# Patient Record
Sex: Female | Born: 1975 | Race: Black or African American | Hispanic: No | Marital: Single | Smoking: Never smoker
Health system: Southern US, Community
[De-identification: ages and names within clinical notes are randomized; demographics above are authoritative.]

## PROBLEM LIST (undated history)

## (undated) ENCOUNTER — Inpatient Hospital Stay (HOSPITAL_COMMUNITY): Payer: Self-pay

## (undated) DIAGNOSIS — R87629 Unspecified abnormal cytological findings in specimens from vagina: Secondary | ICD-10-CM

## (undated) DIAGNOSIS — I10 Essential (primary) hypertension: Secondary | ICD-10-CM

## (undated) HISTORY — DX: Unspecified abnormal cytological findings in specimens from vagina: R87.629

---

## 1998-04-17 ENCOUNTER — Emergency Department (HOSPITAL_COMMUNITY): Admission: EM | Admit: 1998-04-17 | Discharge: 1998-04-17 | Payer: Self-pay | Admitting: Emergency Medicine

## 2006-03-31 ENCOUNTER — Emergency Department (HOSPITAL_COMMUNITY): Admission: EM | Admit: 2006-03-31 | Discharge: 2006-03-31 | Payer: Self-pay | Admitting: Family Medicine

## 2006-05-14 ENCOUNTER — Ambulatory Visit (HOSPITAL_BASED_OUTPATIENT_CLINIC_OR_DEPARTMENT_OTHER): Admission: RE | Admit: 2006-05-14 | Discharge: 2006-05-14 | Payer: Self-pay | Admitting: Specialist

## 2006-05-14 ENCOUNTER — Encounter (INDEPENDENT_AMBULATORY_CARE_PROVIDER_SITE_OTHER): Payer: Self-pay | Admitting: Specialist

## 2006-10-23 HISTORY — PX: LIPOMA EXCISION: SHX5283

## 2007-03-09 ENCOUNTER — Emergency Department (HOSPITAL_COMMUNITY): Admission: EM | Admit: 2007-03-09 | Discharge: 2007-03-09 | Payer: Self-pay | Admitting: Family Medicine

## 2007-12-04 ENCOUNTER — Emergency Department (HOSPITAL_COMMUNITY): Admission: EM | Admit: 2007-12-04 | Discharge: 2007-12-04 | Payer: Self-pay | Admitting: Family Medicine

## 2008-01-31 ENCOUNTER — Emergency Department (HOSPITAL_COMMUNITY): Admission: EM | Admit: 2008-01-31 | Discharge: 2008-01-31 | Payer: Self-pay | Admitting: Emergency Medicine

## 2008-04-12 ENCOUNTER — Emergency Department (HOSPITAL_COMMUNITY): Admission: EM | Admit: 2008-04-12 | Discharge: 2008-04-12 | Payer: Self-pay | Admitting: Emergency Medicine

## 2008-04-17 ENCOUNTER — Emergency Department (HOSPITAL_COMMUNITY): Admission: EM | Admit: 2008-04-17 | Discharge: 2008-04-17 | Payer: Self-pay | Admitting: Emergency Medicine

## 2008-10-05 ENCOUNTER — Emergency Department (HOSPITAL_COMMUNITY): Admission: EM | Admit: 2008-10-05 | Discharge: 2008-10-06 | Payer: Self-pay | Admitting: Emergency Medicine

## 2009-02-24 ENCOUNTER — Encounter: Admission: RE | Admit: 2009-02-24 | Discharge: 2009-02-24 | Payer: Self-pay | Admitting: Internal Medicine

## 2010-03-24 ENCOUNTER — Emergency Department (HOSPITAL_COMMUNITY): Admission: EM | Admit: 2010-03-24 | Discharge: 2010-03-24 | Payer: Self-pay | Admitting: Emergency Medicine

## 2010-06-04 ENCOUNTER — Emergency Department (HOSPITAL_COMMUNITY): Admission: EM | Admit: 2010-06-04 | Discharge: 2010-06-04 | Payer: Self-pay | Admitting: Emergency Medicine

## 2010-08-10 ENCOUNTER — Encounter: Admission: RE | Admit: 2010-08-10 | Discharge: 2010-08-10 | Payer: Self-pay | Admitting: Internal Medicine

## 2011-01-06 LAB — POCT PREGNANCY, URINE: Preg Test, Ur: NEGATIVE

## 2011-01-06 LAB — URINALYSIS, ROUTINE W REFLEX MICROSCOPIC
Bilirubin Urine: NEGATIVE
Nitrite: NEGATIVE
Specific Gravity, Urine: 1.018 (ref 1.005–1.030)
Urobilinogen, UA: 1 mg/dL (ref 0.0–1.0)
pH: 6 (ref 5.0–8.0)

## 2011-01-06 LAB — URINE MICROSCOPIC-ADD ON

## 2011-03-10 NOTE — Op Note (Signed)
NAMENEMA, OATLEY              ACCOUNT NO.:  [id number]   MEDICAL RECORD NO.:  [id number]          PATIENT TYPE:  AMB   LOCATION:  DSC                          FACILITY:  [facility]   PHYSICIAN:  Earvin Hansen L. Truesdale, M.D.DATE OF BIRTH:  1976-05-13   DATE OF PROCEDURE:  05/14/2006  DATE OF DISCHARGE:                                 OPERATIVE REPORT   A 35 year old lady with enlarging mass around her right forehead area  measuring approximately 4 x 3.5 cm, increased growth, pain and irritation.   PROCEDURE PLANNED:  Excision of the area with plastic reconstruction.   ANESTHESIA:  General.   The patient underwent general anesthesia intubated orally.  Prep was done to  her face, neck and scalp areas with Betadine soap and solution.  Walled off  with sterile towels and drape so as to make a sterile field.  1% Xylocaine  with epinephrine injected locally, a total of 20 cc.  Curvilinear incision  was made over the hair line area.  I was able to then dissect under the  periosteum and then we made a plane between the skin and the mass and  dissected over with the Metzenbaum scissors.  After proper hemostasis, I was  able to use a retractor to lift under to control bleeders, which we  controlled with the Bovie unit or coagulation.  After this irrigation was  done and after being sure it was a good hemostasis, the flaps were then re-  closed with 3-0 Monocryl x2 layers, subdermal suture of 5-0 Monocryl and  then a running subcuticular stitch of 5-0 Monocryl.  I left a small area of  the wound open approximately one-fourth inch laterally to allow any drainage  from within.  A pressure dressing was used using boluses of 4 x 4's, ABDs  and Kerlix and Ace wrap.  She withstood the procedure very well.  Estimated  blood loss less than 50 mL.  Complications none.      Yaakov Guthrie. Shon Hough, M.D.  Electronically Signed     GLT/MEDQ  D:  05/14/2006  T:  05/14/2006  Job:  [id number]

## 2011-07-28 LAB — URINALYSIS, ROUTINE W REFLEX MICROSCOPIC
Glucose, UA: NEGATIVE mg/dL
Nitrite: NEGATIVE
Specific Gravity, Urine: 1.009 (ref 1.005–1.030)
pH: 7 (ref 5.0–8.0)

## 2011-07-28 LAB — POCT PREGNANCY, URINE: Preg Test, Ur: NEGATIVE

## 2011-10-24 NOTE — L&D Delivery Note (Signed)
Delivery Note At 6:00 PM a viable female was delivered via Vaginal, Spontaneous Delivery (Presentation: ROA;  ).  APGAR: 6-8, ; weight:  1857 gms.  Placenta status: intact , .  Cord: 3 vessel  with the following complications: none .  Cord pH: 7.35  Anesthesia: Epidural  Episiotomy: none Lacerations: none Suture Repair: none Est. Blood Loss (mL): 350  Mom to postpartum.  Baby to NICU.  Naryah Clenney A 04/29/2012, 6:18 PM

## 2011-10-25 ENCOUNTER — Encounter: Payer: Self-pay | Admitting: *Deleted

## 2011-10-25 ENCOUNTER — Emergency Department (HOSPITAL_COMMUNITY)
Admission: EM | Admit: 2011-10-25 | Discharge: 2011-10-25 | Disposition: A | Discharge status: Home / Self Care | Payer: BC Managed Care – PPO | Attending: Emergency Medicine | Admitting: Emergency Medicine

## 2011-10-25 DIAGNOSIS — N390 Urinary tract infection, site not specified: Secondary | ICD-10-CM | POA: Insufficient documentation

## 2011-10-25 DIAGNOSIS — M79603 Pain in arm, unspecified: Secondary | ICD-10-CM

## 2011-10-25 DIAGNOSIS — Z331 Pregnant state, incidental: Secondary | ICD-10-CM

## 2011-10-25 DIAGNOSIS — O2340 Unspecified infection of urinary tract in pregnancy, unspecified trimester: Secondary | ICD-10-CM

## 2011-10-25 DIAGNOSIS — M79609 Pain in unspecified limb: Secondary | ICD-10-CM | POA: Insufficient documentation

## 2011-10-25 DIAGNOSIS — O2390 Unspecified genitourinary tract infection in pregnancy, unspecified trimester: Secondary | ICD-10-CM | POA: Insufficient documentation

## 2011-10-25 DIAGNOSIS — R209 Unspecified disturbances of skin sensation: Secondary | ICD-10-CM | POA: Insufficient documentation

## 2011-10-25 DIAGNOSIS — M25519 Pain in unspecified shoulder: Secondary | ICD-10-CM | POA: Insufficient documentation

## 2011-10-25 LAB — BASIC METABOLIC PANEL
BUN: 8 mg/dL (ref 6–23)
CO2: 22 mEq/L (ref 19–32)
Calcium: 9.3 mg/dL (ref 8.4–10.5)
Chloride: 109 mEq/L (ref 96–112)
Creatinine, Ser: 0.71 mg/dL (ref 0.50–1.10)
GFR calc Af Amer: >90 mL/min (ref >90)
GFR calc non Af Amer: >90 mL/min (ref >90)
Glucose, Bld: 98 mg/dL (ref 70–99)
Potassium: 3.8 mEq/L (ref 3.5–5.1)
Sodium: 138 mEq/L (ref 135–145)

## 2011-10-25 LAB — URINE MICROSCOPIC-ADD ON

## 2011-10-25 LAB — DIFFERENTIAL
Basophils Absolute: 0 10*3/uL (ref 0.0–0.1)
Basophils Relative: 0 % (ref 0–1)
Eosinophils Absolute: 0.2 10*3/uL (ref 0.0–0.7)
Eosinophils Relative: 1 % (ref 0–5)
Lymphocytes Relative: 32 % (ref 12–46)
Lymphs Abs: 4.1 10*3/uL — ABNORMAL HIGH (ref 0.7–4.0)
Monocytes Absolute: 0.7 10*3/uL (ref 0.1–1.0)
Monocytes Relative: 6 % (ref 3–12)
Neutro Abs: 7.7 10*3/uL (ref 1.7–7.7)
Neutrophils Relative %: 60 % (ref 43–77)

## 2011-10-25 LAB — URINALYSIS, ROUTINE W REFLEX MICROSCOPIC
Bilirubin Urine: NEGATIVE
Glucose, UA: NEGATIVE mg/dL
Hgb urine dipstick: NEGATIVE
Ketones, ur: NEGATIVE mg/dL
Nitrite: NEGATIVE
Protein, ur: NEGATIVE mg/dL
Specific Gravity, Urine: 1.021 (ref 1.005–1.030)
Urobilinogen, UA: 1 mg/dL (ref 0.0–1.0)
pH: 5.5 (ref 5.0–8.0)

## 2011-10-25 LAB — CBC
HCT: 39.2 % (ref 36.0–46.0)
Hemoglobin: 13.1 g/dL (ref 12.0–15.0)
MCH: 28.9 pg (ref 26.0–34.0)
MCHC: 33.4 g/dL (ref 30.0–36.0)
MCV: 86.5 fL (ref 78.0–100.0)
Platelets: 319 10*3/uL (ref 150–400)
RBC: 4.53 MIL/uL (ref 3.87–5.11)
RDW: 14 % (ref 11.5–15.5)
WBC: 12.8 10*3/uL — ABNORMAL HIGH (ref 4.0–10.5)

## 2011-10-25 LAB — POCT PREGNANCY, URINE: Preg Test, Ur: POSITIVE

## 2011-10-25 MED ORDER — NITROFURANTOIN MONOHYD MACRO 100 MG PO CAPS: 100.0000 mg | ORAL_CAPSULE | Freq: Two times a day (BID) | ORAL | Status: AC

## 2011-10-25 MED ORDER — HYDROCODONE-ACETAMINOPHEN 5-325 MG PO TABS: 1.0000 | ORAL_TABLET | ORAL | Status: AC | PRN

## 2011-10-25 MED ORDER — HYDROCODONE-ACETAMINOPHEN 5-325 MG PO TABS
1.0000 | ORAL_TABLET | Freq: Once | ORAL | Status: AC
  Administered 2011-10-25: 1 via ORAL
  Filled 2011-10-25: qty 1

## 2011-10-25 MED ORDER — IBUPROFEN 800 MG PO TABS
800.0000 mg | ORAL_TABLET | Freq: Once | ORAL | Status: AC
  Administered 2011-10-25: 800 mg via ORAL
  Filled 2011-10-25: qty 1

## 2011-10-25 NOTE — ED Provider Notes (Signed)
History     CSN: [id number]  Arrival date & time 10/25/11  1734   First MD Initiated Contact with Patient 10/25/11 2059      Chief Complaint  Patient presents with  . Tingling  . Numbness  . Extremity Pain    HPI: Patient is a 36 y.o. female presenting with extremity pain.  Extremity Pain This is a new problem. The current episode started in the past 7 days. The problem occurs 2 to 4 times per day. The problem has been gradually worsening. She has tried NSAIDs for the symptoms.  Patient reports one-week history of bilateral hand pain that radiates up to each shoulder and is associated with numbness and tingling. This only happens when she is in a lying position and often wakes her up from sleep. States she has been taking ibuprofen but it does not help. Denies neck pain or recent injury of any kind. States her last menstrual period was 09/12/2011 History reviewed. No pertinent past medical history.  History reviewed. No pertinent past surgical history.  History reviewed. No pertinent family history.  History  Substance Use Topics  . Smoking status: Never Smoker   . Smokeless tobacco: Not on file  . Alcohol Use: No    OB History    Grav Para Term Preterm Abortions TAB SAB Ect Mult Living                  Review of Systems  Constitutional: Negative.   HENT: Negative.   Eyes: Negative.   Respiratory: Negative.   Cardiovascular: Negative.   Gastrointestinal: Negative.   Genitourinary: Negative.   Musculoskeletal: Negative.   Skin: Negative.   Neurological: Negative.   Hematological: Negative.   Psychiatric/Behavioral: Negative.     Allergies  Review of patient's allergies indicates no known allergies.  Home Medications   Current Outpatient Rx  Name Route Sig Dispense Refill  . IBUPROFEN 200 MG PO TABS Oral Take 1,000 mg by mouth every 6 (six) hours as needed. For pain       BP 135/82  Pulse 93  Temp(Src) 98.5 F (36.9 C) (Oral)  Resp 18  SpO2  98%  Physical Exam  Constitutional: She is oriented to person, place, and time. She appears well-developed and well-nourished.  HENT:  Head: Normocephalic and atraumatic.  Eyes: Conjunctivae are normal.  Neck: Neck supple.  Cardiovascular: Normal rate and regular rhythm.   Pulmonary/Chest: Effort normal and breath sounds normal.  Abdominal: Soft. Bowel sounds are normal.  Musculoskeletal: Normal range of motion.       Cervical back: She exhibits normal range of motion, no tenderness and no bony tenderness.  Neurological: She is alert and oriented to person, place, and time. She has normal strength. She displays no tremor and normal reflexes. She displays a negative Romberg sign. Coordination abnormal.  Skin: Skin is warm and dry. No erythema.  Psychiatric: She has a normal mood and affect.    ED Course  Procedures findings and clinical impression discussed with patient. Will plan to treat for UTI, prescribe short course of medication for pain and encourage patient to follow up with her primary care physician for further evaluation of her bilateral upper extremity pain, numbness and tingling and newly diagnosed pregnancy.  Labs Reviewed  CBC - Abnormal; Notable for the following:    WBC 12.8 (*)    All other components within normal limits  DIFFERENTIAL - Abnormal; Notable for the following:    Lymphs Abs 4.1 (*)  All other components within normal limits  URINALYSIS, ROUTINE W REFLEX MICROSCOPIC - Abnormal; Notable for the following:    APPearance CLOUDY (*)    Leukocytes, UA MODERATE (*)    All other components within normal limits  URINE MICROSCOPIC-ADD ON - Abnormal; Notable for the following:    Squamous Epithelial / LPF FEW (*)    Bacteria, UA FEW (*)    All other components within normal limits  POCT PREGNANCY, URINE  BASIC METABOLIC PANEL  POCT PREGNANCY, URINE   No results found.   No diagnosis found.    MDM  UTI Early pregnancy Upper extremity pain,  numbness and tingling      Leanne Chang, NP 10/25/11 2252

## 2011-10-25 NOTE — ED Notes (Signed)
C/o BUE numbness and tingling, shoulders to hands, both directions, L>R, 1st noticed 1 week ago on R, then 2d ago on L, intermittant, worse when lying down, some discomfort, radial brachial and carotid pulses equal and strong, grip strength equal and strong, denies dropping things or weakness, "it does seem a little harder to hold onto/ close hand around smaller things".

## 2011-10-25 NOTE — ED Notes (Addendum)
Pt reports for approx 1 week she has been waking up with numbness and tingling to bilateral hands. States in the last two days she has started to have pain also to her bilateral arms. Pt states this am when she woke up she had nubmness/tingling to toes. Pt states when it first started she would wake up and it take only couple of seconds for sensation to return. States today it took approx .neruro wnl at this time. gcs 15.  Denies any injury.

## 2011-10-26 ENCOUNTER — Encounter (HOSPITAL_COMMUNITY): Payer: Self-pay | Admitting: *Deleted

## 2011-10-26 ENCOUNTER — Inpatient Hospital Stay (HOSPITAL_COMMUNITY): Payer: BC Managed Care – PPO

## 2011-10-26 ENCOUNTER — Inpatient Hospital Stay (HOSPITAL_COMMUNITY)
Admission: AD | Admit: 2011-10-26 | Discharge: 2011-10-26 | Disposition: A | Discharge status: Home / Self Care | Payer: BC Managed Care – PPO | Source: Ambulatory Visit | Attending: Obstetrics & Gynecology | Admitting: Obstetrics & Gynecology

## 2011-10-26 DIAGNOSIS — R109 Unspecified abdominal pain: Secondary | ICD-10-CM | POA: Insufficient documentation

## 2011-10-26 DIAGNOSIS — N390 Urinary tract infection, site not specified: Secondary | ICD-10-CM | POA: Insufficient documentation

## 2011-10-26 DIAGNOSIS — O2340 Unspecified infection of urinary tract in pregnancy, unspecified trimester: Secondary | ICD-10-CM

## 2011-10-26 DIAGNOSIS — O2390 Unspecified genitourinary tract infection in pregnancy, unspecified trimester: Secondary | ICD-10-CM | POA: Insufficient documentation

## 2011-10-26 LAB — CBC
HCT: 39.1 % (ref 36.0–46.0)
MCV: 87.3 fL (ref 78.0–100.0)
Platelets: 312 10*3/uL (ref 150–400)
RBC: 4.48 MIL/uL (ref 3.87–5.11)
RDW: 13.9 % (ref 11.5–15.5)
WBC: 12.4 10*3/uL — ABNORMAL HIGH (ref 4.0–10.5)

## 2011-10-26 LAB — DIFFERENTIAL
Basophils Absolute: 0 10*3/uL (ref 0.0–0.1)
Lymphocytes Relative: 28 % (ref 12–46)
Lymphs Abs: 3.5 10*3/uL (ref 0.7–4.0)
Neutro Abs: 7.9 10*3/uL — ABNORMAL HIGH (ref 1.7–7.7)

## 2011-10-26 LAB — ABO/RH: ABO/RH(D): A POS

## 2011-10-26 LAB — HCG, QUANTITATIVE, PREGNANCY: hCG, Beta Chain, Quant, S: 3627 m[IU]/mL — ABNORMAL HIGH (ref <5)

## 2011-10-26 NOTE — Progress Notes (Signed)
D/C INSTR GIVEN TO PT BY NATALIE, NP- IN TRIAGE

## 2011-10-26 NOTE — Progress Notes (Signed)
irreg menses,   July, none, Aug, Sept , Oct x 2 times, Nov x 3 day, none in Dec

## 2011-10-26 NOTE — ED Provider Notes (Signed)
History     Chief Complaint  Patient presents with  . Abdominal Pain  . Urinary Tract Infection   HPI 36 y.o. G1P0 at unknown EGA with low abd pain. Seen yesterday and diagnosed with UTI, started Macrobid today. No vaginal bleeding.    No past medical history on file.  No past surgical history on file.  No family history on file.  History  Substance Use Topics  . Smoking status: Never Smoker   . Smokeless tobacco: Not on file  . Alcohol Use: No    Allergies: No Known Allergies  No prescriptions prior to admission    Review of Systems  Constitutional: Negative.   Respiratory: Negative.   Cardiovascular: Negative.   Gastrointestinal: Positive for abdominal pain. Negative for nausea, vomiting, diarrhea and constipation.  Genitourinary: Negative for dysuria, urgency, frequency, hematuria and flank pain.       Negative for vaginal bleeding, vaginal discharge, dyspareunia  Musculoskeletal: Negative.   Neurological: Negative.   Psychiatric/Behavioral: Negative.    Physical Exam   Blood pressure 158/89, pulse 105, temperature 98.5 F (36.9 C), resp. rate 20, last menstrual period 09/06/2011.  Physical Exam  Nursing note and vitals reviewed. Constitutional: She is oriented to person, place, and time. She appears well-developed and well-nourished. No distress.  Cardiovascular: Normal rate.   Respiratory: Effort normal.  GI: Soft. There is no tenderness.  Musculoskeletal: Normal range of motion.  Neurological: She is alert and oriented to person, place, and time.  Skin: Skin is warm and dry.  Psychiatric: She has a normal mood and affect.    MAU Course  Procedures Results for orders placed during the hospital encounter of 10/26/11 (from the past 24 hour(s))  CBC     Status: Abnormal   Collection Time   10/26/11  7:05 PM      Component Value Range   WBC 12.4 (*) 4.0 - 10.5 (K/uL)   RBC 4.48  3.87 - 5.11 (MIL/uL)   Hemoglobin 13.4  12.0 - 15.0 (g/dL)   HCT 16.1   09.6 - 04.5 (%)   MCV 87.3  78.0 - 100.0 (fL)   MCH 29.9  26.0 - 34.0 (pg)   MCHC 34.3  30.0 - 36.0 (g/dL)   RDW 40.9  81.1 - 91.4 (%)   Platelets 312  150 - 400 (K/uL)  DIFFERENTIAL     Status: Abnormal   Collection Time   10/26/11  7:05 PM      Component Value Range   Neutrophils Relative 64  43 - 77 (%)   Neutro Abs 7.9 (*) 1.7 - 7.7 (K/uL)   Lymphocytes Relative 28  12 - 46 (%)   Lymphs Abs 3.5  0.7 - 4.0 (K/uL)   Monocytes Relative 7  3 - 12 (%)   Monocytes Absolute 0.9  0.1 - 1.0 (K/uL)   Eosinophils Relative 1  0 - 5 (%)   Eosinophils Absolute 0.2  0.0 - 0.7 (K/uL)   Basophils Relative 0  0 - 1 (%)   Basophils Absolute 0.0  0.0 - 0.1 (K/uL)  HCG, QUANTITATIVE, PREGNANCY     Status: Abnormal   Collection Time   10/26/11  7:05 PM      Component Value Range   hCG, Beta Chain, Quant, S 3627 (*) <5 (mIU/mL)  ABO/RH     Status: Normal   Collection Time   10/26/11  7:05 PM      Component Value Range   ABO/RH(D) A POS  U/S: + IUGS and yolk sac, 5w4 day size, no fetal pole seen  Assessment and Plan  36 y.o. G1P0 at 5.[redacted] weeks EGA Abd pain in early pregnancy, likely related to UTI, encouraged to continue macrobid Urine culture sent Pregnancy verification letter given, start prenatal care ASAP  Braylinn Gulden 10/27/2011, 2:12 AM

## 2011-10-26 NOTE — ED Provider Notes (Signed)
Medical screening examination/treatment/procedure(s) were performed by non-physician practitioner and as supervising physician I was immediately available for consultation/collaboration.  Raeford Razor, MD 10/26/11 1459

## 2011-10-26 NOTE — Progress Notes (Signed)
Seen yesterday at ED, Dx UTI and Pregnant, has taken 1 dose of antibx @ 2p , today, lower abd pain different today, and hurts when walk

## 2011-10-26 NOTE — Progress Notes (Signed)
WHILE PT IN TRIAGE- RE-ASSESSED- SAID NO BLEEDING.  PAIN - 6   . HAD LABS DRAWN.

## 2011-10-26 NOTE — Plan of Care (Signed)
Called x 2 by lab, all lobbies checked without finding pt

## 2011-10-27 NOTE — ED Provider Notes (Signed)
Attestation of Attending Supervision of Advanced Practitioner: Evaluation and management procedures were performed by the PA/NP/CNM/OB Fellow under my supervision/collaboration. Chart reviewed, and agree with management and plan.  Avagail Whittlesey, M.D. 10/27/2011 6:37 AM   

## 2011-10-28 LAB — URINE CULTURE: Culture  Setup Time: 201301040211

## 2011-11-19 ENCOUNTER — Inpatient Hospital Stay (HOSPITAL_COMMUNITY)
Admission: AD | Admit: 2011-11-19 | Discharge: 2011-11-19 | Disposition: A | Discharge status: Home / Self Care | Payer: BC Managed Care – PPO | Source: Ambulatory Visit | Attending: Obstetrics & Gynecology | Admitting: Obstetrics & Gynecology

## 2011-11-19 ENCOUNTER — Inpatient Hospital Stay (HOSPITAL_COMMUNITY): Payer: BC Managed Care – PPO

## 2011-11-19 ENCOUNTER — Encounter (HOSPITAL_COMMUNITY): Payer: Self-pay | Admitting: *Deleted

## 2011-11-19 DIAGNOSIS — R109 Unspecified abdominal pain: Secondary | ICD-10-CM | POA: Insufficient documentation

## 2011-11-19 DIAGNOSIS — O26859 Spotting complicating pregnancy, unspecified trimester: Secondary | ICD-10-CM | POA: Insufficient documentation

## 2011-11-19 DIAGNOSIS — N949 Unspecified condition associated with female genital organs and menstrual cycle: Secondary | ICD-10-CM

## 2011-11-19 DIAGNOSIS — M545 Low back pain, unspecified: Secondary | ICD-10-CM | POA: Insufficient documentation

## 2011-11-19 DIAGNOSIS — O4590 Premature separation of placenta, unspecified, unspecified trimester: Secondary | ICD-10-CM

## 2011-11-19 DIAGNOSIS — O418X90 Other specified disorders of amniotic fluid and membranes, unspecified trimester, not applicable or unspecified: Secondary | ICD-10-CM

## 2011-11-19 LAB — WET PREP, GENITAL
Clue Cells Wet Prep HPF POC: NONE SEEN
Trich, Wet Prep: NONE SEEN

## 2011-11-19 LAB — URINALYSIS, ROUTINE W REFLEX MICROSCOPIC
Bilirubin Urine: NEGATIVE
Glucose, UA: NEGATIVE mg/dL
Hgb urine dipstick: NEGATIVE
Ketones, ur: NEGATIVE mg/dL
Nitrite: NEGATIVE
Specific Gravity, Urine: 1.02 (ref 1.005–1.030)
pH: 7 (ref 5.0–8.0)

## 2011-11-19 MED ORDER — IBUPROFEN 600 MG PO TABS
600.0000 mg | ORAL_TABLET | Freq: Four times a day (QID) | ORAL | Status: DC | PRN
Start: 2011-11-19 — End: 2014-12-16

## 2011-11-19 NOTE — Progress Notes (Signed)
Pt G3 P2, LMP 09/10/2011, having lower abd and back pain and a brownish discharge today.

## 2011-11-19 NOTE — ED Provider Notes (Signed)
History   Deanna Sparks is a 36 y.o. year old G3P2 female at [redacted]w[redacted]d weeks gestation by [place] 10/26/11 who presents to MAU reporting low abd and low back pain and brown discharge. [place] 10/26/11 showed 5.3 week GS w/ YS, but no FP. S<D. Has not had F/U US. She rates her pain 2/10. She reports mild N/V through pregnancy w/out worsening.   CSN: [id number]  Arrival date & time 11/19/11  1947   None     Chief Complaint  Patient presents with  . Abdominal Pain  . Vaginal Discharge    (Consider location/radiation/quality/duration/timing/severity/associated sxs/prior treatment) HPI  Past Medical History  Diagnosis Date  . No pertinent past medical history     Past Surgical History  Procedure Date  . Cesarean section     Family History  Problem Relation Age of Onset  . Hypertension Mother     History  Substance Use Topics  . Smoking status: Never Smoker   . Smokeless tobacco: Not on file  . Alcohol Use: No    OB History    Grav Para Term Preterm Abortions TAB SAB Ect Mult Living   3 2        2       Review of Systems  Constitutional: Negative for fever, chills and appetite change.  Gastrointestinal: Positive for nausea (ongoing N/V of pregnancy), vomiting and abdominal pain. Negative for diarrhea, constipation and abdominal distention.  Genitourinary: Positive for vaginal bleeding. Negative for dysuria, frequency, hematuria, flank pain and vaginal discharge.  :   Allergies  Review of patient's allergies indicates no known allergies.  Home Medications  No current outpatient prescriptions on file.  BP 143/83  Pulse 94  Temp(Src) 98.2 F (36.8 C) (Oral)  Resp 16  Ht 5' 8.5" (1.74 m)  Wt 103.964 kg (229 lb 3.2 oz)  BMI 34.34 kg/m2  LMP 09/06/2011  Physical Exam  Constitutional: She is oriented to person, place, and time. She appears well-developed and well-nourished. No distress.  Cardiovascular: Normal rate.   Pulmonary/Chest: Effort normal.  Abdominal: Soft. Bowel  sounds are normal. She exhibits no distension and no mass. There is tenderness (suprapubic and right groin). There is no rebound and no guarding.  Genitourinary: There is no lesion on the right labia. There is no lesion on the left labia. Uterus is enlarged. Uterus is not tender. Cervix exhibits no motion tenderness, no discharge and no friability. Right adnexum displays no tenderness. Left adnexum displays no tenderness. There is bleeding (scant brown blood in os) around the vagina. No vaginal discharge found.  Neurological: She is alert and oriented to person, place, and time.  Skin: Skin is warm and dry.  Psychiatric: She has a normal mood and affect.    ED Course  Procedures (including critical care time)  Results for orders placed during the hospital encounter of 11/19/11 (from the past 24 hour(s))  URINALYSIS, ROUTINE W REFLEX MICROSCOPIC     Status: Normal   Collection Time   11/19/11  8:03 PM      Component Value Range   Color, Urine YELLOW  YELLOW    APPearance CLEAR  CLEAR    Specific Gravity, Urine 1.020  1.005 - 1.030    pH 7.0  5.0 - 8.0    Glucose, UA NEGATIVE  NEGATIVE (mg/dL)   Hgb urine dipstick NEGATIVE  NEGATIVE    Bilirubin Urine NEGATIVE  NEGATIVE    Ketones, ur NEGATIVE  NEGATIVE (mg/dL)   Protein, ur NEGATIVE  NEGATIVE (  mg/dL)   Urobilinogen, UA 0.2  0.0 - 1.0 (mg/dL)   Nitrite NEGATIVE  NEGATIVE    Leukocytes, UA NEGATIVE  NEGATIVE   WET PREP, GENITAL     Status: Abnormal   Collection Time   11/19/11  8:50 PM      Component Value Range   Yeast, Wet Prep NONE SEEN  NONE SEEN    Trich, Wet Prep NONE SEEN  NONE SEEN    Clue Cells, Wet Prep NONE SEEN  NONE SEEN    WBC, Wet Prep HPF POC FEW (*) NONE SEEN      US Ob Transvaginal  11/19/2011  *RADIOLOGY REPORT*  Clinical Data: Right lower quadrant pain and spotting.  Estimated gestational age by LMP is 10 weeks 0 days.  TRANSVAGINAL OB ULTRASOUND  Technique:  Transvaginal ultrasound was performed for evaluation  of the gestation as well as the maternal uterus and adnexal regions.  Comparison: 10/26/2011  Findings: There is a single intrauterine gestational sac.  The fetal pole and yolk sac are visualized.  Fetal motion and cardiac activity are visualized in real time imaging.  Fetal heart rate is measured at 183 beats per minute.  A small subchorionic hemorrhage is noted inferiorly measuring about 0.8 x 1.7 cm.  Fetal crown-rump length measures 2.3 cm consistent with an estimated gestational age of [redacted] weeks 1 day.  Estimated delivery date is 06/22/2012.  This represent per pre interval growth since the previous comparison study.  Both ovaries are visualized appear symmetrical.  Trace amount of free fluid in the pelvis.  IMPRESSION: Single intrauterine pregnancy.  Estimated gestational age by crown- rump length is 9 weeks 1 day.  Small subchorionic hemorrhage and minimal free pelvic fluid are noted.  Original Report Authenticated By: Marlon Pel, M.D.     1. Subchorionic hemorrhage of placenta   2. Round ligament pain    Will use new EDD 06/22/12.   MDM  D/C home F/U as scheduled at [facility] or MAU PRN Bleeding precautions  Dorathy Kinsman 11/19/2011 10:43 PM

## 2011-11-20 LAB — GC/CHLAMYDIA PROBE AMP, GENITAL
Chlamydia, DNA Probe: NEGATIVE
GC Probe Amp, Genital: NEGATIVE

## 2011-11-26 NOTE — ED Provider Notes (Signed)
Agree with above note.  Deanna Sparks H. 11/26/2011 7:58 PM

## 2011-12-05 ENCOUNTER — Other Ambulatory Visit: Payer: Self-pay

## 2011-12-05 LAB — OB RESULTS CONSOLE HEPATITIS B SURFACE ANTIGEN: Hepatitis B Surface Ag: NEGATIVE

## 2011-12-05 LAB — OB RESULTS CONSOLE ANTIBODY SCREEN: Antibody Screen: NEGATIVE

## 2012-01-04 ENCOUNTER — Other Ambulatory Visit: Payer: Self-pay | Admitting: Obstetrics

## 2012-01-04 DIAGNOSIS — O09529 Supervision of elderly multigravida, unspecified trimester: Secondary | ICD-10-CM

## 2012-01-04 DIAGNOSIS — O139 Gestational [pregnancy-induced] hypertension without significant proteinuria, unspecified trimester: Secondary | ICD-10-CM

## 2012-01-09 ENCOUNTER — Ambulatory Visit (HOSPITAL_COMMUNITY)
Admission: RE | Admit: 2012-01-09 | Discharge: 2012-01-09 | Disposition: A | Discharge status: Home / Self Care | Payer: BC Managed Care – PPO | Source: Ambulatory Visit | Attending: Obstetrics | Admitting: Obstetrics

## 2012-01-09 ENCOUNTER — Other Ambulatory Visit: Payer: Self-pay

## 2012-01-09 DIAGNOSIS — O09529 Supervision of elderly multigravida, unspecified trimester: Secondary | ICD-10-CM | POA: Insufficient documentation

## 2012-01-09 DIAGNOSIS — O10019 Pre-existing essential hypertension complicating pregnancy, unspecified trimester: Secondary | ICD-10-CM | POA: Insufficient documentation

## 2012-01-09 DIAGNOSIS — Z363 Encounter for antenatal screening for malformations: Secondary | ICD-10-CM | POA: Insufficient documentation

## 2012-01-09 DIAGNOSIS — O358XX0 Maternal care for other (suspected) fetal abnormality and damage, not applicable or unspecified: Secondary | ICD-10-CM

## 2012-01-09 DIAGNOSIS — O34219 Maternal care for unspecified type scar from previous cesarean delivery: Secondary | ICD-10-CM | POA: Insufficient documentation

## 2012-01-09 DIAGNOSIS — Z1389 Encounter for screening for other disorder: Secondary | ICD-10-CM | POA: Insufficient documentation

## 2012-01-09 DIAGNOSIS — O139 Gestational [pregnancy-induced] hypertension without significant proteinuria, unspecified trimester: Secondary | ICD-10-CM

## 2012-01-09 NOTE — Progress Notes (Signed)
Genetic Counseling  High-Risk Gestation Note  Appointment Date:  01/09/2012 Referred By: Brock Bad, MD Date of Birth:  [birth year] Partner:  Deanna Sparks    Pregnancy History: G3P2 Estimated Date of Delivery: 06/22/12 Estimated Gestational Age: [redacted]w[redacted]d Attending: Particia Nearing, MD   Deanna Sparks and her partner, Deanna Sparks, were seen for genetic counseling because of a maternal age of 36 y.o. at delivery.     They were counseled regarding maternal age and the association with risk for chromosome conditions due to nondisjunction with aging of the ova.   We reviewed chromosomes, nondisjunction, and the associated 1 in 59 risk for fetal aneuploidy at [redacted]w[redacted]d gestation related to a maternal age of 36 at delivery.  They were counseled that the risk for aneuploidy decreases as gestational age increases, accounting for those pregnancies which spontaneously abort.  We specifically discussed Down syndrome (trisomy 35), trisomies 76 and 22, and sex chromosome aneuploidies (47,XXX and 47,XXY) including the common features and prognoses of each.   We reviewed available screening and diagnostic options.  Regarding screening tests, we discussed the options of Quad screen and ultrasound.  They understand that screening tests are used to modify a patient's a priori risk for aneuploidy, typically based on age.  This estimate provides a pregnancy specific risk assessment. We discussed another type of screening test, noninvasive prenatal testing (NIPT), which utilizes cell free fetal DNA found in the maternal circulation. This test is not diagnostic for chromosome conditions, but can provide information regarding the presence or absence of extra fetal DNA for chromosomes 13, 18 and 21. Thus, it would not identify or rule out all fetal aneuploidy. The reported detection rate is greater than 99% for Trisomy 21, greater than 97% for Trisomy 18, and is approximately 80% (8 out of 10) for Trisomy 13. The false  positive rate is reported to be less than 1% for any of these conditions. We also reviewed the availability of diagnostic options including amniocentesis.  We discussed the risks, limitations, and benefits of each.    After consideration of all the options, and a clear understanding of the newness and limitations of NIPT, Deanna Sparks elected to proceed with cell free fetal DNA testing ([facility]) and ultrasound. The results of [facility] testing will be available in 8-10 days and will be forwarded to your office when we receive them.  She understands that ultrasound and screening cannot rule out all birth defects or genetic syndromes. The patient was advised of this limitation and states she still does not want diagnostic testing at this time.  However, they were counseled that 50-80% of fetuses with Down syndrome and up to 90% of fetuses with trisomies 13 and 18, when well visualized, have detectable anomalies or soft markers by ultrasound.   Deanna Sparks was provided with written information regarding sickle cell anemia (SCA) including the carrier frequency and incidence in the African-American population, the availability of carrier testing and prenatal diagnosis if indicated.  In addition, we discussed that hemoglobinopathies are routinely screened for as part of the [place] newborn screening panel.  She declined hemoglobin electrophoresis today.  Both family histories were reviewed and found to be noncontributory for birth defects, mental retardation, and known genetic conditions. Without further information regarding the provided family history, an accurate genetic risk cannot be calculated. Further genetic counseling is warranted if more information is obtained.  Deanna Sparks denied exposure to environmental toxins or chemical agents. She denied the use of  alcohol, tobacco or street drugs. She denied significant viral illnesses during the course of her pregnancy. Her medical and surgical  histories were contributory for hypertension, for which she is taking Aldomet. Deanna Sparks was also seen for MFM consultation regarding this history. Please see separate note for detailed discussion.    I counseled this couple regarding the above risks and available options.  The approximate face-to-face time with the genetic counselor was 40 minutes.  Quinn Plowman, MS,  Patent attorney

## 2012-01-19 ENCOUNTER — Telehealth (HOSPITAL_COMMUNITY): Payer: Self-pay | Admitting: MS"

## 2012-01-19 NOTE — Telephone Encounter (Signed)
Called Deanna Sparks to discuss her [facility]ony, cell free fetal DNA testing.  We reviewed that these are within normal limits, showing a less than 1 in 10,000 risk for trisomies 21, 18 and 13.  We reviewed that this testing identifies > 99% of pregnancies with trisomy 21, >97% of pregnancies with trisomy 36, and >80% with trisomy 77; the false positive rate is <0.1% for all conditions.  She understands that this testing does not identify all genetic conditions.  All questions were answered to her satisfaction, she was encouraged to call with additional questions or concerns.  Quinn Plowman, MS Patent attorney

## 2012-01-19 NOTE — Progress Notes (Signed)
MFM Note  Deanna Sparks is a 36 year old G3P2 AA female at 16+3 weeks who presents for consultation regarding chronic hypertension in pregnancy and genetic counseling for advanced maternal age. Deanna Sparks states that she has been followed for borderline hypertension but never officially diagnosed and never treated.  At her first prenatal visit her BP was 141/91. At that time, she was started on Aldomet 500 mg every 12 hours. At her next prenatal visit, her BP was 135/89 and today's BP was 126/85.   OB history: G1: 1997, NSVD at 41 weeks, female, 8+5, only problem was early cervical effacement at 7 months which resulted in several weeks at bedrest G2: 1998, primary C/S at 38 weeks, female, 5+1, intrauterine infection and newborn spent several days in NICU G3: current pregnancy, N&V in first trimester  Medical hx: HSV Surgical hx: C/S Medications: Aldomet 500 mg every 12 hours; Acyclovir 500 mg as needed; PNV Allergies: none Social hx: works full time as Lawyer Family hx: denies any congenital malformations and inheritable diseases/conditions  We discussed the potential complications of chronic hypertension and pregnancy for the mother and fetus: exacerbation of hypertension, superimposed preeclampsia, eclampsia, CNS adverse event, fetal growth restriction, preterm delivery, abruption and IUFD. However, in most cases, maternal and fetal outcomes are good.  Assessment: 1) IUP at 16+ weeks 2) Mild chronic hypertension controlled on Aldomet 3) HSV 4) S/P C/S at term 5) AMA - see genetic counseling letter  Recommendations: 1) Baseline renal and liver function tests including 24 hour urine collection for total protein 2) Serial USs for growth 3) Antenatal testing starting at 32 weeks 4) Deliver by 39 weeks 5) Goal: to maintain BPs between 140s-150s/80s-90s 6) Can increase Aldomet to a total of 2000 mgs; then add Labetalol or nifedipine as necessary 7) Observe closely for any s/s of superimposed  preeclampsia  It was a pleasure meeting [name]. Please call with any questions or concerns.  (Face-to-face consultation with patient: 30 min)

## 2012-01-25 ENCOUNTER — Ambulatory Visit (HOSPITAL_COMMUNITY): Payer: BC Managed Care – PPO

## 2012-02-06 ENCOUNTER — Ambulatory Visit (HOSPITAL_COMMUNITY)
Admission: RE | Admit: 2012-02-06 | Discharge: 2012-02-06 | Disposition: A | Discharge status: Home / Self Care | Payer: BC Managed Care – PPO | Source: Ambulatory Visit | Attending: Obstetrics | Admitting: Obstetrics

## 2012-02-06 VITALS — BP 120/79 | HR 98 | Wt 246.0 lb

## 2012-02-06 DIAGNOSIS — O09529 Supervision of elderly multigravida, unspecified trimester: Secondary | ICD-10-CM | POA: Insufficient documentation

## 2012-02-06 DIAGNOSIS — O34219 Maternal care for unspecified type scar from previous cesarean delivery: Secondary | ICD-10-CM | POA: Insufficient documentation

## 2012-02-06 DIAGNOSIS — O10019 Pre-existing essential hypertension complicating pregnancy, unspecified trimester: Secondary | ICD-10-CM | POA: Insufficient documentation

## 2012-02-06 DIAGNOSIS — O358XX0 Maternal care for other (suspected) fetal abnormality and damage, not applicable or unspecified: Secondary | ICD-10-CM

## 2012-03-05 ENCOUNTER — Ambulatory Visit (HOSPITAL_COMMUNITY)
Admission: RE | Admit: 2012-03-05 | Discharge: 2012-03-05 | Disposition: A | Discharge status: Home / Self Care | Payer: BC Managed Care – PPO | Source: Ambulatory Visit | Attending: Obstetrics | Admitting: Obstetrics

## 2012-03-05 VITALS — BP 140/84 | HR 102 | Wt 252.0 lb

## 2012-03-05 DIAGNOSIS — O10019 Pre-existing essential hypertension complicating pregnancy, unspecified trimester: Secondary | ICD-10-CM | POA: Insufficient documentation

## 2012-03-05 DIAGNOSIS — O09529 Supervision of elderly multigravida, unspecified trimester: Secondary | ICD-10-CM | POA: Insufficient documentation

## 2012-03-05 DIAGNOSIS — O34219 Maternal care for unspecified type scar from previous cesarean delivery: Secondary | ICD-10-CM | POA: Insufficient documentation

## 2012-03-11 ENCOUNTER — Inpatient Hospital Stay (HOSPITAL_COMMUNITY)
Admission: AD | Admit: 2012-03-11 | Discharge: 2012-03-11 | Disposition: A | Discharge status: Home / Self Care | Payer: BC Managed Care – PPO | Source: Ambulatory Visit | Attending: Obstetrics & Gynecology | Admitting: Obstetrics & Gynecology

## 2012-03-11 ENCOUNTER — Encounter (HOSPITAL_COMMUNITY): Payer: Self-pay

## 2012-03-11 DIAGNOSIS — N949 Unspecified condition associated with female genital organs and menstrual cycle: Secondary | ICD-10-CM

## 2012-03-11 DIAGNOSIS — O99891 Other specified diseases and conditions complicating pregnancy: Secondary | ICD-10-CM | POA: Insufficient documentation

## 2012-03-11 DIAGNOSIS — R109 Unspecified abdominal pain: Secondary | ICD-10-CM | POA: Insufficient documentation

## 2012-03-11 LAB — WET PREP, GENITAL
Clue Cells Wet Prep HPF POC: NONE SEEN
Trich, Wet Prep: NONE SEEN
Yeast Wet Prep HPF POC: NONE SEEN

## 2012-03-11 LAB — URINALYSIS, ROUTINE W REFLEX MICROSCOPIC
Bilirubin Urine: NEGATIVE
Ketones, ur: NEGATIVE mg/dL
Nitrite: NEGATIVE
Protein, ur: NEGATIVE mg/dL
pH: 6.5 (ref 5.0–8.0)

## 2012-03-11 NOTE — MAU Provider Note (Signed)
Chief Complaint:  Abdominal Pain    First Provider Initiated Contact with Patient 03/11/12 1947      Deanna Sparks is  36 y.o. [id number].  Patient's last menstrual period was 09/06/2011.. [redacted]w[redacted]d  She presents complaining of Abdominal Pain . Onset is described as intermittent and has been present since yesterday. Reports sharp lower abd pain, worse with movement. States Single episode of spotting on tissue after voiding earlier today. Denies recent intercourse. Reports good fetal movement.  Obstetrical/Gynecological History: OB History    Grav Para Term Preterm Abortions TAB SAB Ect Mult Living   3 2 2       2       Past Medical History: Past Medical History  Diagnosis Date  . Genital herpes     last outbreak 12/2011    Past Surgical History: Past Surgical History  Procedure Date  . Cesarean section     Family History: Family History  Problem Relation Age of Onset  . Hypertension Mother   . Anesthesia problems Neg Hx     Social History: History  Substance Use Topics  . Smoking status: Never Smoker   . Smokeless tobacco: Not on file  . Alcohol Use: No    Allergies: No Known Allergies  Prescriptions prior to admission  Medication Sig Dispense Refill  . metoprolol tartrate (LOPRESSOR) 25 MG tablet Take by mouth. Pt unsure of dosage      . Prenatal Vit-Fe Fumarate-FA (PRENATAL MULTIVITAMIN) TABS Take 1 tablet by mouth every morning.         Review of Systems - Negative except what has been reviewed in HPI  Physical Exam   Blood pressure 135/83, pulse 102, temperature 99 F (37.2 C), temperature source Oral, resp. rate 18, height 5\' 8"  (1.727 m), weight 253 lb 6.4 oz (114.941 kg), last menstrual period 09/06/2011, SpO2 100.00%.  General: General appearance - alert, well appearing, and in no distress, oriented to person, place, and time and overweight Mental status - alert, oriented to person, place, and time, normal mood, behavior, speech, dress, motor activity,  and thought processes, affect appropriate to mood Abdomen - gravid, non tender Focused Gynecological Exam: VULVA: normal appearing vulva with no masses, tenderness or lesions, VAGINA: normal appearing vagina with normal color and discharge, no lesions, no active bleeding, old bleeding or blood tinged discharge noted in vault CERVIX: normal appearing cervix without discharge or lesions, closed/thick/posterior FHR: 140, mod variability, Cat I for gest age Toco: no contractions Labs: Recent Results (from the past 24 hour(s))  URINALYSIS, ROUTINE W REFLEX MICROSCOPIC   Collection Time   03/11/12  6:45 PM      Component Value Range   Color, Urine YELLOW  YELLOW    APPearance CLEAR  CLEAR    Specific Gravity, Urine 1.020  1.005 - 1.030    pH 6.5  5.0 - 8.0    Glucose, UA NEGATIVE  NEGATIVE (mg/dL)   Hgb urine dipstick NEGATIVE  NEGATIVE    Bilirubin Urine NEGATIVE  NEGATIVE    Ketones, ur NEGATIVE  NEGATIVE (mg/dL)   Protein, ur NEGATIVE  NEGATIVE (mg/dL)   Urobilinogen, UA 1.0  0.0 - 1.0 (mg/dL)   Nitrite NEGATIVE  NEGATIVE    Leukocytes, UA NEGATIVE  NEGATIVE   WET PREP, GENITAL   Collection Time   03/11/12  7:54 PM      Component Value Range   Yeast Wet Prep HPF POC NONE SEEN  NONE SEEN    Trich, Wet Prep NONE SEEN  NONE SEEN    Clue Cells Wet Prep HPF POC NONE SEEN  NONE SEEN    WBC, Wet Prep HPF POC FEW (*) NONE SEEN     Assessment: Round Ligament Pain  Plan: Discharge home FU at [facility] tomorrow as scheduled  Stassi Fadely E. 03/11/2012,8:03 PM

## 2012-03-11 NOTE — Discharge Instructions (Signed)

## 2012-03-11 NOTE — MAU Note (Signed)
Patient states she has been having abdominal tightening since 1730, has a spot of bright red blood on tissue with wiping after urinating x 3. Reports fetal movement but not as much as usual.

## 2012-03-24 ENCOUNTER — Encounter (HOSPITAL_COMMUNITY): Payer: Self-pay

## 2012-03-24 ENCOUNTER — Inpatient Hospital Stay (HOSPITAL_COMMUNITY)
Admission: AD | Admit: 2012-03-24 | Discharge: 2012-03-24 | Disposition: A | Discharge status: Home / Self Care | Payer: BC Managed Care – PPO | Source: Ambulatory Visit | Attending: Obstetrics | Admitting: Obstetrics

## 2012-03-24 DIAGNOSIS — R109 Unspecified abdominal pain: Secondary | ICD-10-CM | POA: Insufficient documentation

## 2012-03-24 DIAGNOSIS — O4700 False labor before 37 completed weeks of gestation, unspecified trimester: Secondary | ICD-10-CM | POA: Insufficient documentation

## 2012-03-24 LAB — URINALYSIS, ROUTINE W REFLEX MICROSCOPIC
Glucose, UA: NEGATIVE mg/dL
Hgb urine dipstick: NEGATIVE
Ketones, ur: NEGATIVE mg/dL
Protein, ur: NEGATIVE mg/dL
Urobilinogen, UA: 0.2 mg/dL (ref 0.0–1.0)

## 2012-03-24 LAB — WET PREP, GENITAL
Clue Cells Wet Prep HPF POC: NONE SEEN
Trich, Wet Prep: NONE SEEN

## 2012-03-24 LAB — OB RESULTS CONSOLE GBS: GBS: POSITIVE

## 2012-03-24 MED ORDER — LACTATED RINGERS IV SOLN
Freq: Once | INTRAVENOUS | Status: AC
  Administered 2012-03-24: 12:00:00 via INTRAVENOUS

## 2012-03-24 MED ORDER — MAGNESIUM SULFATE 40 G IN LACTATED RINGERS - SIMPLE
2.0000 g/h | Freq: Once | INTRAVENOUS | Status: AC
  Administered 2012-03-24: 2 g/h via INTRAVENOUS
  Filled 2012-03-24: qty 500

## 2012-03-24 MED ORDER — MAGNESIUM SULFATE BOLUS VIA INFUSION
4.0000 g | Freq: Once | INTRAVENOUS | Status: DC
  Filled 2012-03-24: qty 500

## 2012-03-24 MED ORDER — BETAMETHASONE SOD PHOS & ACET 6 (3-3) MG/ML IJ SUSP
12.0000 mg | Freq: Once | INTRAMUSCULAR | Status: AC
  Administered 2012-03-24: 12 mg via INTRAMUSCULAR
  Filled 2012-03-24: qty 2

## 2012-03-24 NOTE — Discharge Instructions (Signed)
CherylPatient's Consent, Request, or Refusal to Transfer TRANSFER CONSENT I __________________________________________________ acknowledge that my medical condition has been evaluated and explained to me by the Emergency Department physician and/or my attending physician who has recommended that I be transferred to the services of Dr. ____________________ at ____________________. The potential benefits of such transfer outweigh the potential risks of not being transferred. These risks have been fully explained to me and I fully understand them. With this knowledge and understanding, I agree and consent to be transferred. __________________________________________ Signature of Patient or legally responsible individual __________________________________________ Date and Time __________________________________________ Witness Signature __________________________________________ Date and Time TRANSFER REQUEST I __________________________________________________ acknowledge that my medical condition has been evaluated and explained to me by the Emergency Department physician and/or my attending physician who has recommended and offered to me further medical examination and treatment. The potential benefits of such further medical examination and treatment as well the potential risks associated with transfer to another facility have been fully explained to me and I fully understand them. In spite of this understanding, I refuse to consent to the further medical examination and treatment which has been offered to me, and request transfer to ____________________. __________________________________________ Signature of Patient or legally responsible individual __________________________________________ Date and Time __________________________________________ Witness Signature  __________________________________________ Date and Time TRANSFER REFUSAL I __________________________________________________  acknowledge that my medical condition has been evaluated and explained to me by the Emergency Department physician and/or my attending physician who has recommended that I be transferred to the services of Dr. ____________________ at ____________________. The potential benefits of such transfer, the potential risks associated with such transfer, and the probable risks of not being transferred have been fully explained to me and I fully understand them. Even though Dr. __________________________________________________ believes it is in my best interest to be transferred, I refuse to be transferred and I request instead to continue receiving treatment at ____________________. __________________________________________ Signature of Patient or legally responsible individual  __________________________________________ Date and Time __________________________________________ Witness Signature  __________________________________________ Date and Time Document Released: 07/04/2001 Document Revised: 09/28/2011 Document Reviewed: 05/28/2008 [facility] Patient Information 2012 [place], [place].

## 2012-03-24 NOTE — MAU Provider Note (Signed)
History     CSN: [id number]  Arrival date and time: 03/24/12 1027   First Provider Initiated Contact with Patient 03/24/12 1056      Chief Complaint  Patient presents with  . Vaginal Discharge  . Abdominal Cramping   HPI Deanna Sparks 36 y.o. [redacted]w[redacted]d  Client comes to MAU today with the complaint of having lots of mucus discharge today.  Was seen in MAU on 03-11-12 and diagnosed with round ligament pain - cervix was closed, long and thick at that visit.  No bleeding, no leaking, no abdominal pain today - feels some movement low near vagina.  Last birth was 14 years ago and both previous pregnancies were full term.  Client has had a previous Cesarean with last birth.  OB History    Grav Para Term Preterm Abortions TAB SAB Ect Mult Living   3 2 2       2       Past Medical History  Diagnosis Date  . Genital herpes     last outbreak 12/2011    Past Surgical History  Procedure Date  . Cesarean section     Family History  Problem Relation Age of Onset  . Hypertension Mother   . Anesthesia problems Neg Hx     History  Substance Use Topics  . Smoking status: Never Smoker   . Smokeless tobacco: Not on file  . Alcohol Use: No    Allergies: No Known Allergies  Prescriptions prior to admission  Medication Sig Dispense Refill  . Cholecalciferol (VITAMIN D) 400 UNITS capsule Take 400 Units by mouth daily.      . metoprolol tartrate (LOPRESSOR) 25 MG tablet Take 25 mg by mouth 2 (two) times daily. Pt unsure of dosage       . Prenatal Vit-Fe Fumarate-FA (PRENATAL MULTIVITAMIN) TABS Take 1 tablet by mouth every morning.         Review of Systems  Gastrointestinal: Negative for nausea, vomiting and abdominal pain.  Genitourinary:       Having thick mucus discharge   Physical Exam   Blood pressure 142/79, pulse 120, temperature 98.2 F (36.8 C), temperature source Oral, resp. rate 18, height 5' 8.5" (1.74 m), weight 255 lb 3.2 oz (115.758 kg), last menstrual period  09/06/2011.  Physical Exam  Nursing note and vitals reviewed. Constitutional: She is oriented to person, place, and time. She appears well-developed and well-nourished.  HENT:  Head: Normocephalic.  Eyes: EOM are normal.  Neck: Neck supple.  GI: Soft. There is no tenderness.       No contractions seen on monitor strip.  Client not reporting contractions.  Genitourinary:       Speculum exam: Vulva - negative Vagina - Small amount of creamy discharge, no odor Cervix - suspect cervix is open on visual exam.  Could initially see a small opening with membranes. No contact bleeding Bimanual exam: Cervix 2-3, 100%, moving lumpy fetal parts felt GC/Chlam, wet prep, GBS done Chaperone present for exam.  Musculoskeletal: Normal range of motion.  Neurological: She is alert and oriented to person, place, and time.  Skin: Skin is warm and dry.  Psychiatric: She has a normal mood and affect.    MAU Course  Procedures  MDM Consult with Dr. Clearance Coots x 2.  Wanted to admit, but NICU MD advises transfer based on NICU census.  Client to be transferred.  Assessment and Plan  Preterm cervical dilatation  Plan Transfer to [facility] MgSO4 started -  4 gm bolus and 2 gm per hour IV of 1000cc LR at 125cc/hr Betamethasone ordered.   Deanna Sparks 03/24/2012, 11:23 AM

## 2012-03-24 NOTE — MAU Note (Signed)
Patient presents with c/o having a lot of mucus discharge onset this morning with mild tightening in abdomen, no history of PTL  Patient is [redacted]w[redacted]d

## 2012-03-25 LAB — GC/CHLAMYDIA PROBE AMP, GENITAL: GC Probe Amp, Genital: NEGATIVE

## 2012-03-27 ENCOUNTER — Inpatient Hospital Stay (HOSPITAL_COMMUNITY)
Admission: AD | Admit: 2012-03-27 | Discharge: 2012-04-13 | DRG: 379 | Disposition: A | Discharge status: Home / Self Care | Payer: BC Managed Care – PPO | Source: Ambulatory Visit | Attending: Obstetrics & Gynecology | Admitting: Obstetrics & Gynecology

## 2012-03-27 ENCOUNTER — Encounter (HOSPITAL_COMMUNITY): Payer: Self-pay | Admitting: *Deleted

## 2012-03-27 DIAGNOSIS — O09529 Supervision of elderly multigravida, unspecified trimester: Secondary | ICD-10-CM | POA: Diagnosis present

## 2012-03-27 DIAGNOSIS — O328XX0 Maternal care for other malpresentation of fetus, not applicable or unspecified: Secondary | ICD-10-CM | POA: Diagnosis present

## 2012-03-27 DIAGNOSIS — O4700 False labor before 37 completed weeks of gestation, unspecified trimester: Principal | ICD-10-CM | POA: Diagnosis present

## 2012-03-27 MED ORDER — VALACYCLOVIR HCL 500 MG PO TABS
500.0000 mg | ORAL_TABLET | Freq: Every day | ORAL | Status: DC
  Administered 2012-03-28 – 2012-04-13 (×17): 500 mg via ORAL
  Filled 2012-03-27 (×19): qty 1

## 2012-03-27 MED ORDER — CHOLECALCIFEROL 10 MCG (400 UNIT) PO TABS
400.0000 [IU] | ORAL_TABLET | Freq: Every day | ORAL | Status: DC
  Administered 2012-03-27 – 2012-04-13 (×18): 400 [IU] via ORAL
  Filled 2012-03-27 (×19): qty 1

## 2012-03-27 MED ORDER — ASPIRIN 81 MG PO CHEW
81.0000 mg | CHEWABLE_TABLET | Freq: Every day | ORAL | Status: DC
  Administered 2012-03-27 – 2012-04-13 (×18): 81 mg via ORAL
  Filled 2012-03-27 (×19): qty 1

## 2012-03-27 MED ORDER — METOPROLOL TARTRATE 25 MG PO TABS
25.0000 mg | ORAL_TABLET | Freq: Two times a day (BID) | ORAL | Status: DC
  Administered 2012-03-27 – 2012-04-13 (×34): 25 mg via ORAL
  Filled 2012-03-27 (×37): qty 1

## 2012-03-27 MED ORDER — DOCUSATE SODIUM 100 MG PO CAPS
100.0000 mg | ORAL_CAPSULE | Freq: Every day | ORAL | Status: DC
  Administered 2012-03-27 – 2012-04-13 (×18): 100 mg via ORAL
  Filled 2012-03-27 (×18): qty 1

## 2012-03-27 MED ORDER — PRENATAL MULTIVITAMIN CH
1.0000 | ORAL_TABLET | Freq: Every day | ORAL | Status: DC
  Administered 2012-03-28 – 2012-04-13 (×17): 1 via ORAL
  Filled 2012-03-27 (×17): qty 1

## 2012-03-27 MED ORDER — CALCIUM CARBONATE ANTACID 500 MG PO CHEW
2.0000 | CHEWABLE_TABLET | ORAL | Status: DC | PRN
  Administered 2012-03-31: 200 mg via ORAL
  Administered 2012-04-10: 400 mg via ORAL
  Filled 2012-03-27: qty 2
  Filled 2012-03-27: qty 1

## 2012-03-27 MED ORDER — FAMOTIDINE 20 MG PO TABS
20.0000 mg | ORAL_TABLET | Freq: Every evening | ORAL | Status: DC | PRN
  Administered 2012-03-27: 20 mg via ORAL
  Filled 2012-03-27: qty 1

## 2012-03-27 MED ORDER — ONDANSETRON 4 MG PO TBDP
4.0000 mg | ORAL_TABLET | Freq: Four times a day (QID) | ORAL | Status: DC | PRN
  Administered 2012-03-27 – 2012-04-13 (×18): 4 mg via ORAL
  Filled 2012-03-27 (×12): qty 1

## 2012-03-27 MED ORDER — ACETAMINOPHEN 325 MG PO TABS
650.0000 mg | ORAL_TABLET | ORAL | Status: DC | PRN
  Administered 2012-04-01 – 2012-04-12 (×8): 650 mg via ORAL
  Filled 2012-03-27 (×9): qty 2

## 2012-03-27 MED ORDER — ZOLPIDEM TARTRATE 10 MG PO TABS: 10.0000 mg | ORAL_TABLET | Freq: Every evening | ORAL | Status: DC | PRN

## 2012-03-27 MED ORDER — PANTOPRAZOLE SODIUM 40 MG PO TBEC
40.0000 mg | DELAYED_RELEASE_TABLET | Freq: Every day | ORAL | Status: DC | PRN
  Administered 2012-03-27 – 2012-04-10 (×3): 40 mg via ORAL
  Filled 2012-03-27 (×3): qty 1

## 2012-03-27 NOTE — H&P (Signed)
Deanna Sparks is a 36 y.o. female presenting for continued hospitalized bedrest. Maternal Medical History:  Reason for admission: Reason for Admission:   [facility]  Initially presented in PTL/footling breech presentation.  She received MgSO4 for tocolysis and a course of steroids.  An U/S showed an AGA fetus, normal AFI.  Prenatal complications: Hypertension.     OB History    Grav Para Term Preterm Abortions TAB SAB Ect Mult Living   3 2 2  0 0 0 0 0 0 2     Past Medical History  Diagnosis Date  . Genital herpes     last outbreak 12/2011   Past Surgical History  Procedure Date  . Cesarean section    Family History: family history includes Hypertension in her mother.  There is no history of Anesthesia problems. Social History:  reports that she has never smoked. She does not have any smokeless tobacco history on file. She reports that she does not drink alcohol or use illicit drugs.  Review of Systems  Constitutional: Negative for fever.  Eyes: Negative for blurred vision.  Respiratory: Negative for shortness of breath.   Gastrointestinal: Negative for nausea and vomiting.  Skin: Negative for rash.  Neurological: Negative for headaches.      Last menstrual period 09/06/2011. Maternal Exam:  Introitus: not evaluated.   Cervix: not evaluated.   Physical Exam  Constitutional: She appears well-developed.    Prenatal labs: ABO, Rh: --/--/A POS (01/03 1905)    Assessment/Plan: 36 y.o. w/an IUP @ [redacted]w[redacted]d.  Threatened PTL/footling breech presentation.  S/P MgSO4 tocolysis/steroids.  H/O previous C/D.  Admit Continue hospitalization, bedrest Offer TDAP ?GDM testing   JACKSON-MOORE,Allison [employer]hotels A 03/27/2012, 5:58 PM

## 2012-03-28 ENCOUNTER — Encounter (HOSPITAL_COMMUNITY): Payer: Self-pay

## 2012-03-28 LAB — CULTURE, BETA STREP (GROUP B ONLY)

## 2012-03-28 NOTE — Progress Notes (Signed)
Patient has denied pain and contractions throughout the shift.

## 2012-03-28 NOTE — Progress Notes (Signed)
Pt sleeping, denies pain

## 2012-03-28 NOTE — Progress Notes (Signed)
Patient ID: Deanna Sparks, female   DOB: 02-Nov-1975, 36 y.o.   MRN: [id number] Hospital Day: 2  S: Preterm labor symptoms: pelvic pressure  O: Blood pressure 117/76, pulse 87, temperature 98.1 F (36.7 C), temperature source Axillary, resp. rate 18, last menstrual period 09/06/2011, SpO2 100.00%.   XBM:WUXLKGMW: 150 bpm Toco: occasional mild UC's SVE:   A/P- 35 y.o. admitted with:  Pelvic and preterm cervical dilation and effacement at 27 weeks.  Presentation is footling breech.  Patient transferred to [facility] because of NICU status here.  Patient stabilized on Magnesium Sulfate and is now back at [facility] for further management.  Currently stable.  Will continue bedrest.    Pregnancy Complications: preterm labor   Preterm labor management: bedrest advised Dating:  [redacted]w[redacted]d PNL Needed:  none FWB:  good PTL:  stable

## 2012-03-28 NOTE — Progress Notes (Signed)
Patient ID: Deanna Sparks, female   DOB: 1976/08/06, 36 y.o.   MRN: [id number]

## 2012-03-29 MED ORDER — TETANUS-DIPHTH-ACELL PERTUSSIS 5-2.5-18.5 LF-MCG/0.5 IM SUSP
0.5000 mL | Freq: Once | INTRAMUSCULAR | Status: AC
  Administered 2012-03-29: 0.5 mL via INTRAMUSCULAR

## 2012-03-29 NOTE — Progress Notes (Signed)
UR Chart review completed.  

## 2012-03-29 NOTE — Progress Notes (Signed)
Patient ID: Deanna Sparks, female   DOB: 04-09-1976, 36 y.o.   MRN: [id number] Hospital Day: 3  S: Preterm labor symptoms: pelvic pressure  O: Blood pressure 126/83, pulse 98, temperature 98.8 F (37.1 C), temperature source Oral, resp. rate 19, height 5\' 8"  (1.727 m), weight 115.667 kg (255 lb), last menstrual period 09/06/2011, SpO2 97.00%.   ION:GEXBMWUX: 150 bpm Toco: None SVE:   omitted  A/P- 36 y.o. admitted with:  Preterm cervical dilatation.  Footling breech.  Stable after Magnesium Sulfate tocolysis at [facility].  Continue bedrest.    Pregnancy Complications: preterm labor  Preterm labor management: bedrest advised Dating:  [redacted]w[redacted]d PNL Needed:  none FWB:  good PTL:  stable

## 2012-03-30 ENCOUNTER — Encounter (HOSPITAL_COMMUNITY): Payer: Self-pay | Admitting: *Deleted

## 2012-03-30 NOTE — Progress Notes (Signed)
Patient ID: Deanna Sparks, female   DOB: Feb 03, 1976, 36 y.o.   MRN: [id number] Vital signs normal no contractions no leakage no complaints

## 2012-03-31 ENCOUNTER — Encounter: Payer: Self-pay | Admitting: Advanced Practice Midwife

## 2012-03-31 DIAGNOSIS — O99820 Streptococcus B carrier state complicating pregnancy: Secondary | ICD-10-CM | POA: Insufficient documentation

## 2012-03-31 NOTE — Progress Notes (Signed)
Patient ID: Deanna Sparks, female   DOB: 09/05/1976, 36 y.o.   MRN: [id number] Vital signs normal No change since yesterday continue present therapy

## 2012-04-01 NOTE — Progress Notes (Signed)
Ur chart review completed.  

## 2012-04-01 NOTE — Progress Notes (Signed)
Patient ID: Deanna Sparks, female   DOB: [birth year], 36 y.o.   MRN: [id number] Hospital Day: 6  S: Preterm labor symptoms: none  O: Blood pressure 110/57, pulse 93, temperature 98.5 F (36.9 C), temperature source Oral, resp. rate 18, height 5\' 8"  (1.727 m), weight 115.667 kg (255 lb), last menstrual period 09/06/2011, SpO2 97.00%.   WUJ:WJXBJYNW: 160 bpm and Variability: Fair (1-6 bpm) Toco: None SVE: deferred  A/P- 36 y.o. admitted with:  Patient Active Hospital Problem List: Threatened preterm labor, antepartum (03/27/2012)   Assessment: stable   Plan: continued bedrest Footling breech presentation (03/27/2012)     FWB:  Fetal testing reassuring

## 2012-04-02 ENCOUNTER — Inpatient Hospital Stay (HOSPITAL_COMMUNITY): Payer: BC Managed Care – PPO

## 2012-04-02 MED ORDER — SODIUM CHLORIDE 0.9 % IJ SOLN
3.0000 mL | Freq: Two times a day (BID) | INTRAMUSCULAR | Status: DC
  Administered 2012-04-02 – 2012-04-03 (×3): 3 mL via INTRAVENOUS

## 2012-04-02 NOTE — Progress Notes (Signed)
04/02/12 1400  Clinical Encounter Type  Visited With Patient  Visit Type Initial;Spiritual support;Social support  Referral From Chaplain  Spiritual Encounters  Spiritual Needs Emotional  Stress Factors  Patient Stress Factors (Pregnancy complications, feeling isolated in hospital )    Per referral from [facility], made initial visit to offer chaplain services.  Deanna Sparks was receptive to a visit despite her feeling so sick, explaining that she feels a bit lonely and isolated away from home and the bustle of more visitors/social contact.  Provided pastoral presence, empathic listening, and information about chaplain availability and spiritual/emotional support.  Pt then received phone call from her minister, a source of welcome support.  Spiritual Care will continue to follow.  [address] [place], [place] [phone]

## 2012-04-02 NOTE — Progress Notes (Signed)
Pt nauseated requesting meds

## 2012-04-02 NOTE — Progress Notes (Signed)
Patient seen today  for follow up ultrasound.  See full report in [facility].  Alpha Gula, MD  IUP at 28+3 weeks Interval growth is appropriate (52nd percentile) Normal amniotic fluid volume TVUS  reveals U-shaped funneling with the cervix open/ dilated to approx 2-3 cm Fetus now cephalic presentation  Continue inpatient observation/ management.  Follow up ultrasounds as clinically indicated.

## 2012-04-02 NOTE — Progress Notes (Signed)
Patient ID: Deanna Sparks, female   DOB: 1976-06-01, 36 y.o.   MRN: [id number] Hospital Day: 7  S: Preterm labor symptoms: pelvic pressure  O: Blood pressure 113/52, pulse 110, temperature 98.1 F (36.7 C), temperature source Oral, resp. rate 24, height 5\' 8"  (1.727 m), weight 115.667 kg (255 lb), last menstrual period 09/06/2011, SpO2 97.00%.   HKV:QQVZDGLO: 150 bpm Toco: occasional mild UC's. SVE:  omitted  A/P- 36 y.o. admitted with: Cervical dilation and effacement with bulging membranes and footling breech.   Pregnancy Complications: preterm labor   Preterm labor management: bedrest advised Dating:  [redacted]w[redacted]d PNL Needed:  none FWB:  good PTL:  stable

## 2012-04-03 ENCOUNTER — Inpatient Hospital Stay (HOSPITAL_COMMUNITY): Payer: BC Managed Care – PPO

## 2012-04-03 ENCOUNTER — Encounter (HOSPITAL_COMMUNITY): Payer: Self-pay | Admitting: Obstetrics

## 2012-04-03 NOTE — Progress Notes (Signed)
28 4/[redacted] weeks gestation, with PTL.  Height  68" Weight 255 Lbs, pre-pregnancy weight 231 Lbs.Pre-pregnancy  BMI 35.2 (class II obesity)  IBW 140 Lbs  Total weight gain 24 Lbs. Weight gain goals 11-20 Lbs.   Estimated needs: 22-2400 kcal/day, 75-90 grams protein/day, 2.5 liters fluid/day Regular diet tolerated well, appetite good. Pt is hungry and requests double portions. Oriented to snack menu Current diet prescription will provide for increased needs. No abnormal nutrition related labs  Nutrition Dx: Increased nutrient needs r/t pregnancy and fetal growth requirements aeb [redacted] weeks gestation.  No educational needs assessed at this time.

## 2012-04-03 NOTE — Progress Notes (Signed)
MFM note  Deanna Sparks is a 36 yo G3P2002 currently at 44 4/7 weeks - currently admitted due to advanced cervical dilation. The patient was admitted and transferred to [facility] on 2 June due to preterm labor.  She completed a course of betamethasone for fetal lung maturity and Magnesium sulfate for tocolysis / neuroprotection.  She was transferred back to [facility] on 5 June and has been stable on bedrest since.  Initially, the fetus was noted to be footling breech presentation and she remained in house due to concerns of cord prolapse.  Ultrasound yesterday showed the fetus now cephalic presentation.  She is currently stable on bedrest.  She denies contractions, vaginal bleeding or leakage of fluid.  The fetus is active.  Her prenatal course has been complicated by HSV (last outbreak about 3 months ago), GBS pos status, and gestational hypertension without hx of chronic hypertension prior to pregnancy.  POB hx: Term SVD without comlications C/section at 38 weeks due to active HSV lesions.  PMH- HSV, gestational hypertension  Meds - Valtrex, Lopressor 25 mg BID, baby ASA, Calcium/Vitamin D supplements, PNV, Pepcid/ Protonix prn  Social - non smoker, non drinker, denies illicit drug use  Fam hx neg for birth defects/ hereditary disorders  Ultrasound (6/11) - Interval growth appropriate (52nd percentile), TVUS - U-shaped funneling with cervix open 2-3 cm, cephalic presentation.  Assessment: 1) IUP at 28 4/7 weeks 2) Advanced cervical dilation- s/p course of betamethasone 3)  Hx of HSV 4) GBS pos status  Recommendations: Based on ultrasound findings and exam, the patient remains at very high risk for preterm delivery.  Although the fetus is now cephalic by most recent ultrasound and concerns for cord prolapse are not as high, would recommend continued inpatient observation / bedrest until approximately 30 weeks.  If stable at that time, would entertain discharge home on  bedrest.  Would also check fetal presentation by ultrasound prior to discharge at that time.  Concur with Valtrex due to hx of HSV.  Thank you for this referral.  Please do not hesitate to call our office if we can be of further assistance.  Alpha Gula, MD  I spent approximately 15 minutes with this patient with over 50% of the time spent in face-to-face counseling.

## 2012-04-03 NOTE — Progress Notes (Signed)
Patient ID: Deanna Sparks, female   DOB: 01-06-1976, 36 y.o.   MRN: [id number] Hospital Day: 8  S: Preterm labor symptoms: pelvic pressure  O: Blood pressure 106/47, pulse 104, temperature 97.9 F (36.6 C), temperature source Oral, resp. rate 18, height 5\' 8"  (1.727 m), weight 115.667 kg (255 lb), last menstrual period 09/06/2011, SpO2 97.00%.   HYQ:MVHQIONG: 140 bpm Toco: Occasional UC's, mild. SVE:   A/P- 36 y.o. admitted with:  Preterm cervical changes, footling breech.  Stable.  Continue bedrest.    Pregnancy Complications: preterm labor   Preterm labor management: bedrest advised Dating:  [redacted]w[redacted]d PNL Needed:  none FWB:  good PTL:  stable

## 2012-04-04 NOTE — Progress Notes (Signed)
Patient ID: Deanna Sparks, female   DOB: December 05, 1975, 36 y.o.   MRN: [id number] Hospital Day: 62  S: Preterm labor symptoms: low back pain and pelvic pressure  O: Blood pressure 107/58, pulse 98, temperature 97.8 F (36.6 C), temperature source Oral, resp. rate 18, height 5\' 8"  (1.727 m), weight 113.989 kg (251 lb 4.8 oz), last menstrual period 09/06/2011, SpO2 97.00%.   WUJ:WJXBJYNW: 150 bpm Toco: None SVE:  omitted A/P- 36 y.o. admitted with:  Preterm cervical changes.  Footling breech.  Now vertex presentation and stable.  Continue bedrest IP until 30 weeks, per MFM recommendation.    Pregnancy Complications: preterm labor   Preterm labor management: bedrest advised Dating:  [redacted]w[redacted]d PNL Needed:  none FWB:  good PTL:  stable

## 2012-04-04 NOTE — Progress Notes (Signed)
UR chart review completed.  

## 2012-04-05 NOTE — Progress Notes (Signed)
Patient ID: Deanna Inclan RowlandHospital Day: 10  S: Preterm labor symptoms: Cervical changes with no significant uterine activity.  O: Blood pressure 126/66, pulse 99, temperature 98.2 F (36.8 C), temperature source Oral, resp. rate 24, height 5\' 8"  (1.727 m), weight 113.989 kg (251 lb 4.8 oz), last menstrual period 09/06/2011, SpO2 97.00%.   JXB:JYNWGNFA: 150 bpm Toco: None SVE:  omitted A/P- 36 y.o. admitted with:  Preterm cervical changes.  Stable.  Continue bedrest.    Pregnancy Complications: preterm labor   Preterm labor management: bedrest advised Dating:  [redacted]w[redacted]d PNL Needed:  none FWB:  good PTL:  stable  female   DOB: [birth year], 36 y.o.   MRN: [id number]

## 2012-04-06 NOTE — Progress Notes (Signed)
Patient ID: Deanna Sparks, female   DOB: Apr 22, 1976, 36 y.o.   MRN: [id number] Hospital Day: 45  S: Preterm labor symptoms: none  O: Blood pressure 102/61, pulse 104, temperature 98.4 F (36.9 C), temperature source Oral, resp. rate 20, height 5\' 8"  (1.727 m), weight 113.989 kg (251 lb 4.8 oz), last menstrual period 09/06/2011, SpO2 97.00%.   HKV:QQVZDGLO: 150 bpm Toco: None SVE: deferred  A/P- 36 y.o. admitted with:  Patient Active Hospital Problem List: Threatened preterm labor, antepartum (03/27/2012)   Assessment: stable   Plan: continue expectant management Footling breech presentation (03/27/2012)   Assessment: unstable lie/preterm   Plan: see above   Pregnancy Complications: preterm labor   Preterm labor management: no treatment necessary Dating:  [redacted]w[redacted]d

## 2012-04-07 NOTE — Progress Notes (Signed)
Patient ID: Deanna Sparks, female   DOB: 1976/05/11, 36 y.o.   MRN: [id number] Hospital Day: 12  S: Preterm labor symptoms: none  O: Blood pressure 127/75, pulse 103, temperature 98.2 F (36.8 C), temperature source Oral, resp. rate 20, height 5\' 8"  (1.727 m), weight 113.989 kg (251 lb 4.8 oz), last menstrual period 09/06/2011, SpO2 97.00%.   NWG:NFAOZHYQ: 150 bpm Toco: None SVE: deferred  A/P- 36 y.o. admitted with:  Patient Active Hospital Problem List: Threatened preterm labor, antepartum (03/27/2012)   Assessment: stable   Plan: continue expectant management Footling breech presentation (03/27/2012)   Assessment: unstable lie/preterm   Plan: see above   Pregnancy Complications: preterm labor   Preterm labor management: no treatment necessary Dating:  [redacted]w[redacted]d

## 2012-04-08 LAB — TYPE AND SCREEN: Antibody Screen: NEGATIVE

## 2012-04-08 NOTE — Progress Notes (Signed)
Ur chart review completed.  

## 2012-04-08 NOTE — Progress Notes (Signed)
Patient ID: Deanna Sparks, female   DOB: Apr 26, 1976, 36 y.o.   MRN: [id number] Hospital Day: 6  S: Preterm labor symptoms: none  O: Blood pressure 121/72, pulse 99, temperature 98.1 F (36.7 C), temperature source Oral, resp. rate 18, height 5\' 8"  (1.727 m), weight 113.989 kg (251 lb 4.8 oz), last menstrual period 09/06/2011, SpO2 97.00%.   NWG:NFAOZHYQ: 150 bpm Toco: None SVE: deferred  A/P- 36 y.o. admitted with:  Patient Active Hospital Problem List: Threatened preterm labor, antepartum (03/27/2012)   Assessment: stable   Plan: continue expectant management Footling breech presentation (03/27/2012)   Assessment: unstable lie/preterm   Plan: see above   Pregnancy Complications: preterm labor   Preterm labor management: no treatment necessary Dating:  [redacted]w[redacted]d

## 2012-04-09 MED ORDER — OXYCODONE-ACETAMINOPHEN 5-325 MG PO TABS
2.0000 | ORAL_TABLET | ORAL | Status: DC | PRN
  Filled 2012-04-09: qty 2

## 2012-04-09 MED ORDER — OXYCODONE-ACETAMINOPHEN 5-325 MG PO TABS
1.0000 | ORAL_TABLET | ORAL | Status: DC | PRN
  Administered 2012-04-09: 1 via ORAL

## 2012-04-09 MED ORDER — GI COCKTAIL ~~LOC~~
30.0000 mL | Freq: Once | ORAL | Status: AC
  Administered 2012-04-09: 30 mL via ORAL
  Filled 2012-04-09: qty 30

## 2012-04-09 NOTE — Progress Notes (Signed)
Patient ID: Deanna Sparks, female   DOB: Jun 26, 1976, 36 y.o.   MRN: [id number] Hospital Day: 59  S: Preterm labor symptoms: low back pain and pelvic pressure  O: Blood pressure 119/68, pulse 99, temperature 98.5 F (36.9 C), temperature source Oral, resp. rate 18, height 5\' 8"  (1.727 m), weight 113.989 kg (251 lb 4.8 oz), last menstrual period 09/06/2011, SpO2 97.00%.   WUJ:WJXBJYNW: 140 bpm Toco: occasional UC's. SVE:   A/P- 36 y.o. admitted with: Advanced cervical changes,  Stable.  Continue bedrest.    Pregnancy Complications: Preterm cervical changes.  Preterm labor management: bedrest advised Dating:  [redacted]w[redacted]d PNL Needed:  none FWB:  good PTL:  stable

## 2012-04-11 NOTE — Progress Notes (Signed)
UR Chart review completed.  

## 2012-04-11 NOTE — Progress Notes (Signed)
Patient ID: Deanna Sparks, female   DOB: [birth year], 36 y.o.   MRN: [id number] Hospital Day: 53  S: Preterm labor symptoms: Advanced cervical change.  O: Blood pressure 108/57, pulse 109, temperature 98.4 F (36.9 C), temperature source Oral, resp. rate 18, height 5\' 8"  (1.727 m), weight 113.989 kg (251 lb 4.8 oz), last menstrual period 09/06/2011, SpO2 97.00%.   WUJ:WJXBJYNW: 150 bpm Toco: None SVE:   omitted  A/P- 36 y.o. admitted with:  Cervical dilation, footling breech.    Pregnancy Complications: None and omitted  Preterm labor management: bedrest advised Dating:  [redacted]w[redacted]d PNL Needed:  none FWB:  good PTL:  stable

## 2012-04-12 NOTE — Progress Notes (Signed)
Patient ID: Deanna Mendell RowlandHospital Day: 35  S: Preterm labor symptoms: Cervical dilation.  Footling breech.  O: Blood pressure 118/62, pulse 104, temperature 98.4 F (36.9 C), temperature source Oral, resp. rate 20, height 5\' 8"  (1.727 m), weight 113.989 kg (251 lb 4.8 oz), last menstrual period 09/06/2011, SpO2 97.00%.   UXL:KGMWNUUV: 150 bpm Toco: None SVE:  omitted  A/P- 36 y.o. admitted with:  Advanced cervical dilation and footling breech presentation.    Pregnancy Complications: preterm labor   Preterm labor management: bedrest advised Dating:  [redacted]w[redacted]d PNL Needed:  none FWB:  good PTL:  stable female   DOB: June 13, 1976, 36 y.o.   MRN: [id number]

## 2012-04-13 MED ORDER — DSS 100 MG PO CAPS: 100.0000 mg | ORAL_CAPSULE | Freq: Every day | ORAL | Status: DC

## 2012-04-13 NOTE — Progress Notes (Signed)
Dr Clearance Coots reviewed this mornings monitor strip

## 2012-04-13 NOTE — Discharge Summary (Signed)
  Physician Discharge Summary  Patient ID: Deanna Sparks MRN: [id number] DOB/AGE: 01/05/1976 36 y.o.  Admit date: 03/27/2012 Discharge date: 04/13/2012  Admission Diagnoses: Advanced cervical dilation,  3rd trimester.  Footling breech.  Discharge Diagnoses: Same.  30 weeks.  Vertex presentation. Active Problems:  Threatened preterm labor, antepartum  Footling breech presentation  Preterm labor   Discharged Condition: good  Hospital Course: Presented to ultrasound with 3 cm cervical dilation and footling breech at 27 weeks.  Transferred to [facility] because high NICU census.  Patient stabilized and transferred back to [facility] for further management.  Patient remained stable off tocolysis.  Discharged home at 30 weeks with fetus in vertex presentation.  Consults: MFM  Significant Diagnostic Studies: Ultrasound  Treatments: IV hydration, steroids: Betamethasone and Magnesium Sulfate.  Discharge Exam: Blood pressure 109/61, pulse 97, temperature 98.2 F (36.8 C), temperature source Oral, resp. rate 20, height 5\' 8"  (1.727 m), weight 113.989 kg (251 lb 4.8 oz), last menstrual period 09/06/2011, SpO2 97.00%. General appearance: alert and no distress GI: soft, non-tender; bowel sounds normal; no masses,  no organomegaly Extremities: extremities normal, atraumatic, no cyanosis or edema  Disposition: 01-Home or Self Care  Discharge Orders    Future Orders Please Complete By Expires   OB RESULT CONSOLE Group B Strep      Comments:   This external order was created through the Results Console.   OB RESULTS CONSOLE RPR      Comments:   This external order was created through the Results Console.   OB RESULTS CONSOLE HIV antibody      Comments:   This external order was created through the Results Console.   OB RESULTS CONSOLE Rubella Antibody      Comments:   This external order was created through the Results Console.   OB RESULTS CONSOLE Hepatitis B surface antigen      Comments:   This external order was created through the Results Console.   OB RESULTS CONSOLE Antibody Screen      Comments:   This external order was created through the Results Console.     Medication List  As of 04/13/2012  1:11 PM   TAKE these medications         DSS 100 MG Caps   Take 100 mg by mouth daily.      metoprolol tartrate 25 MG tablet   Commonly known as: LOPRESSOR   Take 25 mg by mouth 2 (two) times daily. Pt unsure of dosage        prenatal multivitamin Tabs   Take 1 tablet by mouth every morning.      Vitamin D 400 UNITS capsule   Take 400 Units by mouth daily.           Follow-up Information    Follow up with Tamikia Chowning A, MD. Schedule an appointment as soon as possible for a visit in 2 weeks.   Contact information:   [address] [place] [address] [phone]          Signed: Brock Bad 04/13/2012, 1:11 PM

## 2012-04-13 NOTE — Progress Notes (Signed)
Patient ID: Deanna Sparks, female   DOB: 1976-08-10, 36 y.o.   MRN: [id number] Hospital Day: 64  S: Preterm labor symptoms: Advanced cervical change and footling breech presentation.  O: Blood pressure 113/66, pulse 100, temperature 98 F (36.7 C), temperature source Oral, resp. rate 20, height 5\' 8"  (1.727 m), weight 113.989 kg (251 lb 4.8 oz), last menstrual period 09/06/2011, SpO2 97.00%.   WUJ:WJXBJYNW: 140 bpm Toco: None SVE:   omitted  A/P- 36 y.o. admitted with:  Cervical change on [place].    Pregnancy Complications: preterm labor   Preterm labor management: bedrest advised Dating:  [redacted]w[redacted]d PNL Needed:  none FWB:  good PTL:  stable

## 2012-04-13 NOTE — Discharge Instructions (Signed)
Prematurity, Complications The following are some of the problems the premature infant may encounter. Not all premature infants will develop these problems and some of them will have none of them. ANEMIA What is anemia? Anemia is a shortage of red blood cell. Red blood cells carry oxygen to the body tissues. Anemia results from the short life of red blood cells in an infant, and a preemie's delayed process of making new ones. Is anemia dangerous? If left untreated, anemia can lead to poor weight gain and growth, decreased activity, increased heart rate, and developmental delays. Babies suffering from anemia at 68 months of age have been found to perform poorly on developmental tests, particularly those assessing motor skills. How is anemia treated? Anemia does not always need to be treated if it is not severe and if the baby is not sick or having frequent laboratory tests. If treatment is necessary, it's usually done through transfusions of red blood cells obtained from a blood bank. Anemia can also be treated with a drug similar to the substance the body normally produces to increase the number of red blood cells. As the baby grows, he may need an additional source of iron to speed up the production of red blood cells. APNEA What is apnea? Apnea is a common health problem in premature babies. Spells of apnea may involve a pause in breathing, a decrease in heart rate, or a change in skin color. Apnea is caused by immaturity in the area of the brain that controls breathing. Almost all babies born at 30 weeks or less will experience apnea. These spells become less frequent with age. They usually disappear by time the baby nears his due date. Is apnea dangerous? Although highly unlikely, apnea could lead to a low blood oxygen content, which in turn could cause brain damage. Apnea can also cause strain on a baby's heart and lungs. Despite past fears, a newborn's apnea does not increase their risk of SIDS. How is  apnea treated? Treating apnea can be as simple as gently stimulating the infant to restart breathing. However, when apnea occurs frequently, the infant may require medication, a ventilator, or a nasal device that blows a steady stream of air into her airways. BLOOD SUGAR ABNORMALITIES What causes blood sugar abnormalities? A newborn preemie's blood sugar may be either too low or too high. Low blood sugar is common soon after birth. High blood sugar is more common in babies who are getting most or all of their nutrition intravenously. Are blood sugar abnormalities dangerous? If left untreated for a number of years, blood sugar fluctuations can lead to serious problems in baby's eyes, kidneys, nerves, gums and teeth, and blood vessels. However, once a baby is feeding regularly, blood sugar problems seldom recur. Blood sugar fluctuations in preemies do not indicate the development of diabetes later in life. How are blood sugar abnormalities treated? Low blood sugar is treated by increasing baby's sugar intake, either by sugar water through an IV or by more frequent feedings. High blood sugar is treated by decreasing the sugar in IV fluids or by providing the baby with insulin. FEEDING PROBLEMS What causes feeding problems? Premature babies are often unable to coordinate their sucking and breathing. This can lead to breathing difficulties when they try to feed through breast or bottle. A healthy sucking and breathing coordination usually forms at around 34 weeks after gestation. This allows a baby to begin breastfeeding or bottle feeding normally. Are feeding problems dangerous? If a baby is unable to  feed through sucking, the problem is recognized quickly and the baby is fed nutrients in other ways. It is healthy and normal for a baby to lose weight in the first few weeks after birth, since newborns release water that was retained in the womb. Weight loss is not a sign of malnutrition.  How are feeding problems  treated? Babies who are unable to eat through sucking are fed nutrients through one of the following methods:  Total Parenteral Nutrition (TPN), providing protein, fat, sugar, vitamins, and minerals intravenously (through a vein)   Milk through a tube entering baby's nose or mouth   Drip feedings passing directly into baby's intestines  INFECTIONS What causes infections? There are two kinds of infections that preemies are susceptible to:  Generalized infection (infection of the blood)   Localized infections under the skin  Infections are a bigger threat to premature infants because they are less able than full-term infants to fight germs. Infections in babies can be caused by bacteria, viruses, or fungi, and can start before birth, near the time of birth, or while the baby is in the nursery. Are infections dangerous? Most of the time the baby's infection responds rapidly to antibiotics and there are no long-term problems. Long-term problems are more likely if the baby has meningitis, or if he or she experiences severe low blood pressure for a long period of time. How are infections treated? Bacterial infections can be treated with antibiotics. Other medications are prescribed to treat viral and fungal infections. Frequent hand washing by anyone who comes into contact with your baby will also reduce the risk of infection. LOW BLOOD PRESSURE What causes low blood pressure? Low blood pressure, or hypotension, is a relatively common complication that may occur shortly after birth. It refers to poor circulatory function which results in inadequate blood flow to the heart, brain, and other vital organs. In a newborn, low blood pressure can be due to infection, blood loss, fluid loss, or medications given to the mother before delivery. Is low blood pressure dangerous? It's not dangerous if it's treated early. Low blood pressure can lessen the body's ability to circulate its nutrients and clean its wastes.  It can also result in heart problems if left untreated. How is low blood pressure treated? Low blood pressure is usually treated with medication or by increasing fluid intake intravenously. Infants with low blood pressure due to blood loss may be given a blood transfusion. PATENT DUCTUS ARTERIOSUS What is patent ductus arteriosus (PDA)? The ductus is a blood vessel connecting the main vessel leading to the lungs (pulmonary artery) to the main vessel of the body (aorta). Its function in the unborn baby is to allow blood to bypass the lungs, since oxygen for the blood comes from the mother and not from breathing air. Normally after birth the ductus gradually narrows and then closes in the first few hours to days, but in premature infants, this blood vessel may stay open. Is patent ductus arteriosus dangerous? The opening of this blood vessel causes too much blood to be pumped into the baby's lungs. This leads to an increase in fluid in the lungs and it makes it harder for the baby to breathe. PDA also increases the work of the heart and can lead to lung failure. How is patent ductus arteriosus treated? If the ductus is very small and there is only a tiny amount of blood flowing through it, the doctors may wait to see if it closes on its own. Treatment  methods in more severe cases involve giving medications and decreasing baby's fluid intake. If all else fails, then surgery may be required to close the ductus. RESPIRATORY DISTRESS SYNDROME (RDS) Although there are many causes of breathing difficulties in premature infants, the most common one is RDS. RDS happens when the infant's immature lungs do not produce enough of an important substance called surfactant. A healthy amount of surfactant spreads like a film over the tiny air sacs of the lungs, allowing them to stay open. Open air sacs are essential for oxygen to enter the blood from the lungs and for carbon dioxide to be released from the blood into the lungs  for exhalation (breathing out). Is RDS dangerous? Some possible long-term problems may come from RDS. If the case is severe or if there have been complications, possible problems may include:  Increased severity of colds or other respiratory infections   Sensitivity to lung irritants such as smoke and pollution   Infection of the bloodstream   Bleeding in the brain   Lung scarring   Greater likelihood of wheezing or other asthma-like problems  How is RDS treated? A baby with RDS will need extra oxygen. The added oxygen might be given by placing a plastic hood over the baby's head or through little tubes in the nostrils. If the RDS is moderate or severe, your baby may need to have a breathing tube inserted into her wind pipe. She may also be given an artificial form of surfactant, replacing the substance that her lungs lack. RETINOPATHY OF PREMATURITY What is retinopathy of prematurity (ROP)? This complication is abnormal growth of the blood vessels in an infant's eye. The development of blood vessels in the eye is completed just a few weeks before the normal time of delivery. In premature babies this process is not complete. Is ROP dangerous? ROP can have long-term effects on a baby's vision. Premature infants more frequently need glasses in early childhood than children who were not premature. A baby with ROP is also more likely than most children to develop a "lazy" eye or wandering eye. Severe ROP can lead to blindness. How is ROP treated? Most cases of ROP do not require any treatment; they resolve on their own. In severe cases, the ends of the vessels in the inner lining of the eye will be treated to prevent further abnormal growth. TRANSIENT TACHYPNEA What is transient tachypnea? Transient tachypnea is rapid breathing due to slow re-absorption of fetal lung fluid. In the womb, a baby's lungs continuously make fluid. Some of this fluid is squeezed out as the baby comes down the birth canal.  The rest must be absorbed by the baby's tissues during the first minutes to hours of life. Is transient tachypnea dangerous? No. The breathing abnormalities may last hours or days, but should disappear on their own and will not present long-term problems. How is transient tachypnea treated? A baby with transient tachypnea will have his or her respiration, heart rate, and oxygenation watched closely. Some babies may also need additional oxygen to be given through a hood or a small tube inserted in baby's nose, which can help keep the fluid out of the lungs' air sacs and speed up its reabsorption. If your baby develops any of the above problems, your caregiver will go over these problems with you and help you understand them. Ask questions about anything you are unsure of. Document Released: 10/10/2004 Document Revised: 06/21/2011 Document Reviewed: 10/07/2008 [facility] Patient Information 2012 [place], [place].Pelvic Rest Pelvic  rest is sometimes recommended for women when:   The placenta is partially or completely covering the opening of the cervix (placenta previa).   There is bleeding between the uterine wall and the amniotic sac in the first trimester (subchorionic hemorrhage).   The cervix begins to open without labor starting (incompetent cervix, cervical insufficiency).   The labor is too early (preterm labor).  HOME CARE INSTRUCTIONS  Do not have sexual intercourse, stimulation, or an orgasm.   Do not use tampons, douche, or put anything in the vagina.   Do not lift anything over 10 pounds (4.5 kg).   Avoid strenuous activity or straining your pelvic muscles.  SEEK MEDICAL CARE IF:  You have any vaginal bleeding during pregnancy. Treat this as a potential emergency.   You have cramping pain felt low in the stomach (stronger than menstrual cramps).   You notice vaginal discharge (watery, mucus, or bloody).   You have a low, dull backache.   There are regular contractions or  uterine tightening.  SEEK IMMEDIATE MEDICAL CARE IF: You have vaginal bleeding and have placenta previa.  Document Released: 02/03/2011 Document Revised: 09/28/2011 Document Reviewed: 02/03/2011 [facility] Patient Information 2012 [place], [place].

## 2012-04-13 NOTE — Progress Notes (Signed)
Patient ID: Deanna Sparks, female   DOB: 03/15/1976, 36 y.o.   MRN: [id number] Hospital Day: 47  S: Preterm labor symptoms: Advanced cervical change.  O: Blood pressure 117/61, pulse 104, temperature 98 F (36.7 C), temperature source Oral, resp. rate 20, height 5\' 8"  (1.727 m), weight 113.989 kg (251 lb 4.8 oz), last menstrual period 09/06/2011, SpO2 97.00%.   NWG:NFAOZHYQ: 150 bpm Toco: None SVE:  omitted  A/P- 36 y.o. admitted with:Cervical dilation, footling breech.  Vertex on informal ultrasound today.  Will discharge home on modified bedrest.    Pregnancy Complications: preterm labor   Preterm labor management: modified bedrest advised Dating:  [redacted]w[redacted]d PNL Needed:  None FWB:  good PTL:  stable

## 2012-04-26 ENCOUNTER — Other Ambulatory Visit: Payer: Self-pay | Admitting: Obstetrics

## 2012-04-26 DIAGNOSIS — O139 Gestational [pregnancy-induced] hypertension without significant proteinuria, unspecified trimester: Secondary | ICD-10-CM

## 2012-04-26 DIAGNOSIS — O34219 Maternal care for unspecified type scar from previous cesarean delivery: Secondary | ICD-10-CM

## 2012-04-26 DIAGNOSIS — O09529 Supervision of elderly multigravida, unspecified trimester: Secondary | ICD-10-CM

## 2012-04-27 ENCOUNTER — Encounter (HOSPITAL_COMMUNITY): Payer: Self-pay | Admitting: *Deleted

## 2012-04-27 ENCOUNTER — Inpatient Hospital Stay (HOSPITAL_COMMUNITY)
Admission: AD | Admit: 2012-04-27 | Discharge: 2012-04-28 | Disposition: A | Discharge status: Home / Self Care | Payer: BC Managed Care – PPO | Source: Ambulatory Visit | Attending: Obstetrics | Admitting: Obstetrics

## 2012-04-27 DIAGNOSIS — O99891 Other specified diseases and conditions complicating pregnancy: Secondary | ICD-10-CM | POA: Insufficient documentation

## 2012-04-27 DIAGNOSIS — O288 Other abnormal findings on antenatal screening of mother: Secondary | ICD-10-CM

## 2012-04-27 DIAGNOSIS — N949 Unspecified condition associated with female genital organs and menstrual cycle: Secondary | ICD-10-CM | POA: Insufficient documentation

## 2012-04-27 DIAGNOSIS — O368390 Maternal care for abnormalities of the fetal heart rate or rhythm, unspecified trimester, not applicable or unspecified: Secondary | ICD-10-CM | POA: Insufficient documentation

## 2012-04-27 DIAGNOSIS — N898 Other specified noninflammatory disorders of vagina: Secondary | ICD-10-CM

## 2012-04-27 LAB — URINE MICROSCOPIC-ADD ON

## 2012-04-27 LAB — URINALYSIS, ROUTINE W REFLEX MICROSCOPIC
Bilirubin Urine: NEGATIVE
Ketones, ur: 15 mg/dL — AB
Leukocytes, UA: NEGATIVE
Nitrite: NEGATIVE
Protein, ur: NEGATIVE mg/dL
Urobilinogen, UA: 1 mg/dL (ref 0.0–1.0)
pH: 6 (ref 5.0–8.0)

## 2012-04-27 NOTE — MAU Note (Signed)
Pt states, " I have been feeling wet today, and I want to be sure I'm not leaking fluid."

## 2012-04-28 ENCOUNTER — Encounter (HOSPITAL_COMMUNITY): Payer: Self-pay

## 2012-04-28 ENCOUNTER — Inpatient Hospital Stay (HOSPITAL_COMMUNITY): Payer: BC Managed Care – PPO | Admitting: Anesthesiology

## 2012-04-28 ENCOUNTER — Inpatient Hospital Stay (HOSPITAL_COMMUNITY): Payer: BC Managed Care – PPO

## 2012-04-28 ENCOUNTER — Encounter (HOSPITAL_COMMUNITY): Payer: Self-pay | Admitting: Anesthesiology

## 2012-04-28 ENCOUNTER — Inpatient Hospital Stay (HOSPITAL_COMMUNITY)
Admission: AD | Admit: 2012-04-28 | Discharge: 2012-05-01 | DRG: 373 | Disposition: A | Discharge status: Home / Self Care | Payer: BC Managed Care – PPO | Source: Ambulatory Visit | Attending: Obstetrics | Admitting: Obstetrics

## 2012-04-28 DIAGNOSIS — O09529 Supervision of elderly multigravida, unspecified trimester: Secondary | ICD-10-CM | POA: Diagnosis present

## 2012-04-28 DIAGNOSIS — N898 Other specified noninflammatory disorders of vagina: Secondary | ICD-10-CM

## 2012-04-28 DIAGNOSIS — O34219 Maternal care for unspecified type scar from previous cesarean delivery: Principal | ICD-10-CM | POA: Diagnosis present

## 2012-04-28 DIAGNOSIS — O99892 Other specified diseases and conditions complicating childbirth: Secondary | ICD-10-CM | POA: Diagnosis present

## 2012-04-28 DIAGNOSIS — O99820 Streptococcus B carrier state complicating pregnancy: Secondary | ICD-10-CM

## 2012-04-28 DIAGNOSIS — O4290 Premature rupture of membranes, unspecified as to length of time between rupture and onset of labor, unspecified weeks of gestation: Secondary | ICD-10-CM

## 2012-04-28 DIAGNOSIS — Z22330 Carrier of Group B streptococcus: Secondary | ICD-10-CM

## 2012-04-28 DIAGNOSIS — O26899 Other specified pregnancy related conditions, unspecified trimester: Secondary | ICD-10-CM

## 2012-04-28 DIAGNOSIS — O328XX0 Maternal care for other malpresentation of fetus, not applicable or unspecified: Secondary | ICD-10-CM

## 2012-04-28 DIAGNOSIS — O4700 False labor before 37 completed weeks of gestation, unspecified trimester: Secondary | ICD-10-CM

## 2012-04-28 HISTORY — DX: Essential (primary) hypertension: I10

## 2012-04-28 LAB — PREPARE RBC (CROSSMATCH)

## 2012-04-28 LAB — CBC
HCT: 37.5 % (ref 36.0–46.0)
Hemoglobin: 12.5 g/dL (ref 12.0–15.0)
MCH: 29.1 pg (ref 26.0–34.0)
MCHC: 33.3 g/dL (ref 30.0–36.0)

## 2012-04-28 LAB — POCT FERN TEST: Fern Test: NEGATIVE

## 2012-04-28 MED ORDER — MAGNESIUM SULFATE BOLUS VIA INFUSION
6.0000 g | Freq: Once | INTRAVENOUS | Status: AC
  Administered 2012-04-28: 6 g via INTRAVENOUS
  Filled 2012-04-28: qty 500

## 2012-04-28 MED ORDER — PHENYLEPHRINE 40 MCG/ML (10ML) SYRINGE FOR IV PUSH (FOR BLOOD PRESSURE SUPPORT)
80.0000 ug | PREFILLED_SYRINGE | INTRAVENOUS | Status: DC | PRN
  Administered 2012-04-28 (×2): 80 ug via INTRAVENOUS
  Filled 2012-04-28 (×2): qty 5

## 2012-04-28 MED ORDER — PRENATAL MULTIVITAMIN CH: 1.0000 | ORAL_TABLET | Freq: Every day | ORAL | Status: DC

## 2012-04-28 MED ORDER — FENTANYL 2.5 MCG/ML BUPIVACAINE 1/10 % EPIDURAL INFUSION (WH - ANES)
14.0000 mL/h | INTRAMUSCULAR | Status: DC
  Administered 2012-04-29 (×5): 14 mL/h via EPIDURAL
  Filled 2012-04-28 (×6): qty 60

## 2012-04-28 MED ORDER — BUTORPHANOL TARTRATE 2 MG/ML IJ SOLN
1.0000 mg | INTRAMUSCULAR | Status: DC | PRN
  Administered 2012-04-28 (×2): 1 mg via INTRAVENOUS
  Filled 2012-04-28: qty 1

## 2012-04-28 MED ORDER — SODIUM CHLORIDE 0.9 % IV SOLN
250.0000 mg | Freq: Four times a day (QID) | INTRAVENOUS | Status: DC
  Administered 2012-04-28 – 2012-04-29 (×3): 250 mg via INTRAVENOUS
  Filled 2012-04-28 (×5): qty 250

## 2012-04-28 MED ORDER — LACTATED RINGERS IV SOLN
INTRAVENOUS | Status: DC
  Administered 2012-04-28: 500 mL/h via INTRAVENOUS
  Administered 2012-04-28: 20:00:00 via INTRAVENOUS
  Administered 2012-04-28: 125 mL/h via INTRAVENOUS
  Administered 2012-04-29 (×3): via INTRAVENOUS

## 2012-04-28 MED ORDER — CALCIUM CARBONATE ANTACID 500 MG PO CHEW: 2.0000 | CHEWABLE_TABLET | ORAL | Status: DC | PRN

## 2012-04-28 MED ORDER — LACTATED RINGERS IV SOLN: 500.0000 mL | Freq: Once | INTRAVENOUS | Status: DC

## 2012-04-28 MED ORDER — DOCUSATE SODIUM 100 MG PO CAPS: 100.0000 mg | ORAL_CAPSULE | Freq: Every day | ORAL | Status: DC

## 2012-04-28 MED ORDER — MAGNESIUM SULFATE 40 G IN LACTATED RINGERS - SIMPLE
1.0000 g/h | Freq: Once | INTRAVENOUS | Status: DC
  Filled 2012-04-28: qty 500

## 2012-04-28 MED ORDER — DIPHENHYDRAMINE HCL 50 MG/ML IJ SOLN: 12.5000 mg | INTRAMUSCULAR | Status: DC | PRN

## 2012-04-28 MED ORDER — EPHEDRINE 5 MG/ML INJ
10.0000 mg | INTRAVENOUS | Status: AC | PRN
  Administered 2012-04-28 (×2): 10 mg via INTRAVENOUS
  Filled 2012-04-28: qty 4

## 2012-04-28 MED ORDER — PHENYLEPHRINE 40 MCG/ML (10ML) SYRINGE FOR IV PUSH (FOR BLOOD PRESSURE SUPPORT)
80.0000 ug | PREFILLED_SYRINGE | INTRAVENOUS | Status: AC | PRN
  Administered 2012-04-28 (×2): 80 ug via INTRAVENOUS
  Administered 2012-04-28: 160 ug via INTRAVENOUS
  Filled 2012-04-28 (×2): qty 5

## 2012-04-28 MED ORDER — MAGNESIUM SULFATE 40 G IN LACTATED RINGERS - SIMPLE
2.0000 g/h | INTRAVENOUS | Status: DC
  Administered 2012-04-28: 2 g/h via INTRAVENOUS
  Filled 2012-04-28: qty 500

## 2012-04-28 MED ORDER — SODIUM CHLORIDE 0.9 % IV SOLN
2.0000 g | Freq: Once | INTRAVENOUS | Status: AC
  Administered 2012-04-28: 2 g via INTRAVENOUS
  Filled 2012-04-28: qty 2000

## 2012-04-28 MED ORDER — SODIUM BICARBONATE 8.4 % IV SOLN
INTRAVENOUS | Status: DC | PRN
  Administered 2012-04-28: 4 mL via EPIDURAL

## 2012-04-28 MED ORDER — ACETAMINOPHEN 325 MG PO TABS: 650.0000 mg | ORAL_TABLET | ORAL | Status: DC | PRN

## 2012-04-28 MED ORDER — FENTANYL 2.5 MCG/ML BUPIVACAINE 1/10 % EPIDURAL INFUSION (WH - ANES)
INTRAMUSCULAR | Status: DC | PRN
  Administered 2012-04-28: 14 mL/h via EPIDURAL

## 2012-04-28 MED ORDER — EPHEDRINE 5 MG/ML INJ
10.0000 mg | INTRAVENOUS | Status: DC | PRN
  Filled 2012-04-28: qty 4

## 2012-04-28 MED ORDER — SODIUM CHLORIDE 0.9 % IV SOLN
500.0000 mg | Freq: Once | INTRAVENOUS | Status: AC
  Administered 2012-04-28: 500 mg via INTRAVENOUS
  Filled 2012-04-28: qty 500

## 2012-04-28 MED ORDER — ZOLPIDEM TARTRATE 5 MG PO TABS: 5.0000 mg | ORAL_TABLET | Freq: Every evening | ORAL | Status: DC | PRN

## 2012-04-28 NOTE — Progress Notes (Signed)
Dr. Jean Rosenthal notified of bp- gave orders to give Phenylephrine 4ml now.

## 2012-04-28 NOTE — H&P (Signed)
This is Dr. 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 A date 06/22/2012 she's also on Valtrex 500 mg daily and her membranes ruptured spontaneously day and she was contracting she was given magnesium 6 g loading and IV and 2 g now her cervix is 4 cm 100% and vertex -2 station she was started on ampicillin 2 g IV and erythromycin 500 mg IV and 250 IV every 6 hours and her GBS is positive her contractions have no, S. 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

## 2012-04-28 NOTE — MAU Provider Note (Signed)
Chief Complaint:  Rupture of Membranes   First Provider Initiated Contact with Patient 04/28/12 0102      HPI  Deanna Sparks is a 36 y.o. G3P2002 at [redacted]w[redacted]d presenting with leaking small amount of milky fluid throughout the day and mild, irreg UC's unchanged since admission for PTL last month.  Cervix 2-3 cm at last exam per pt. Denies vaginal bleeding. Good fetal movement.   Past Medical History: Past Medical History  Diagnosis Date  . Genital herpes     last outbreak 12/2011    Past Surgical History: Past Surgical History  Procedure Date  . Cesarean section     Family History: Family History  Problem Relation Age of Onset  . Hypertension Mother   . Anesthesia problems Neg Hx   . Other Neg Hx     Social History: History  Substance Use Topics  . Smoking status: Never Smoker   . Smokeless tobacco: Not on file  . Alcohol Use: No    Allergies: No Known Allergies  Meds:  Prescriptions prior to admission  Medication Sig Dispense Refill  . Cholecalciferol (VITAMIN D) 400 UNITS capsule Take 400 Units by mouth daily.      . metoprolol tartrate (LOPRESSOR) 25 MG tablet Take 25 mg by mouth 2 (two) times daily. Pt unsure of dosage       . Prenatal Vit-Fe Fumarate-FA (PRENATAL MULTIVITAMIN) TABS Take 1 tablet by mouth every morning.       . docusate sodium 100 MG CAPS Take 100 mg by mouth daily.  24 capsule  11      Physical Exam  Blood pressure 137/88, pulse 116, temperature 98.5 F (36.9 C), temperature source Oral, resp. rate 20, height 5' 8.25" (1.734 m), weight 114.873 kg (253 lb 4 oz), last menstrual period 09/06/2011. Patient Vitals for the past 24 hrs:  BP Temp Temp src Pulse Resp Height Weight  04/28/12 0103 111/78 mmHg - - 103  - - -  04/28/12 0048 126/75 mmHg - - 102  - - -  04/28/12 0033 130/73 mmHg - - 102  - - -  04/28/12 0018 130/78 mmHg - - 104  - - -  04/28/12 0003 136/83 mmHg - - 105  - - -  04/27/12 2344 137/88 mmHg 98.5 F (36.9 C) Oral 116  20   5' 8.25" (1.734 m) 114.873 kg (253 lb 4 oz)    GENERAL: Well-developed, well-nourished female in no acute distress.  HEENT: normocephalic HEART: mild tachycardia RESP: normal effort ABDOMEN: Soft, nontender, gravid appropriate for gestational age EXTREMITIES: Nontender, no edema NEURO: alert and oriented  SPECULUM EXAM: Small amount of thin, white, odorless discharge, neg pool [place] neg  Dilation: 2.5 Effacement (%): 80 Exam by:: V Domnic Vantol CNM Unchanged   FHT:  Baseline 140's , moderate variability, 10x10 accelerations present, mild variable decelerations Contractions: 1 UC w/ UI in 90 minutes   Labs: Results for orders placed during the hospital encounter of 04/27/12 (from the past 24 hour(s))  URINALYSIS, ROUTINE W REFLEX MICROSCOPIC     Status: Abnormal   Collection Time   04/27/12 11:30 PM      Component Value Range   Color, Urine YELLOW  YELLOW   APPearance CLEAR  CLEAR   Specific Gravity, Urine 1.025  1.005 - 1.030   pH 6.0  5.0 - 8.0   Glucose, UA NEGATIVE  NEGATIVE mg/dL   Hgb urine dipstick TRACE (*) NEGATIVE   Bilirubin Urine NEGATIVE  NEGATIVE   Ketones,  ur 15 (*) NEGATIVE mg/dL   Protein, ur NEGATIVE  NEGATIVE mg/dL   Urobilinogen, UA 1.0  0.0 - 1.0 mg/dL   Nitrite NEGATIVE  NEGATIVE   Leukocytes, UA NEGATIVE  NEGATIVE  URINE MICROSCOPIC-ADD ON     Status: Abnormal   Collection Time   04/27/12 11:30 PM      Component Value Range   Squamous Epithelial / LPF FEW (*) RARE   WBC, UA 0-2  <3 WBC/hpf   RBC / HPF 0-2  <3 RBC/hpf   Bacteria, UA RARE  RARE   Urine-Other MUCOUS PRESENT    POCT FERN TEST     Status: Normal   Collection Time   04/28/12  1:13 AM      Component Value Range   Fern Test Negative     Imaging:  BPP 6/8 (2 off for absence of fetal breathing mvmt)   Assessment: 1. Vaginal discharge in pregnancy   2. Non-reactive NST (non-stress test). BPP 6/8. Fetal status reassuring for gestational age.   Plan: D/C home per consult w/ Dr. Gaynell Face PTL  precautions, [facility] Medication List  As of 04/28/2012  2:26 AM   CONTINUE taking these medications         DSS 100 MG Caps   Take 100 mg by mouth daily.      metoprolol tartrate 25 MG tablet   Commonly known as: LOPRESSOR      prenatal multivitamin Tabs      Vitamin D 400 UNITS capsule           Follow-up Information    Follow up with [facility] on 05/01/2012. (or MAU as needed if symptoms worsen)    Contact information:   [address] [place] [address]         Dorathy Kinsman 7/7/20131:14 AM

## 2012-04-28 NOTE — Progress Notes (Signed)
Written and verbal d/c instructions given and understanding voiced. 

## 2012-04-28 NOTE — Anesthesia Procedure Notes (Signed)
Epidural Patient location during procedure: OB  Preanesthetic Checklist Completed: patient identified, site marked, surgical consent, pre-op evaluation, timeout performed, IV checked, risks and benefits discussed and monitors and equipment checked  Epidural Patient position: sitting Prep: site prepped and draped and DuraPrep Patient monitoring: continuous pulse ox and blood pressure Approach: midline Injection technique: LOR air  Needle:  Needle type: Tuohy  Needle gauge: 17 G Needle length: 9 cm Needle insertion depth: 7 cm Catheter type: closed end flexible Catheter size: 19 Gauge Catheter at skin depth: 14 cm Test dose: negative  Assessment Events: blood not aspirated, injection not painful, no injection resistance, negative IV test and no paresthesia  Additional Notes Dosing of Epidural:  1st dose, through needle ............................................. epi 1:200K + Xylocaine 40 mg  2nd dose, through catheter, after waiting 3 minutes.....epi 1:200K + Xylocaine 40 mg  3rd dose, through catheter after waiting 3 minutes .............................Marcaine   4mg   ( mg Marcaine are expressed as equivilent  cc's medication removed from the 0.1%Bupiv / fentanyl syringe from L&D pump)  ( 2% Xylo charted as a single dose in Epic Meds for ease of charting; actual dosing was fractionated as above, for saftey's sake)  As each dose occurred, patient was free of IV sx; and patient exhibited no evidence of SA injection.  Patient is more comfortable after epidural dosed. Please see RN's note for documentation of vital signs,and FHR which are stable.  Patient reminded not to try to ambulate with numb legs, and that an RN must be present the 1st time she attempts to get up.    

## 2012-04-28 NOTE — Progress Notes (Signed)
Dr. Sherron Ales, notified of pt's bp, right arm discomfort, gave orders to turn epidural pump off, will come and evaluate pt.

## 2012-04-28 NOTE — Progress Notes (Signed)
Notified of pt's pain and epidural request, SVE, will procede with epidural placement and turn magnesium sulfate off.

## 2012-04-28 NOTE — Progress Notes (Signed)
NICU physician notified of pt's status, requested NICU consult.

## 2012-04-28 NOTE — MAU Note (Signed)
Pt returned from u/s. Ok not to reapply EFM per Ivonne Andrew CNM

## 2012-04-28 NOTE — Anesthesia Preprocedure Evaluation (Signed)
Anesthesia Evaluation  Patient identified by MRN, date of birth, ID band Patient awake    Reviewed: Allergy & Precautions, H&P , Patient's Chart, lab work & pertinent test results  Airway Mallampati: II TM Distance: >3 FB Neck ROM: full    Dental  (+) Teeth Intact   Pulmonary  breath sounds clear to auscultation        Cardiovascular Pt. on medications Rhythm:regular Rate:Normal     Neuro/Psych    GI/Hepatic   Endo/Other  Morbid obesity  Renal/GU      Musculoskeletal   Abdominal   Peds  Hematology   Anesthesia Other Findings       Reproductive/Obstetrics (+) Pregnancy                           Anesthesia Physical Anesthesia Plan  ASA: III  Anesthesia Plan: Epidural   Post-op Pain Management:    Induction:   Airway Management Planned:   Additional Equipment:   Intra-op Plan:   Post-operative Plan:   Informed Consent: I have reviewed the patients History and Physical, chart, labs and discussed the procedure including the risks, benefits and alternatives for the proposed anesthesia with the patient or authorized representative who has indicated his/her understanding and acceptance.   Dental Advisory Given  Plan Discussed with:   Anesthesia Plan Comments: (Labs checked- platelets confirmed with RN in room. Fetal heart tracing, per RN, reported to be stable enough for sitting procedure. Discussed epidural, and patient consents to the procedure:  included risk of possible headache,backache, failed block, allergic reaction, and nerve injury. This patient was asked if she had any questions or concerns before the procedure started. )        Anesthesia Quick Evaluation

## 2012-04-28 NOTE — Progress Notes (Signed)
To us ambulatory

## 2012-04-28 NOTE — MAU Note (Signed)
Pt here last night for leaking sent home. States now her sheets are getting wet as well. Reports occasional mild contraction and fetal movement a little less than normal.

## 2012-04-28 NOTE — MAU Provider Note (Signed)
History     CSN: [id number]  Arrival date and time: 04/28/12 1148   First Provider Initiated Contact with Patient 04/28/12 1314      Chief Complaint  Patient presents with  . Rupture of Membranes   HPI Deanna Sparks is 36 y.o. G3P2002 [redacted]w[redacted]d weeks presenting with report of gush of vaginal fluid X 3 this morning.  She was here last night with a non reactive NST, BPP 6/8.  She denies contractions or vaginal bleeding.  On last exam she was 2-3 cm dilated.  She has been on bedrest since 27 weeks for preterm labor with this pregnancy.  Last 2 deliveries were term.    Past Medical History  Diagnosis Date  . Genital herpes     last outbreak 12/2011    Past Surgical History  Procedure Date  . Cesarean section     Family History  Problem Relation Age of Onset  . Hypertension Mother   . Anesthesia problems Neg Hx   . Other Neg Hx     History  Substance Use Topics  . Smoking status: Never Smoker   . Smokeless tobacco: Not on file  . Alcohol Use: No    Allergies: No Known Allergies  Prescriptions prior to admission  Medication Sig Dispense Refill  . acyclovir (ZOVIRAX) 400 MG tablet Take 400 mg by mouth daily.      . Cholecalciferol (VITAMIN D) 400 UNITS capsule Take 400 Units by mouth daily.      . methyldopa (ALDOMET) 500 MG tablet Take 500 mg by mouth 2 (two) times daily.      . Prenatal Vit-Fe Fumarate-FA (PRENATAL MULTIVITAMIN) TABS Take 1 tablet by mouth every morning.       Marland Kitchen DISCONTD: docusate sodium 100 MG CAPS Take 100 mg by mouth daily.  24 capsule  11    Review of Systems  Gastrointestinal: Negative for nausea, vomiting and abdominal pain.  Genitourinary:       + for leaking of vaginal fluid.   Physical Exam   Blood pressure 121/73, pulse 118, temperature 98.6 F (37 C), temperature source Oral, resp. rate 18, height 5' 8.25" (1.734 m), weight 115.577 kg (254 lb 12.8 oz), last menstrual period 09/06/2011.  Physical Exam  Constitutional: She is  oriented to person, place, and time. She appears well-developed and well-nourished.  HENT:  Head: Normocephalic.  Neck: Normal range of motion.  Cardiovascular: Normal rate.   Respiratory: Effort normal.  GI: There is no tenderness.  Genitourinary:       + for leaking of fluid from vagina and + for pooling of fluid in the vaginal canal.   Negative for vaginal bleeding.  Cervical exam by Elnita Maxwell, RN 3-4cm 100% effaced and -2 station.    Neurological: She is alert and oriented to person, place, and time.  Psychiatric: She has a normal mood and affect. Her behavior is normal.    MAU Course  Procedures  FETAL MONITOR STRIP--baseline fhr 140  occ contraction.  1 10x10 accel noted.  IV LR ordered.   MDM  14:13  Per Elnita Maxwell, RN Dr. Gaynell Face was called but in OR and could not talk.  At 14:10 she began to have more discomfort.  IV now being bolused.  She tried Dr. Gaynell Face, he is in AICU and unable to talk.  I called Dr. Macon Large and she gave orders for Beta Methasone and Magnesium sulfate at 6gm loading dose and 2 grams every hour.   Assessment and Plan  A:  Rupture of membranes at [redacted]w[redacted]d gestation  P:  Dr. Gaynell Face, who is on call for Dr. Clearance Coots called  Admit.  Deanna Sparks,EVE M 04/28/2012, 1:16 PM

## 2012-04-28 NOTE — Plan of Care (Signed)
Problem: Consults Goal: Birthing Suites Patient Information Press F2 to bring up selections list   Pt < [redacted] weeks EGA     

## 2012-04-28 NOTE — Progress Notes (Signed)
NICU given update on pt's progress

## 2012-04-28 NOTE — Progress Notes (Signed)
Spec exam done for r/o rupture. Fern slide made. Pt tol well

## 2012-04-29 ENCOUNTER — Encounter (HOSPITAL_COMMUNITY): Payer: Self-pay | Admitting: *Deleted

## 2012-04-29 MED ORDER — PRENATAL MULTIVITAMIN CH
1.0000 | ORAL_TABLET | Freq: Every day | ORAL | Status: DC
  Administered 2012-04-30 – 2012-05-01 (×2): 1 via ORAL
  Filled 2012-04-29 (×2): qty 1

## 2012-04-29 MED ORDER — SENNOSIDES-DOCUSATE SODIUM 8.6-50 MG PO TABS
2.0000 | ORAL_TABLET | Freq: Every day | ORAL | Status: DC
  Administered 2012-04-29: 2 via ORAL

## 2012-04-29 MED ORDER — OXYCODONE-ACETAMINOPHEN 5-325 MG PO TABS
1.0000 | ORAL_TABLET | ORAL | Status: DC | PRN
  Administered 2012-04-30: 2 via ORAL
  Administered 2012-04-30 – 2012-05-01 (×3): 1 via ORAL
  Filled 2012-04-29 (×3): qty 1
  Filled 2012-04-29: qty 2

## 2012-04-29 MED ORDER — TERBUTALINE SULFATE 1 MG/ML IJ SOLN: 0.2500 mg | Freq: Once | INTRAMUSCULAR | Status: DC | PRN

## 2012-04-29 MED ORDER — LANOLIN HYDROUS EX OINT: TOPICAL_OINTMENT | CUTANEOUS | Status: DC | PRN

## 2012-04-29 MED ORDER — OXYTOCIN 40 UNITS IN LACTATED RINGERS INFUSION - SIMPLE MED
1.0000 m[IU]/min | INTRAVENOUS | Status: DC
  Administered 2012-04-29: 1 m[IU]/min via INTRAVENOUS
  Filled 2012-04-29: qty 1000

## 2012-04-29 MED ORDER — IBUPROFEN 600 MG PO TABS
600.0000 mg | ORAL_TABLET | Freq: Four times a day (QID) | ORAL | Status: DC
  Administered 2012-04-29 – 2012-05-01 (×6): 600 mg via ORAL
  Filled 2012-04-29 (×7): qty 1

## 2012-04-29 MED ORDER — TETANUS-DIPHTH-ACELL PERTUSSIS 5-2.5-18.5 LF-MCG/0.5 IM SUSP
0.5000 mL | Freq: Once | INTRAMUSCULAR | Status: DC
  Filled 2012-04-29: qty 0.5

## 2012-04-29 MED ORDER — ZOLPIDEM TARTRATE 5 MG PO TABS: 5.0000 mg | ORAL_TABLET | Freq: Every evening | ORAL | Status: DC | PRN

## 2012-04-29 MED ORDER — WITCH HAZEL-GLYCERIN EX PADS: 1.0000 "application " | MEDICATED_PAD | CUTANEOUS | Status: DC | PRN

## 2012-04-29 MED ORDER — ONDANSETRON HCL 4 MG PO TABS: 4.0000 mg | ORAL_TABLET | ORAL | Status: DC | PRN

## 2012-04-29 MED ORDER — SIMETHICONE 80 MG PO CHEW: 80.0000 mg | CHEWABLE_TABLET | ORAL | Status: DC | PRN

## 2012-04-29 MED ORDER — OXYTOCIN 40 UNITS IN LACTATED RINGERS INFUSION - SIMPLE MED: 62.5000 mL/h | INTRAVENOUS | Status: DC | PRN

## 2012-04-29 MED ORDER — DIPHENHYDRAMINE HCL 25 MG PO CAPS: 25.0000 mg | ORAL_CAPSULE | Freq: Four times a day (QID) | ORAL | Status: DC | PRN

## 2012-04-29 MED ORDER — ONDANSETRON HCL 4 MG/2ML IJ SOLN: 4.0000 mg | INTRAMUSCULAR | Status: DC | PRN

## 2012-04-29 MED ORDER — BENZOCAINE-MENTHOL 20-0.5 % EX AERO: 1.0000 "application " | INHALATION_SPRAY | CUTANEOUS | Status: DC | PRN

## 2012-04-29 MED ORDER — DIBUCAINE 1 % RE OINT: 1.0000 "application " | TOPICAL_OINTMENT | RECTAL | Status: DC | PRN

## 2012-04-29 MED ORDER — MEDROXYPROGESTERONE ACETATE 150 MG/ML IM SUSP: 150.0000 mg | INTRAMUSCULAR | Status: DC | PRN

## 2012-04-29 NOTE — Progress Notes (Signed)
Patient ID: TEIRA ARCILLA, female   DOB: September 20, 1976, 36 y.o.   MRN: [id number] Hospital Day: 2  S: Preterm labor symptoms: fluid leakage and UC's.  O: Blood pressure 123/65, pulse 118, temperature 98.5 F (36.9 C), temperature source Oral, resp. rate 20, height 5\' 8"  (1.727 m), weight 115.214 kg (254 lb), last menstrual period 09/06/2011, SpO2 96.00%.   WUJ:WJXBJYNW: 140-150.  Mild variables. bpm Toco: Uterine irritability. GNF:AOZHYQMV: 4 Effacement (%): 100 Cervical Position: Middle Station: 0 Presentation: Vertex Exam by:: Beatriz Stallion, RN  A/P- 36 y.o. admitted with:  SROM.  [redacted] weeks gestation.  Not in labor.  MFM consulted for management recommendation.  Patient Active Hospital Problem List: No active hospital problems.   Pregnancy Complications: SROM  Preterm labor management: Expectant. Dating:  [redacted]w[redacted]d PNL Needed:  PG for FLM. FWB:  good

## 2012-04-29 NOTE — Progress Notes (Signed)
MILLENIA WALDVOGEL is a 36 y.o. G3P2002 at [redacted]w[redacted]d by LMP admitted for rupture of membranes, Preterm labor  Subjective:   Objective: BP 100/57  Pulse 103  Temp 99.5 F (37.5 C) (Oral)  Resp 20  Ht 5\' 8"  (1.727 m)  Wt 115.214 kg (254 lb)  BMI 38.62 kg/m2  SpO2 96%  LMP 09/06/2011 I/O last 3 completed shifts: In: 1483.3 [P.O.:50; I.V.:1383.3; IV Piggyback:50] Out: 1825 [Urine:1825]    FHT:  FHR: 140-150 bpm, variability: moderate,  accelerations:  Present,  decelerations:  Present occasional variables, mild. UC:   regular, every 2-4 minutes SVE:   Dilation: Lip/rim Effacement (%): 100 Station: +1 Exam by:: Clearence Cheek RN  Labs: Lab Results  Component Value Date   WBC 13.8* 04/28/2012   HGB 12.5 04/28/2012   HCT 37.5 04/28/2012   MCV 87.4 04/28/2012   PLT 284 04/28/2012    Assessment / Plan: Augmentation of labor, progressing well  Labor: Progressing normally Preeclampsia:  n/a Fetal Wellbeing:  Category II Pain Control:  Epidural I/D:  n/a Anticipated MOD:  NSVD  HARPER,CHARLES A 04/29/2012, 5:13 PM

## 2012-04-29 NOTE — Progress Notes (Signed)
Ur chart review completed.  

## 2012-04-29 NOTE — Consult Note (Signed)
MFM note  Deanna Sparks is a 36 yo G3P2002 currently at 58 2/7 weeks who was admitted yesterday with Premature rupture of Membranes.  Patient was previously admitted due to advanced cervical dilation and received a course of betamethasone in early June.  She is currently on Ampicillin and Erythromycin for latency antibiotics.  She is GBS positive. Per exam yesterday, cervix was 4-5 cm dilated. Yesterday she had an epidural placed in anticipation of delivery.   She is currently not contracting.  Fetal strip reviewed - patient having some intermittent moderate variable decelerations.  Her prenatal course has been complicated by HSV (last outbreak about 3 months ago), GBS pos status, and gestational hypertension without hx of chronic hypertension prior to pregnancy.   POB hx:  Term SVD without comlications  C/section at 38 weeks due to active HSV lesions.   PMH- HSV, gestational hypertension   Meds - Valtrex, Lopressor 25 mg BID, baby ASA, Calcium/Vitamin D supplements, PNV, Pepcid/ Protonix prn   Social - non smoker, non drinker, denies illicit drug use   Fam hx neg for birth defects/ hereditary disorders  Impression: 1) IUP at 30 2/7 weeks 2) PPROM- s/p course of betamethasone in early June 3) GBS pos 4) hx of HSV 5) Chronic hypertension - BPs stable  Plan: Given moderate variable decelerations and advanced cervical dilation (s/p at least 1-2 cervical exams), do not feel that the patient is a good candidate for expectant management.  Recommend initiating pitocin augmentation and moving toward delivery.    Thank you for this referral.  Alpha Gula, MD

## 2012-04-30 ENCOUNTER — Encounter (HOSPITAL_COMMUNITY): Payer: Self-pay | Admitting: *Deleted

## 2012-04-30 LAB — CBC
MCV: 88.8 fL (ref 78.0–100.0)
Platelets: 239 10*3/uL (ref 150–400)
RBC: 3.66 MIL/uL — ABNORMAL LOW (ref 3.87–5.11)
RDW: 14.3 % (ref 11.5–15.5)
WBC: 19.8 10*3/uL — ABNORMAL HIGH (ref 4.0–10.5)

## 2012-04-30 NOTE — Anesthesia Postprocedure Evaluation (Signed)
  Anesthesia Post-op Note  Patient: Deanna Sparks  Procedure(s) Performed: * No procedures listed *  Patient Location: Women's Unit  Anesthesia Type: Epidural  Level of Consciousness: awake  Airway and Oxygen Therapy: Patient Spontanous Breathing  Post-op Pain: mild  Post-op Assessment: Patient's Cardiovascular Status Stable and Respiratory Function Stable  Post-op Vital Signs: stable  Complications: No apparent anesthesia complications

## 2012-04-30 NOTE — Progress Notes (Signed)
UR Chart review completed.  

## 2012-04-30 NOTE — Progress Notes (Signed)
Post Partum Day 1 Subjective: no complaints  Objective: Blood pressure 116/80, pulse 86, temperature 98 F (36.7 C), temperature source Oral, resp. rate 20, height 5\' 8"  (1.727 m), weight 115.214 kg (254 lb), last menstrual period 09/06/2011, SpO2 100.00%, unknown if currently breastfeeding.  Physical Exam:  General: alert and no distress Lochia: appropriate Uterine Fundus: firm Incision: none DVT Evaluation: No evidence of DVT seen on physical exam.   Basename 04/30/12 0535 04/28/12 1359  HGB 10.9* 12.5  HCT 32.5* 37.5    Assessment/Plan: Plan for discharge tomorrow   LOS: 2 days   Samik Balkcom A 04/30/2012, 7:26 AM

## 2012-05-01 MED ORDER — IBUPROFEN 600 MG PO TABS: 600.0000 mg | ORAL_TABLET | Freq: Four times a day (QID) | ORAL | Status: DC

## 2012-05-01 MED ORDER — OXYCODONE-ACETAMINOPHEN 5-325 MG PO TABS: 1.0000 | ORAL_TABLET | ORAL | Status: AC | PRN

## 2012-05-01 NOTE — Discharge Summary (Signed)
Obstetric Discharge Summary Reason for Admission: rupture of membranes and UC's Prenatal Procedures: ultrasound Intrapartum Procedures: spontaneous vaginal delivery Postpartum Procedures: none Complications-Operative and Postpartum: none Hemoglobin  Date Value Range Status  04/30/2012 10.9* 12.0 - 15.0 g/dL Final     HCT  Date Value Range Status  04/30/2012 32.5* 36.0 - 46.0 % Final    Physical Exam:  General: alert and no distress Lochia: appropriate Uterine Fundus: firm Incision: none DVT Evaluation: No evidence of DVT seen on physical exam.  Discharge Diagnoses: Premature labor and SROM  Discharge Information: Date: 05/01/2012 Activity: pelvic rest Diet: routine Medications: PNV, Ibuprofen, Colace and Percocet Condition: stable Instructions: refer to practice specific booklet Discharge to: home Follow-up Information    Follow up with HARPER,CHARLES A, MD. Schedule an appointment as soon as possible for a visit in 6 weeks.   Contact information:   [address] [place] [address] [phone]          Newborn Data: Live born female  Birth Weight: 4 lb 1.5 oz (1857 g) APGAR: 6, 8  Baby in NICU for prematurity.  HARPER,CHARLES A 05/01/2012, 5:08 AM

## 2012-05-01 NOTE — Progress Notes (Signed)
Teaching complete    Ambulated to nicu   Prior  To   Leaving

## 2012-05-01 NOTE — Clinical Social Work Maternal (Signed)
    Clinical Social Work Department PSYCHOSOCIAL ASSESSMENT - MATERNAL/CHILD 05/01/2012  Patient:  Deanna Sparks, Deanna Sparks  Account Number:  [id number]  Admit Date:  04/28/2012  Marjo Bicker Name:   Devonne Doughty    Clinical Social Worker:  Andy Gauss   Date/Time:  05/01/2012 10:45 AM  Date Referred:  05/01/2012   Referral source  NICU     Referred reason  NICU   Other referral source:    I:  FAMILY / HOME ENVIRONMENT Child's legal guardian:  PARENT  Guardian - Name Guardian - Age Guardian - Address  Taite Baldassari [address] [place], [place] [address]  Erin Sons 35    Other household support members/support persons Name Relationship DOB  Essie McDonald DAUGHTER 06/16/97  Coralee North DAUGHTER 04/28/96   Other support:    II  PSYCHOSOCIAL DATA Information Source:  Patient Interview  Surveyor, quantity and Community Resources Employment:   [employer]   Financial resources:  Media planner If [employer] - [place]:  [place] Other  Sales executive  WIC  Section 8   School / Grade:   Maternity Care Coordinator / Child Services Coordination / Early Interventions:  Cultural issues impacting care:    III  STRENGTHS Strengths  Adequate Resources  Home prepared for Child (including basic supplies)  Supportive family/friends   Strength comment:    IV  RISK FACTORS AND CURRENT PROBLEMS Current Problem:  YES   Risk Factor & Current Problem Patient Issue Family Issue Risk Factor / Current Problem Comment   N N     V  SOCIAL WORK ASSESSMENT Sw met with pt to offer emotional support and assess for resources.  Pt told Sw that she became emotional this morning when she thought about having to leave him here but states she is okay.  She denies any history of depression or SI.  She identified her mother, as her primary support person.  She is unsure if FOB will be involved.  Pt has some supplies for the infant but expressed need for a car seat.  Sw  informed pt about resources available through [employer].  She plans to take the baby to [facility].  Pt understands the importance of visitation and has reliable transportation. Sw will continue to follow assess for needs throughout hospitalization.      VI SOCIAL WORK PLAN Social Work Plan  Psychosocial Support/Ongoing Assessment of Needs   Type of pt/family education:   If child protective services report - county:   If child protective services report - date:   Information/referral to community resources comment:   Other social work plan:

## 2012-05-01 NOTE — Progress Notes (Signed)
Post Partum Day 2 Subjective: no complaints  Objective: Blood pressure 145/84, pulse 92, temperature 98.2 F (36.8 C), temperature source Oral, resp. rate 18, height 5\' 8"  (1.727 m), weight 115.214 kg (254 lb), last menstrual period 09/06/2011, SpO2 100.00%, unknown if currently breastfeeding.  Physical Exam:  General: alert and no distress Lochia: appropriate Uterine Fundus: firm Incision: none DVT Evaluation: No evidence of DVT seen on physical exam.   Basename 04/30/12 0535 04/28/12 1359  HGB 10.9* 12.5  HCT 32.5* 37.5    Assessment/Plan: Discharge home   LOS: 3 days   Erie Sica A 05/01/2012, 5:03 AM

## 2012-05-02 LAB — TYPE AND SCREEN
Antibody Screen: NEGATIVE
Unit division: 0
Unit division: 0
Unit division: 0

## 2012-05-04 ENCOUNTER — Encounter (HOSPITAL_COMMUNITY): Payer: Self-pay

## 2012-05-04 ENCOUNTER — Inpatient Hospital Stay (HOSPITAL_COMMUNITY)
Admission: AD | Admit: 2012-05-04 | Discharge: 2012-05-04 | Disposition: A | Discharge status: Home / Self Care | Payer: BC Managed Care – PPO | Source: Ambulatory Visit | Attending: Obstetrics & Gynecology | Admitting: Obstetrics & Gynecology

## 2012-05-04 DIAGNOSIS — N390 Urinary tract infection, site not specified: Secondary | ICD-10-CM | POA: Insufficient documentation

## 2012-05-04 DIAGNOSIS — O2390 Unspecified genitourinary tract infection in pregnancy, unspecified trimester: Secondary | ICD-10-CM | POA: Insufficient documentation

## 2012-05-04 DIAGNOSIS — O8620 Urinary tract infection following delivery, unspecified: Secondary | ICD-10-CM

## 2012-05-04 DIAGNOSIS — R300 Dysuria: Secondary | ICD-10-CM | POA: Insufficient documentation

## 2012-05-04 LAB — URINALYSIS, ROUTINE W REFLEX MICROSCOPIC
Bilirubin Urine: NEGATIVE
Glucose, UA: NEGATIVE mg/dL
Ketones, ur: NEGATIVE mg/dL
Protein, ur: NEGATIVE mg/dL
pH: 6 (ref 5.0–8.0)

## 2012-05-04 LAB — URINE MICROSCOPIC-ADD ON

## 2012-05-04 MED ORDER — NITROFURANTOIN MONOHYD MACRO 100 MG PO CAPS: 100.0000 mg | ORAL_CAPSULE | Freq: Two times a day (BID) | ORAL | Status: AC

## 2012-05-04 MED ORDER — NITROFURANTOIN MONOHYD MACRO 100 MG PO CAPS
100.0000 mg | ORAL_CAPSULE | Freq: Once | ORAL | Status: AC
  Administered 2012-05-04: 100 mg via ORAL
  Filled 2012-05-04: qty 1

## 2012-05-04 NOTE — MAU Note (Signed)
Pt states, " I started having sharp menstrual cramp type pain that started Friday morning, and I am peeing just a little bit at a time. Most of the time when I feel the pain, I have to pee."

## 2012-05-04 NOTE — MAU Provider Note (Signed)
History     CSN: [id number]  Arrival date and time: 05/04/12 0251   First Provider Initiated Contact with Patient 05/04/12 0426      Chief Complaint  Patient presents with  . Postpartum Complications   HPI  Pt is 1 week postpartum after vaginal delivery of 31 week viable infant.  Pt started having pain with urination yesterday morning.  Pt states she also had lower abdominal suprapubic pain.  She states she voids small amounts and also has lower back pain.  Pt feels like she did when she had a UTI previously.  Pt is having small amount of vaginal bleeding.  Pt is breast feeding. Pt took a Percocet earlier without relief of pain.  Past Medical History  Diagnosis Date  . Genital herpes     last outbreak 12/2011  . Hypertension     Past Surgical History  Procedure Date  . Cesarean section   .  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 Hypertension Mother   . Anesthesia problems Neg Hx   . Other Neg Hx     History  Substance Use Topics  . Smoking status: Never Smoker   . Smokeless tobacco: Not on file  . Alcohol Use: No    Allergies: No Known Allergies  Prescriptions prior to admission  Medication Sig Dispense Refill  . acyclovir (ZOVIRAX) 400 MG tablet Take 400 mg by mouth daily.      . Cholecalciferol (VITAMIN D) 400 UNITS capsule Take 400 Units by mouth daily.      Marland Kitchen ibuprofen (ADVIL,MOTRIN) 600 MG tablet Take 1 tablet (600 mg total) by mouth every 6 (six) hours.  30 tablet  5  . ibuprofen (MOTRIN IB) 600 MG tablet Take 1 tablet (600 mg total) by mouth every 6 (six) hours as needed for pain or headache. Do not use after [redacted] weeks gestation.  30 tablet  1  . oxyCODONE-acetaminophen (PERCOCET) 5-325 MG per tablet Take 1-2 tablets by mouth every 4 (four) hours as needed (moderate - severe pain).  40 tablet  0  . Prenatal Vit-Fe Fumarate-FA (PRENATAL MULTIVITAMIN) TABS Take 1 tablet by mouth every morning.         Review of  Systems  Constitutional: Negative for fever and chills.  Gastrointestinal: Positive for abdominal pain. Negative for nausea, vomiting, diarrhea and constipation.  Genitourinary: Positive for dysuria, urgency and frequency.  Neurological: Negative for headaches.   Physical Exam   Blood pressure 134/84, pulse 84, temperature 97.1 F (36.2 C), temperature source Oral, resp. rate 18, last menstrual period 09/06/2011, currently breastfeeding.  Physical Exam  Nursing note and vitals reviewed. Constitutional: She appears well-developed and well-nourished.  HENT:  Head: Normocephalic.  Eyes: Pupils are equal, round, and reactive to light.  Neck: Normal range of motion.  Cardiovascular: Normal rate.   GI: Soft. There is tenderness.       Suprapubic tenderness; no CVA tenderness  Musculoskeletal: Normal range of motion.  Neurological: She is alert.  Skin: Skin is warm and dry.  Psychiatric: She has a normal mood and affect.    MAU Course  Procedures Results for orders placed during the hospital encounter of 05/04/12 (from the past 24 hour(s))  URINALYSIS, ROUTINE W REFLEX MICROSCOPIC     Status: Abnormal   Collection Time   05/04/12  3:01 AM      Component Value Range   Color, Urine YELLOW  YELLOW  APPearance CLEAR  CLEAR   Specific Gravity, Urine 1.020  1.005 - 1.030   pH 6.0  5.0 - 8.0   Glucose, UA NEGATIVE  NEGATIVE mg/dL   Hgb urine dipstick LARGE (*) NEGATIVE   Bilirubin Urine NEGATIVE  NEGATIVE   Ketones, ur NEGATIVE  NEGATIVE mg/dL   Protein, ur NEGATIVE  NEGATIVE mg/dL   Urobilinogen, UA 0.2  0.0 - 1.0 mg/dL   Nitrite NEGATIVE  NEGATIVE   Leukocytes, UA SMALL (*) NEGATIVE  URINE MICROSCOPIC-ADD ON     Status: Abnormal   Collection Time   05/04/12  3:01 AM      Component Value Range   Squamous Epithelial / LPF FEW (*) RARE   WBC, UA 3-6  <3 WBC/hpf   RBC / HPF 11-20  <3 RBC/hpf   Bacteria, UA FEW (*) RARE   Will treat for UTI with Macrobid BID for 7 days  Urine  for C&S- pending Pt to follow up if increase in pain or fever/chills   Assessment and Plan  PP UTI- Macrobid BID for 7 days  Lailie Smead 05/04/2012, 4:27 AM

## 2012-05-06 LAB — URINE CULTURE

## 2012-05-08 ENCOUNTER — Ambulatory Visit (HOSPITAL_COMMUNITY): Payer: BC Managed Care – PPO

## 2012-09-17 ENCOUNTER — Encounter: Payer: Self-pay | Admitting: Obstetrics and Gynecology

## 2012-09-17 ENCOUNTER — Ambulatory Visit (INDEPENDENT_AMBULATORY_CARE_PROVIDER_SITE_OTHER): Payer: BC Managed Care – PPO | Admitting: Obstetrics and Gynecology

## 2012-09-17 VITALS — BP 120/80 | Ht 68.0 in | Wt 242.0 lb

## 2012-09-17 DIAGNOSIS — Z3009 Encounter for other general counseling and advice on contraception: Secondary | ICD-10-CM

## 2012-09-17 NOTE — Progress Notes (Addendum)
Pt here for sterilization procedure.  Questions about essure answered.  Filed Vitals:   09/17/12 1425  BP: 120/80   Declined exam  PSH c/s x1 and nsvd x2 PMH denies  A/P Reviewed options and r/b/a of each Pt wants Lap BTL after discussion Will schedule I need to do pelvic exam the day pt comes in for preop appt with EP

## 2012-09-24 ENCOUNTER — Encounter: Payer: BC Managed Care – PPO | Admitting: Obstetrics and Gynecology

## 2012-09-30 ENCOUNTER — Telehealth: Payer: Self-pay | Admitting: Obstetrics and Gynecology

## 2012-11-18 ENCOUNTER — Telehealth: Payer: Self-pay | Admitting: Obstetrics and Gynecology

## 2012-11-18 NOTE — Telephone Encounter (Signed)
Lap BTL scheduled for 2/26/@ 10:00 with [facility].  [employer] effective 10/23/12.  Plan pays 60/40 after a $2,000 deductible. Pre-op due $202.76. -Adrianne Pridgen

## 2012-11-26 ENCOUNTER — Other Ambulatory Visit: Payer: Self-pay | Admitting: Obstetrics and Gynecology

## 2012-12-09 ENCOUNTER — Telehealth: Payer: Self-pay | Admitting: Obstetrics and Gynecology

## 2012-12-09 NOTE — Telephone Encounter (Signed)
TC from patient. She wants to cancel her Lap Tubal because her fiance doesn't want her to tie her tubes yet. -Deanna Sparks

## 2012-12-13 ENCOUNTER — Encounter: Payer: BC Managed Care – PPO | Admitting: Obstetrics and Gynecology

## 2012-12-18 ENCOUNTER — Encounter (HOSPITAL_COMMUNITY): Admission: RE | Payer: Self-pay | Source: Ambulatory Visit

## 2012-12-18 ENCOUNTER — Ambulatory Visit (HOSPITAL_COMMUNITY)
Admission: RE | Admit: 2012-12-18 | Payer: BC Managed Care – PPO | Source: Ambulatory Visit | Admitting: Obstetrics and Gynecology

## 2012-12-18 SURGERY — LIGATION, FALLOPIAN TUBE, LAPAROSCOPIC
Anesthesia: General | Laterality: Bilateral

## 2013-01-06 ENCOUNTER — Encounter: Payer: BC Managed Care – PPO | Admitting: Obstetrics and Gynecology

## 2014-01-22 ENCOUNTER — Ambulatory Visit: Payer: Self-pay | Admitting: Obstetrics

## 2014-02-07 ENCOUNTER — Encounter (HOSPITAL_COMMUNITY): Payer: Self-pay | Admitting: Emergency Medicine

## 2014-02-07 DIAGNOSIS — R112 Nausea with vomiting, unspecified: Secondary | ICD-10-CM | POA: Insufficient documentation

## 2014-02-07 DIAGNOSIS — I10 Essential (primary) hypertension: Secondary | ICD-10-CM | POA: Insufficient documentation

## 2014-02-07 DIAGNOSIS — Z79899 Other long term (current) drug therapy: Secondary | ICD-10-CM | POA: Insufficient documentation

## 2014-02-07 DIAGNOSIS — Z9889 Other specified postprocedural states: Secondary | ICD-10-CM | POA: Insufficient documentation

## 2014-02-07 DIAGNOSIS — Z3202 Encounter for pregnancy test, result negative: Secondary | ICD-10-CM | POA: Insufficient documentation

## 2014-02-07 DIAGNOSIS — R109 Unspecified abdominal pain: Secondary | ICD-10-CM | POA: Insufficient documentation

## 2014-02-07 DIAGNOSIS — Z8619 Personal history of other infectious and parasitic diseases: Secondary | ICD-10-CM | POA: Insufficient documentation

## 2014-02-07 LAB — CBC WITH DIFFERENTIAL/PLATELET
BASOS PCT: 0 % (ref 0–1)
Basophils Absolute: 0 10*3/uL (ref 0.0–0.1)
Eosinophils Absolute: 0.3 10*3/uL (ref 0.0–0.7)
Eosinophils Relative: 2 % (ref 0–5)
HCT: 40.9 % (ref 36.0–46.0)
HEMOGLOBIN: 14 g/dL (ref 12.0–15.0)
LYMPHS ABS: 3.4 10*3/uL (ref 0.7–4.0)
Lymphocytes Relative: 24 % (ref 12–46)
MCH: 29.9 pg (ref 26.0–34.0)
MCHC: 34.2 g/dL (ref 30.0–36.0)
MCV: 87.4 fL (ref 78.0–100.0)
MONOS PCT: 5 % (ref 3–12)
Monocytes Absolute: 0.8 10*3/uL (ref 0.1–1.0)
NEUTROS ABS: 9.8 10*3/uL — AB (ref 1.7–7.7)
NEUTROS PCT: 69 % (ref 43–77)
PLATELETS: 349 10*3/uL (ref 150–400)
RBC: 4.68 MIL/uL (ref 3.87–5.11)
RDW: 13.8 % (ref 11.5–15.5)
WBC: 14.3 10*3/uL — ABNORMAL HIGH (ref 4.0–10.5)

## 2014-02-07 LAB — I-STAT CHEM 8, ED
BUN: 10 mg/dL (ref 6–23)
CHLORIDE: 102 meq/L (ref 96–112)
Calcium, Ion: 1.17 mmol/L (ref 1.12–1.23)
Creatinine, Ser: 0.9 mg/dL (ref 0.50–1.10)
GLUCOSE: 109 mg/dL — AB (ref 70–99)
HEMATOCRIT: 44 % (ref 36.0–46.0)
Hemoglobin: 15 g/dL (ref 12.0–15.0)
POTASSIUM: 3.3 meq/L — AB (ref 3.7–5.3)
SODIUM: 142 meq/L (ref 137–147)
TCO2: 28 mmol/L (ref 0–100)

## 2014-02-07 LAB — URINE MICROSCOPIC-ADD ON

## 2014-02-07 LAB — URINALYSIS, ROUTINE W REFLEX MICROSCOPIC
Bilirubin Urine: NEGATIVE
Glucose, UA: NEGATIVE mg/dL
Ketones, ur: NEGATIVE mg/dL
NITRITE: NEGATIVE
PROTEIN: NEGATIVE mg/dL
SPECIFIC GRAVITY, URINE: 1.02 (ref 1.005–1.030)
UROBILINOGEN UA: 0.2 mg/dL (ref 0.0–1.0)
pH: 8 (ref 5.0–8.0)

## 2014-02-07 LAB — POC URINE PREG, ED: PREG TEST UR: NEGATIVE

## 2014-02-07 NOTE — ED Notes (Signed)
Patient with nausea, vomiting and flank pain radiating into abdomen.  This started two hours ago.

## 2014-02-08 ENCOUNTER — Emergency Department (HOSPITAL_COMMUNITY)
Admission: EM | Admit: 2014-02-08 | Discharge: 2014-02-08 | Disposition: A | Discharge status: Home / Self Care | Payer: BC Managed Care – PPO | Attending: Emergency Medicine | Admitting: Emergency Medicine

## 2014-02-08 ENCOUNTER — Emergency Department (HOSPITAL_COMMUNITY): Payer: BC Managed Care – PPO

## 2014-02-08 ENCOUNTER — Emergency Department (HOSPITAL_COMMUNITY)
Admission: EM | Admit: 2014-02-08 | Discharge: 2014-02-08 | Discharge status: Against Medical Advice | Payer: BC Managed Care – PPO

## 2014-02-08 DIAGNOSIS — R112 Nausea with vomiting, unspecified: Secondary | ICD-10-CM

## 2014-02-08 DIAGNOSIS — R109 Unspecified abdominal pain: Secondary | ICD-10-CM

## 2014-02-08 MED ORDER — POTASSIUM CHLORIDE CRYS ER 20 MEQ PO TBCR
40.0000 meq | EXTENDED_RELEASE_TABLET | Freq: Once | ORAL | Status: AC
  Administered 2014-02-08: 40 meq via ORAL
  Filled 2014-02-08: qty 2

## 2014-02-08 MED ORDER — ONDANSETRON HCL 4 MG PO TABS: 4.0000 mg | ORAL_TABLET | Freq: Four times a day (QID) | ORAL | Status: DC

## 2014-02-08 MED ORDER — FENTANYL CITRATE 0.05 MG/ML IJ SOLN
50.0000 ug | INTRAMUSCULAR | Status: DC | PRN
  Administered 2014-02-08: 50 ug via INTRAVENOUS
  Filled 2014-02-08: qty 2

## 2014-02-08 MED ORDER — DEXTROSE 5 % IV SOLN
1.0000 g | Freq: Once | INTRAVENOUS | Status: AC
  Administered 2014-02-08: 1 g via INTRAVENOUS
  Filled 2014-02-08: qty 10

## 2014-02-08 MED ORDER — ONDANSETRON HCL 4 MG/2ML IJ SOLN
4.0000 mg | Freq: Once | INTRAMUSCULAR | Status: AC
  Administered 2014-02-08: 4 mg via INTRAVENOUS
  Filled 2014-02-08: qty 2

## 2014-02-08 MED ORDER — CEPHALEXIN 500 MG PO CAPS: 500.0000 mg | ORAL_CAPSULE | Freq: Four times a day (QID) | ORAL | Status: DC

## 2014-02-08 MED ORDER — HYDROCODONE-ACETAMINOPHEN 5-325 MG PO TABS: 2.0000 | ORAL_TABLET | ORAL | Status: DC | PRN

## 2014-02-08 MED ORDER — SODIUM CHLORIDE 0.9 % IV BOLUS (SEPSIS)
1000.0000 mL | Freq: Once | INTRAVENOUS | Status: AC
  Administered 2014-02-08: 1000 mL via INTRAVENOUS

## 2014-02-08 NOTE — Discharge Instructions (Signed)
Flank Pain  Rest, drink plenty of fluids and take antibiotics as prescribed. Followup with her primary care physician for recheck in 2 days . Return here for any worsening condition despite medications. \ Flank pain refers to pain that is located on the side of the body between the upper abdomen and the back. The pain may occur over a short period of time (acute) or may be long-term or reoccurring (chronic). It may be mild or severe. Flank pain can be caused by many things.  CAUSES  Some of the more common causes of flank pain include:  Muscle strains.  Muscle spasms.  A disease of your spine (vertebral disk disease).  A lung infection (pneumonia).  Fluid around your lungs (pulmonary edema).  A kidney infection.  Kidney stones.  A very painful skin rash caused by the chickenpox virus (shingles).  Gallbladder disease.  [place] care will depend on the cause of your pain. In general,  Rest as directed by your caregiver.  Drink enough fluids to keep your urine clear or pale yellow.  Only take over-the-counter or prescription medicines as directed by your caregiver. Some medicines may help relieve the pain.  Tell your caregiver about any changes in your pain.  Follow up with your caregiver as directed. SEEK IMMEDIATE MEDICAL CARE IF:  Your pain is not controlled with medicine.  You have new or worsening symptoms.  Your pain increases.  You have abdominal pain.  You have shortness of breath.  You have persistent nausea or vomiting.  You have swelling in your abdomen.  You feel faint or pass out.  You have blood in your urine.  You have a fever or persistent symptoms for more than 2 3 days.  You have a fever and your symptoms suddenly get worse. MAKE SURE YOU:  Understand these instructions.  Will watch your condition.  Will get help right away if you are not doing well or get worse. Document Released: 11/30/2005 Document Revised: 07/03/2012 Document Reviewed:  05/23/2012  [facility] Patient Information 2014 [place].

## 2014-02-08 NOTE — ED Provider Notes (Signed)
CSN: [id number]     Arrival date & time 02/07/14  2235 History   First MD Initiated Contact with Patient 02/08/14 [phone]     Chief Complaint  Patient presents with  . Abdominal Pain  . Nausea  . Emesis     (Consider location/radiation/quality/duration/timing/severity/associated sxs/prior Treatment) HPI History provided by patient. Right-sided abdominal pain/right flank pain onset today with associated nausea vomiting. No fevers or chills. No hematuria. No dysuria, urgency or frequency. No history of kidney stones or similar symptoms in the past. Pain described as cramping at times and is sharp in quality and moderate in severity. No known alleviating factors. Past Medical History  Diagnosis Date  . Genital herpes     last outbreak 12/2011  . Hypertension    Past Surgical History  Procedure Laterality Date  . Cesarean section    .  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 Hypertension Mother   . Anesthesia problems Neg Hx   . Other Neg Hx    History  Substance Use Topics  . Smoking status: Never Smoker   . Smokeless tobacco: Not on file  . Alcohol Use: No   OB History   Grav Para Term Preterm Abortions TAB SAB Ect Mult Living   4 3 2 1  0 0 0 0 0 3     Review of Systems  Constitutional: Negative for fever and chills.  Respiratory: Negative for shortness of breath.   Cardiovascular: Negative for chest pain.  Gastrointestinal: Positive for vomiting and abdominal pain.  Genitourinary: Positive for flank pain. Negative for dysuria and hematuria.  Musculoskeletal: Negative for back pain, neck pain and neck stiffness.  Skin: Negative for rash.  Neurological: Negative for headaches.  All other systems reviewed and are negative.     Allergies  Codeine and Ultram  Home Medications   Prior to Admission medications   Medication Sig Start Date End Date Taking? Authorizing Provider  acyclovir (ZOVIRAX) 400 MG tablet Take 400 mg  by mouth daily.   Yes Historical Provider, MD  Cholecalciferol (VITAMIN D) 400 UNITS capsule Take 400 Units by mouth daily.   Yes Historical Provider, MD  Ronnette Juniper FE 1/20 1-20 MG-MCG tablet Take 1 tablet by mouth daily.  02/04/14  Yes Historical Provider, MD  lisinopril-hydrochlorothiazide (PRINZIDE,ZESTORETIC) 20-12.5 MG per tablet Take 1 tablet by mouth daily.  01/06/14  Yes Historical Provider, MD  ibuprofen (ADVIL,MOTRIN) 600 MG tablet Take 1 tablet (600 mg total) by mouth every 6 (six) hours. 05/01/12 05/11/12  Shelly Bombard, MD  ibuprofen (MOTRIN IB) 600 MG tablet Take 1 tablet (600 mg total) by mouth every 6 (six) hours as needed for pain or headache. Do not use after [redacted] weeks gestation. 11/19/11 11/29/11  Manya Silvas, CNM   BP 108/67  Pulse 87  Temp(Src) 98.2 F (36.8 C) (Oral)  Resp 22  SpO2 99%  LMP 02/04/2014  Breastfeeding? No Physical Exam  Constitutional: She is oriented to person, place, and time. She appears well-developed and well-nourished.  HENT:  Head: Normocephalic and atraumatic.  Eyes: EOM are normal. Pupils are equal, round, and reactive to light. No scleral icterus.  Neck: Neck supple.  Cardiovascular: Normal rate, regular rhythm and intact distal pulses.   Pulmonary/Chest: Effort normal and breath sounds normal. No respiratory distress.  Abdominal: Soft. Bowel sounds are normal. She exhibits no distension and no mass. There is no rebound and no guarding.  Tender  right flank. Negative Murphy sign without right upper quadrant tenderness. No CVA tenderness.  Musculoskeletal: Normal range of motion. She exhibits no edema.  Neurological: She is alert and oriented to person, place, and time.  Skin: Skin is warm and dry.    ED Course  Procedures (including critical care time) Labs Review Labs Reviewed  CBC WITH DIFFERENTIAL - Abnormal; Notable for the following:    WBC 14.3 (*)    Neutro Abs 9.8 (*)    All other components within normal limits  URINALYSIS,  ROUTINE W REFLEX MICROSCOPIC - Abnormal; Notable for the following:    APPearance TURBID (*)    Hgb urine dipstick LARGE (*)    Leukocytes, UA SMALL (*)    All other components within normal limits  URINE MICROSCOPIC-ADD ON - Abnormal; Notable for the following:    Squamous Epithelial / LPF FEW (*)    Bacteria, UA FEW (*)    All other components within normal limits  I-STAT CHEM 8, ED - Abnormal; Notable for the following:    Potassium 3.3 (*)    Glucose, Bld 109 (*)    All other components within normal limits  POC URINE PREG, ED    Imaging Review Ct Abdomen Pelvis Wo Contrast  02/08/2014   CLINICAL DATA:  Right flank pain and right lower quadrant pain. Nausea and vomiting.  EXAM: CT ABDOMEN AND PELVIS WITHOUT CONTRAST  TECHNIQUE: Multidetector CT imaging of the abdomen and pelvis was performed following the standard protocol without intravenous contrast.  COMPARISON:  08/10/2010  FINDINGS: Focal scarring in the anterior right lung base.  No renal, ureteral, or bladder stones identified. No pyelocaliectasis or ureterectasis. The right kidney demonstrates a somewhat hazy pattern with loss of distinction of the margins and with mild increase in size with respect to the left. This could represent sequela of a recently passed stone or a could indicate pyelonephritis. No bladder wall thickening.  The unenhanced appearance of the liver, spleen, gallbladder, pancreas, adrenal glands, abdominal aorta, inferior vena cava, and retroperitoneal lymph nodes is unremarkable. The stomach, small bowel, and colon are not abnormally distended. No free air or free fluid in the abdomen. Small umbilical hernia containing fat.  Pelvis: Appendix is normal. No free or loculated pelvic fluid collections. Uterus and ovaries are not enlarged. Scattered diverticula in the sigmoid colon without diverticulitis. No destructive bone lesions.  IMPRESSION: No renal or ureteral stone or obstruction. Hazy appearance of the right  kidney could represent sequela of recently passed stone or changes due to pyelonephritis.   Electronically Signed   By: Lucienne Capers M.D.   On: 02/08/2014 03:06   IV fluids. IV fentanyl. IV Zofran. IV Rocephin. Potassium provided.  6:27 AM nausea improved and is tolerating by mouth fluids. On recheck is feeling much better. CT results shared with patient as above. Plan discharge with prescription for pain medication, antibiotics and antiemetics. Patient agrees to outpatient followup instructed her precautions for any worsening condition.   MDM   Diagnosis: Flank pain, vomiting  And evaluated with labs and urinalysis reviewed as above. CT scan shows possible pyelonephritis or sequela of recently passed stone. Symptomatically improved with IV fluids, IV Zofran and IV narcotics. Patient was given antibiotics. Urine culture pending. Vital signs and nursing notes reviewed and considered.    Teressa Lower, MD 02/08/14 [phone]

## 2014-02-09 LAB — URINE CULTURE: Colony Count: >=100000

## 2014-02-11 ENCOUNTER — Telehealth (HOSPITAL_BASED_OUTPATIENT_CLINIC_OR_DEPARTMENT_OTHER): Payer: Self-pay | Admitting: Emergency Medicine

## 2014-02-11 NOTE — Telephone Encounter (Signed)
Post ED Visit - Positive Culture Follow-up  Culture report reviewed by antimicrobial stewardship pharmacist: []  Wes [place], Pharm.D., BCPS []  Heide Guile, Pharm.D., BCPS []  Alycia Rossetti, Pharm.D., BCPS []  Bryan, Pharm.D., BCPS, AAHIVP []  Legrand Como, Pharm.D., BCPS, AAHIVP [x]  Juliene Pina, Pharm.D.  Positive urine culture Treated with Keflex, organism sensitive to the same and no further patient follow-up is required at this time.  Deanna Sparks 02/11/2014, 11:26 AM

## 2014-02-20 ENCOUNTER — Ambulatory Visit (INDEPENDENT_AMBULATORY_CARE_PROVIDER_SITE_OTHER): Payer: BC Managed Care – PPO

## 2014-02-20 ENCOUNTER — Encounter: Payer: Self-pay | Admitting: Podiatrist

## 2014-02-20 ENCOUNTER — Ambulatory Visit (INDEPENDENT_AMBULATORY_CARE_PROVIDER_SITE_OTHER): Payer: BC Managed Care – PPO | Admitting: Podiatrist

## 2014-02-20 VITALS — BP 129/99 | HR 92 | Resp 18

## 2014-02-20 DIAGNOSIS — M722 Plantar fascial fibromatosis: Secondary | ICD-10-CM

## 2014-02-20 MED ORDER — TRIAMCINOLONE ACETONIDE 40 MG/ML IJ SUSP
20.0000 mg | Freq: Once | INTRAMUSCULAR | Status: AC
  Administered 2014-02-20: 20 mg

## 2014-02-20 NOTE — Patient Instructions (Signed)
Plantar Fasciitis (Heel Spur Syndrome) with Rehab The plantar fascia is a fibrous, ligament-like, soft-tissue structure that spans the bottom of the foot. Plantar fasciitis is a condition that causes pain in the foot due to inflammation of the tissue. SYMPTOMS   Pain and tenderness on the underneath side of the foot.  Pain that worsens with standing or walking. CAUSES  Plantar fasciitis is caused by irritation and injury to the plantar fascia on the underneath side of the foot. Common mechanisms of injury include:  Direct trauma to bottom of the foot.  Damage to a small nerve that runs under the foot where the main fascia attaches to the heel bone. Stress placed on the plantar fascia due to any mild increased activity or injury RISK INCREASES WITH:   Obesity.  Poor strength and flexibility.  Improperly fitted shoes.  Tight calf muscles.  Flat feet.  Failure to warm-up properly before activity.  PREVENTION  Warm up and stretch properly before activity.  Strength, flexibility  Maintain a health body weight.  Avoid stress on the plantar fascia.  Wear properly fitted shoes, including arch supports for individuals who have flat feet. PROGNOSIS  If treated properly, then the symptoms of plantar fasciitis usually resolve without surgery. However, occasionally surgery is necessary. RELATED COMPLICATIONS   Recurrent symptoms that may result in a chronic condition.  Problems of the lower back that are caused by compensating for the injury, such as limping.  Pain or weakness of the foot during push-off following surgery.  Chronic inflammation, scarring, and partial or complete fascia tear, occurring more often from repeated injections. TREATMENT  Treatment initially involves the use of ice and medication to help reduce pain and inflammation. The use of strengthening and stretching exercises may help reduce pain with activity, especially stretches of the Achilles tendon.  Your  caregiver may recommend that you use arch supports to help reduce stress on the plantar fascia. Often, corticosteroid injections are given to reduce inflammation. If symptoms persist for greater than 6 months despite non-surgical (conservative), then surgery may be recommended.  MEDICATION   If pain medication is necessary, then nonsteroidal anti-inflammatory medications, such as aspirin and ibuprofen, or other minor pain relievers, such as acetaminophen, are often recommended. Corticosteroid injections may be given by your caregiver.  HEAT AND COLD  Cold treatment (icing) relieves pain and reduces inflammation. Cold treatment should be applied for 10 to 15 minutes every 2 to 3 hours for inflammation and pain and immediately after any activity that aggravates your symptoms. Use ice packs or massage the area with a piece of ice (ice massage).  Heat treatment may be used prior to performing the stretching and strengthening activities prescribed by your caregiver, physical therapist, or athletic trainer. Use a heat pack or soak the injury in warm water. SEEK IMMEDIATE MEDICAL CARE IF:  Treatment seems to offer no benefit, or the condition worsens.  Any medications produce adverse side effects.    EXERCISES-- perform each exercise a total of 10-15 repetitions.  Hold for 30 seconds and perform 3 times per day   RANGE OF MOTION (ROM) AND STRETCHING EXERCISES - Plantar Fasciitis (Heel Spur Syndrome) These exercises may help you when beginning to rehabilitate your injury.   While completing these exercises, remember:   Restoring tissue flexibility helps normal motion to return to the joints. This allows healthier, less painful movement and activity.  An effective stretch should be held for at least 30 seconds.  A stretch should never be painful. You  should only feel a gentle lengthening or release in the stretched tissue. RANGE OF MOTION - Toe Extension, Flexion  Sit with your right / left  leg crossed over your opposite knee.  Grasp your toes and gently pull them back toward the top of your foot. You should feel a stretch on the bottom of your toes and/or foot.  Hold this stretch for __________ seconds.  Now, gently pull your toes toward the bottom of your foot. You should feel a stretch on the top of your toes and or foot.  Hold this stretch for __________ seconds. Repeat __________ times. Complete this stretch __________ times per day.  RANGE OF MOTION - Ankle Dorsiflexion, Active Assisted  Remove shoes and sit on a chair that is preferably not on a carpeted surface.  Place right / left foot under knee. Extend your opposite leg for support.  Keeping your heel down, slide your right / left foot back toward the chair until you feel a stretch at your ankle or calf. If you do not feel a stretch, slide your bottom forward to the edge of the chair, while still keeping your heel down.  Hold this stretch for __________ seconds. Repeat __________ times. Complete this stretch __________ times per day.  STRETCH  Gastroc, Standing  Place hands on wall.  Extend right / left leg, keeping the front knee somewhat bent.  Slightly point your toes inward on your back foot.  Keeping your right / left heel on the floor and your knee straight, shift your weight toward the wall, not allowing your back to arch.  You should feel a gentle stretch in the right / left calf. Hold this position for __________ seconds. Repeat __________ times. Complete this stretch __________ times per day. STRETCH  Soleus, Standing  Place hands on wall.  Extend right / left leg, keeping the other knee somewhat bent.  Slightly point your toes inward on your back foot.  Keep your right / left heel on the floor, bend your back knee, and slightly shift your weight over the back leg so that you feel a gentle stretch deep in your back calf.  Hold this position for __________ seconds. Repeat __________ times.  Complete this stretch __________ times per day. STRETCH  Gastrocsoleus, Standing  Note: This exercise can place a lot of stress on your foot and ankle. Please complete this exercise only if specifically instructed by your caregiver.   Place the ball of your right / left foot on a step, keeping your other foot firmly on the same step.  Hold on to the wall or a rail for balance.  Slowly lift your other foot, allowing your body weight to press your heel down over the edge of the step.  You should feel a stretch in your right / left calf.  Hold this position for __________ seconds.  Repeat this exercise with a slight bend in your right / left knee. Repeat __________ times. Complete this stretch __________ times per day.  STRENGTHENING EXERCISES - Plantar Fasciitis (Heel Spur Syndrome)  These exercises may help you when beginning to rehabilitate your injury. They may resolve your symptoms with or without further involvement from your physician, physical therapist or athletic trainer. While completing these exercises, remember:   Muscles can gain both the endurance and the strength needed for everyday activities through controlled exercises.  Complete these exercises as instructed by your physician, physical therapist or athletic trainer. Progress the resistance and repetitions only as guided.  Endoscopic Plantar Fasciotomy On the underside of the foot and heel is a tight band of tissue called the plantar fascia. Sometimes the plantar fascia become inflamed (the body's way of reacting to injury, overuse or infection) which produces pain. The condition is known as plantar fasciitis.  One way to treat plantar fasciitis is through an endoscopic plantar fasciotomy. This is surgery to reduce the tension on the plantar fascia. However, it is a minimally invasive surgery because there will be no large incision. Instead, the surgeon inserts a thin, flexible tube through a small (1/16th of an inch  (1.59 mm)) cut in your skin. The surgeon can examine and release the fascia through this tube. Recovery from an endoscopic fasciotomy is usually less painful and faster than from open surgery. LET YOUR CAREGIVER KNOW ABOUT:  Any allergies.  All medications you are taking, including:  Herbs, eyedrops, over-the-counter medications and creams.  Blood thinners (anticoagulants) or other drugs that could affect blood clotting.  Use of steroids (by mouth or as creams).  Previous problems with anesthetics, including local anesthetics.  Possibility of pregnancy, if this applies.  Any history of blood clots.  Any history of bleeding or other blood problems.  Previous surgery.  Smoking history.  Other health problems.  Family history of anesthetic problems RISKS AND COMPLICATIONS  Short-term possibilities include:  Excessive bleeding.  Pain.  Loss of feeling (numbness) at the site of the incision.  Hematoma, a pooling of blood in the wound.  Infection.  Slow resolution of the symptoms. Longer-term possibilities include:  Scarring.  A return of the condition that led to fasciotomy.  Damage to nerves in the area.  Weakness in your foot.  Need for additional surgery. BEFORE THE PROCEDURE  Ask whether you need to get shoes that will support your heel and arch while you recover.  7 to 10 days before the surgery, stop using aspirin and non-steroidal anti-inflammatory drugs (NSAIDs) for pain relief. This includes prescription drugs and over-the-counter drugs such as ibuprofen and naproxen.  If you take blood-thinners, ask your healthcare provider when you should stop taking them.  Do not eat or drink for about 8 hours before your surgery.  You might be asked to shower or wash with a special antibacterial soap before the procedure.  Arrive 1-2 hours before the surgery, or whenever your surgeon recommends. This will give you time to check in and fill out any needed  paperwork.  If your surgery is an outpatient procedure, you will be able to go home the same day. Make arrangements in advance for someone to drive you home. PROCEDURE  You may be given general anesthesia (you will be asleep), regional anesthesia (your leg will be numbed) or local anesthesia (just the area around the fascia will be numbed). With regional and local anesthesia you will be given medication to make you groggy but awake during the procedure.  Your foot will be cleaned and sterilized.  The surgeon will make a cut (incision) on the side of your heel. Then a thin tube that contains a tiny camera will be inserted into the space. The camera makes it possible for the surgeon to see what is happening inside your foot.  The surgeon will work through this tube to release the fascia.  The tube will be removed, and dressing will be applied to the incisions. AFTER THE PROCEDURE After the procedure, you will be taken to another room to recover. People usually go home the same day. Before leaving, make  sure you have detailed instructions on how to care for the incision. Also, you may be given crutches and shown how to use them. Ask your surgeon whether physical therapy will be needed.  HOME CARE INSTRUCTIONS   Take any prescription medication for pain and/or nausea that your surgeon prescribes. Follow the directions carefully and take all of the medication.  Ask your surgeon whether you can take over-the-counter medicines for pain, discomfort or fever. Do not take aspirin unless your healthcare provider says to. Aspirin can increase the chances of bleeding.  You may need to put ice on your foot for 10 to 15 minutes each day for several days.  While you are resting, keep your foot elevated above the level of your heart.  Do not get the incisions wet for the first few days after surgery (or until the surgeon tells you it is OK).  Avoid standing or walking for long periods. Your healthcare  provider will tell you when you are clear to resume normal activity. If your job requires a lot of standing or walking, ask to be assigned to a less active position for about 8 weeks.  When you are up and about, wear shoes with a supportive heel and arch support. Soft running shoes may be recommended for the first two weeks of recovery. SEEK MEDICAL CARE IF:   The wound becomes red or swollen.  The wound leaks fluid or blood.  Your pain increases.  You become nauseous or vomit for more than two days after the surgery.  You have pain or difficulty moving your foot.  You develop a fever of more than 100.5 F (38.1 C). SEEK IMMEDIATE MEDICAL CARE IF:   Your leg or foot starts to swell.  You develop a fever of 102.0 F (38.9 C) or higher. Document Released: 08/06/2009 Document Revised: 01/01/2012 Document Reviewed: 08/06/2009 [facility] Patient Information 2014 [place], [place].

## 2014-02-20 NOTE — Progress Notes (Signed)
   Subjective:    Patient ID: Deanna Sparks, female    DOB: 09/24/1976, 38 y.o.   MRN: [id number]  HPI my heels bother me on the bottom and have been for about 2 years and sore and tender and burns and throbs and hurts with shoes and I have had inserts and shots and a brace wrapped around the heel and the biofreeze    Review of Systems  All other systems reviewed and are negative.      Objective:   Physical Exam  GENERAL APPEARANCE: Alert, conversant. Appropriately groomed. No acute distress.  VASCULAR: Pedal pulses palpable and strong bilateral.  Capillary refill time is immediate to all digits,  Proximal to distal cooling it warm to warm.  Digital hair growth is present bilateral  NEUROLOGIC: sensation is intact epicritically and protectively to 5.07 monofilament at 5/5 sites bilateral.  Light touch is intact bilateral, vibratory sensation intact bilateral, achilles tendon reflex is intact bilateral.  Negative Tinel sign is elicited MUSCULOSKELETAL: Pain on palpation plantar medial aspect both feet  at insertion of plantar fascia on the medial calcaneal tubercle. Inflammation at the insertion of the plantar fascia is present. Rectus foot type is seen. DERMATOLOGIC: skin color, texture, and turger are within normal limits.  No preulcerative lesions are seen, no interdigital maceration noted.  No open lesions present.  Digital nails are asymptomatic.      Assessment & Plan:  Plantar fasciitis bilateral  Plan:  Injected the heels without complication.  Recommended good shoes, stretches and orthotics. Will see her back for a recheck.

## 2014-03-13 ENCOUNTER — Ambulatory Visit: Payer: BC Managed Care – PPO | Admitting: Podiatrist

## 2014-04-03 ENCOUNTER — Ambulatory Visit: Payer: BC Managed Care – PPO | Admitting: Podiatrist

## 2014-08-24 ENCOUNTER — Encounter: Payer: Self-pay | Admitting: Podiatrist

## 2014-11-06 ENCOUNTER — Ambulatory Visit: Payer: BC Managed Care – PPO | Admitting: Podiatrist

## 2014-11-25 ENCOUNTER — Ambulatory Visit: Payer: Self-pay | Admitting: Podiatrist

## 2014-12-16 ENCOUNTER — Ambulatory Visit (INDEPENDENT_AMBULATORY_CARE_PROVIDER_SITE_OTHER): Payer: BLUE CROSS/BLUE SHIELD | Admitting: Podiatrist

## 2014-12-16 ENCOUNTER — Encounter: Payer: Self-pay | Admitting: Podiatrist

## 2014-12-16 VITALS — BP 135/90 | HR 92 | Resp 12

## 2014-12-16 DIAGNOSIS — M722 Plantar fascial fibromatosis: Secondary | ICD-10-CM

## 2014-12-16 NOTE — Patient Instructions (Signed)
Pre-Operative Instructions  Congratulations, you have decided to take an important step to improving your quality of life.  You can be assured that the doctors of [facility] will be with you every step of the way.  1. Plan to be at the surgery center/hospital at least 1 (one) hour prior to your scheduled time unless otherwise directed by the surgical center/hospital staff.  You must have a responsible adult accompany you, remain during the surgery and drive you home.  Make sure you have directions to the surgical center/hospital and know how to get there on time. 2. For hospital based surgery you will need to obtain a history and physical form from your family physician within 1 month prior to the date of surgery- we will give you a form for you primary physician.  3. We make every effort to accommodate the date you request for surgery.  There are however, times where surgery dates or times have to be moved.  We will contact you as soon as possible if a change in schedule is required.   4. No Aspirin/Ibuprofen for one week before surgery.  If you are on aspirin, any non-steroidal anti-inflammatory medications (Mobic, Aleve, Ibuprofen) you should stop taking it 7 days prior to your surgery.  You make take Tylenol  For pain prior to surgery.  5. Medications- If you are taking daily heart and blood pressure medications, seizure, reflux, allergy, asthma, anxiety, pain or diabetes medications, make sure the surgery center/hospital is aware before the day of surgery so they may notify you which medications to take or avoid the day of surgery. 6. No food or drink after midnight the night before surgery unless directed otherwise by surgical center/hospital staff. 7. No alcoholic beverages 24 hours prior to surgery.  No smoking 24 hours prior to or 24 hours after surgery. 8. Wear loose pants or shorts- loose enough to fit over bandages, boots, and casts. 9. No slip on shoes, sneakers are best. 10. Bring  your boot with you to the surgery center/hospital.  Also bring crutches or a walker if your physician has prescribed it for you.  If you do not have this equipment, it will be provided for you after surgery. 11. If you have not been contracted by the surgery center/hospital by the day before your surgery, call to confirm the date and time of your surgery. 12. Leave-time from work may vary depending on the type of surgery you have.  Appropriate arrangements should be made prior to surgery with your employer. 13. Prescriptions will be provided immediately following surgery by your doctor.  Have these filled as soon as possible after surgery and take the medication as directed. 14. Remove nail polish on the operative foot. 15. Wash the night before surgery.  The night before surgery wash the foot and leg well with the antibacterial soap provided and water paying special attention to beneath the toenails and in between the toes.  Rinse thoroughly with water and dry well with a towel.  Perform this wash unless told not to do so by your physician.  Enclosed: 1 Ice pack (please put in freezer the night before surgery)   1 Hibiclens skin cleaner   Pre-op Instructions  If you have any questions regarding the instructions, do not hesitate to call our office.  [place]: [address] [place], [place] [address] [phone]  [place]: [address] [place], [place] [address] [phone]  [place]: [address]  [place], [place] [address] [phone]  Dr. Richard   Tuchman DPM, Dr. Norman Regal DPM Dr. Richard Sikora DPM, Dr. M. Todd Hyatt DPM, Dr. Brexton Sofia DPM 

## 2014-12-17 ENCOUNTER — Telehealth: Payer: Self-pay | Admitting: *Deleted

## 2014-12-17 MED ORDER — TRIAMCINOLONE ACETONIDE 10 MG/ML IJ SUSP: 10.0000 mg | Freq: Once | INTRAMUSCULAR | Status: DC

## 2014-12-17 NOTE — Progress Notes (Signed)
Chief Complaint  Patient presents with  . Plantar Fasciitis    SURGERY CONSULT FOR B/L HEEL.     HPI: Patient is 39 y.o. female who presents today for follow up of heel pain bilateral.  She states she has had multiple shots which never last more than 30 days.  She has tried changing shoes, inserts, stretches, and states she never gets long term relief.  She would like to know about surgical options at today's visit.     Allergies  Allergen Reactions  . Codeine Itching  . Ultram [Tramadol] Itching    Physical Exam  Patient is awake, alert, and oriented x 3.  In no acute distress.  Vascular status is intact with palpable pedal pulses at 2/4 DP and PT bilateral and capillary refill time within normal limits. Neurological sensation is also intact bilaterally via Semmes Weinstein monofilament at 5/5 sites. Light touch, vibratory sensation, Achilles tendon reflex is intact. Dermatological exam reveals skin color, turger and texture as normal. No open lesions present.  Musculature intact with dorsiflexion, plantarflexion, inversion, eversion.  Pain on palpation plantar medial aspect of the bilateral heels noted with palpation.  Pain and inflammation at insertion of plantar fascia noted.    Assessment: plantar fasciitis bilateral.  Plan: Discussed conservative versus surgical options. Recommended a endoscopic release of the plantar fascia left foot to be followed by the right foot 2 weeks later. The consent form was discussed and all three pages were signed and the patient's questions were encouraged and answered to the best of my ability. Risks of the surgery were discussed including but not limited to continued pain, infection, swelling, nerve pain or numbness,  suture or implant reaction, bleeding, decreased function, etc. Preoperative instructions were also dispensed to the patient as well as a preoperative surgical pamphlet to go along with the instructions. Surgery will be scheduled at the  patients convenience and patient will be seen at [facility] specialty surgery center on outpatient basis.The patient is instructed to call if any questions or concerns arise.  A night splint was dispensed as well and she will start to wear this pre operatively.  i also injected bilateral heels today with kenalog and marcaine plain to help her get by till surgery.

## 2014-12-17 NOTE — Telephone Encounter (Signed)
I called patient and gave her an estimate for Endoscopic Plantar Fasciotomy surgery.  You have a $2500 deductible.  Once the deductible is met you will be responsible for 40%, which would be around $504.08,  If deductible is not met, it will cost $1260.22.  Keep in mind this is only the physician fee, you will also have an anesthesia and facility fee.  "Do you know how much those fees will be?"  No, I can give you the number to the surgical center.  It is [phone].  "Okay thank you." 

## 2015-03-19 ENCOUNTER — Emergency Department (HOSPITAL_COMMUNITY)
Admission: EM | Admit: 2015-03-19 | Discharge: 2015-03-20 | Disposition: A | Discharge status: Home / Self Care | Payer: BLUE CROSS/BLUE SHIELD | Attending: Emergency Medicine | Admitting: Emergency Medicine

## 2015-03-19 ENCOUNTER — Encounter (HOSPITAL_COMMUNITY): Payer: Self-pay | Admitting: *Deleted

## 2015-03-19 DIAGNOSIS — N760 Acute vaginitis: Secondary | ICD-10-CM | POA: Diagnosis not present

## 2015-03-19 DIAGNOSIS — Z8619 Personal history of other infectious and parasitic diseases: Secondary | ICD-10-CM | POA: Insufficient documentation

## 2015-03-19 DIAGNOSIS — R110 Nausea: Secondary | ICD-10-CM | POA: Insufficient documentation

## 2015-03-19 DIAGNOSIS — I10 Essential (primary) hypertension: Secondary | ICD-10-CM | POA: Diagnosis not present

## 2015-03-19 DIAGNOSIS — R102 Pelvic and perineal pain: Secondary | ICD-10-CM

## 2015-03-19 DIAGNOSIS — R1030 Lower abdominal pain, unspecified: Secondary | ICD-10-CM | POA: Diagnosis present

## 2015-03-19 DIAGNOSIS — Z79899 Other long term (current) drug therapy: Secondary | ICD-10-CM | POA: Diagnosis not present

## 2015-03-19 DIAGNOSIS — Z3202 Encounter for pregnancy test, result negative: Secondary | ICD-10-CM | POA: Diagnosis not present

## 2015-03-19 DIAGNOSIS — B9689 Other specified bacterial agents as the cause of diseases classified elsewhere: Secondary | ICD-10-CM

## 2015-03-19 LAB — CBC WITH DIFFERENTIAL/PLATELET
Basophils Absolute: 0 10*3/uL (ref 0.0–0.1)
Basophils Relative: 0 % (ref 0–1)
EOS PCT: 2 % (ref 0–5)
Eosinophils Absolute: 0.2 10*3/uL (ref 0.0–0.7)
HCT: 38.9 % (ref 36.0–46.0)
Hemoglobin: 13.3 g/dL (ref 12.0–15.0)
LYMPHS ABS: 3.9 10*3/uL (ref 0.7–4.0)
Lymphocytes Relative: 37 % (ref 12–46)
MCH: 29.6 pg (ref 26.0–34.0)
MCHC: 34.2 g/dL (ref 30.0–36.0)
MCV: 86.6 fL (ref 78.0–100.0)
MONOS PCT: 5 % (ref 3–12)
Monocytes Absolute: 0.6 10*3/uL (ref 0.1–1.0)
Neutro Abs: 5.9 10*3/uL (ref 1.7–7.7)
Neutrophils Relative %: 56 % (ref 43–77)
PLATELETS: 345 10*3/uL (ref 150–400)
RBC: 4.49 MIL/uL (ref 3.87–5.11)
RDW: 13.9 % (ref 11.5–15.5)
WBC: 10.5 10*3/uL (ref 4.0–10.5)

## 2015-03-19 LAB — COMPREHENSIVE METABOLIC PANEL
ALBUMIN: 3.4 g/dL — AB (ref 3.5–5.0)
ALT: 17 U/L (ref 14–54)
ANION GAP: 9 (ref 5–15)
AST: 23 U/L (ref 15–41)
Alkaline Phosphatase: 38 U/L (ref 38–126)
BUN: 9 mg/dL (ref 6–20)
CHLORIDE: 108 mmol/L (ref 101–111)
CO2: 24 mmol/L (ref 22–32)
CREATININE: 0.82 mg/dL (ref 0.44–1.00)
Calcium: 9.1 mg/dL (ref 8.9–10.3)
GFR calc Af Amer: >60 mL/min (ref >60)
GLUCOSE: 117 mg/dL — AB (ref 65–99)
POTASSIUM: 3.4 mmol/L — AB (ref 3.5–5.1)
SODIUM: 141 mmol/L (ref 135–145)
Total Bilirubin: 0.6 mg/dL (ref 0.3–1.2)
Total Protein: 6.4 g/dL — ABNORMAL LOW (ref 6.5–8.1)

## 2015-03-19 LAB — URINALYSIS, ROUTINE W REFLEX MICROSCOPIC
BILIRUBIN URINE: NEGATIVE
Glucose, UA: NEGATIVE mg/dL
KETONES UR: 15 mg/dL — AB
Leukocytes, UA: NEGATIVE
NITRITE: NEGATIVE
Protein, ur: NEGATIVE mg/dL
Specific Gravity, Urine: 1.027 (ref 1.005–1.030)
UROBILINOGEN UA: 0.2 mg/dL (ref 0.0–1.0)
pH: 5.5 (ref 5.0–8.0)

## 2015-03-19 LAB — URINE MICROSCOPIC-ADD ON

## 2015-03-19 LAB — POC URINE PREG, ED: Preg Test, Ur: NEGATIVE

## 2015-03-19 LAB — LIPASE, BLOOD: Lipase: 29 U/L (ref 22–51)

## 2015-03-19 NOTE — ED Notes (Signed)
Pt reports right side flank pain with right side lower abdominal pain starting today. Pt denies dysuria, last BM today and normal.

## 2015-03-19 NOTE — ED Provider Notes (Signed)
CSN: [id number]     Arrival date & time 03/19/15  2013 History  This chart was scribed for Varney Biles, MD by Evelene Croon, ED Scribe. This patient was seen in room D34C/D34C and the patient's care was started 12:38 AM.     Chief Complaint  Patient presents with  . Flank Pain  . Abdominal Pain    The history is provided by the patient. No language interpreter was used.     HPI Comments:  Deanna Sparks is a 39 y.o. female who presents to the Emergency Department complaining of intermittent sharp suprapubic abdominal pain that started about 0200 yesterday AM. Pt notes her pain waxes and wanes in severity and radiates to her right flank. She reports associated nausea. She denies heavy vaginal bleeding but notes she is currently spotting. Her LNMP was about 2 months ago which she states is normal for her. She also denies vaginal discharge, risk for STDs and recent abdominal surgery; notes only abdominal surgery was C-section years ago. She took pain meds which resolved the pain but the pain returned around 1500.     Past Medical History  Diagnosis Date  . Genital herpes     last outbreak 12/2011  . Hypertension    Past Surgical History  Procedure Laterality Date  . Cesarean section    .  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 Hypertension Mother   . Anesthesia problems Neg Hx   . Other Neg Hx    History  Substance Use Topics  . Smoking status: Never Smoker   . Smokeless tobacco: Not on file  . Alcohol Use: No   OB History    Gravida Para Term Preterm AB TAB SAB Ectopic Multiple Living   4 3 2 1  0 0 0 0 0 3     Review of Systems  All other systems reviewed and are negative.     Allergies  Codeine and Ultram  Home Medications   Prior to Admission medications   Medication Sig Start Date End Date Taking? Authorizing Provider  acyclovir (ZOVIRAX) 400 MG tablet Take 400 mg by mouth daily as needed (outbreak).    Yes  Historical Provider, MD  Cholecalciferol (VITAMIN D) 400 UNITS capsule Take 400 Units by mouth daily.   Yes Historical Provider, MD  Ronnette Juniper FE 1/20 1-20 MG-MCG tablet Take 1 tablet by mouth daily.  02/04/14  Yes Historical Provider, MD  lisinopril-hydrochlorothiazide (PRINZIDE,ZESTORETIC) 20-12.5 MG per tablet Take 1 tablet by mouth daily.  01/06/14  Yes Historical Provider, MD  HYDROcodone-acetaminophen (NORCO/VICODIN) 5-325 MG per tablet Take 2 tablets by mouth every 4 (four) hours as needed. Patient not taking: Reported on 03/19/2015 02/08/14   Teressa Lower, MD  ibuprofen (ADVIL,MOTRIN) 600 MG tablet Take 1 tablet (600 mg total) by mouth every 6 (six) hours as needed. 03/20/15   Varney Biles, MD  metroNIDAZOLE (FLAGYL) 500 MG tablet Take 1 tablet (500 mg total) by mouth 2 (two) times daily. 03/20/15   Varney Biles, MD  ondansetron (ZOFRAN) 4 MG tablet Take 1 tablet (4 mg total) by mouth every 6 (six) hours. Patient not taking: Reported on 03/19/2015 02/08/14   Teressa Lower, MD   BP 143/101 mmHg  Pulse 71  Temp(Src) 98.7 F (37.1 C) (Oral)  Resp 18  Ht 5' 8"  (1.727 m)  Wt 220 lb (99.791 kg)  BMI 33.46 kg/m2  SpO2 99% Physical Exam  Constitutional:  She is oriented to person, place, and time. She appears well-developed and well-nourished. No distress.  HENT:  Head: Normocephalic and atraumatic.  Eyes: Conjunctivae and EOM are normal. Pupils are equal, round, and reactive to light.  Neck: Normal range of motion. Neck supple.  Cardiovascular: Normal rate, regular rhythm, normal heart sounds and intact distal pulses.   No murmur heard. Pulmonary/Chest: Effort normal and breath sounds normal. No respiratory distress. She has no wheezes.  Abdominal: Soft. Bowel sounds are normal. She exhibits no distension. There is tenderness (Suprapubic and RLQ TTP). There is no rebound and no guarding.  Suprapubic tenderness  Genitourinary: Vagina normal and uterus normal.   Chaperone  was present for exam  which was performed with no discomfort or complications.    Neurological: She is alert and oriented to person, place, and time.  Skin: Skin is warm and dry.  Psychiatric: She has a normal mood and affect.  Nursing note and vitals reviewed.   ED Course  Procedures   DIAGNOSTIC STUDIES:  Oxygen Saturation is 99% on RA, normal by my interpretation.    COORDINATION OF CARE:  12:42 AM Pt updated with partial results.  Discussed treatment plan with pt at bedside and pt agreed to plan.  Labs Review Labs Reviewed  WET PREP, GENITAL - Abnormal; Notable for the following:    Clue Cells Wet Prep HPF POC TOO NUMEROUS TO COUNT (*)    WBC, Wet Prep HPF POC MODERATE (*)    All other components within normal limits  COMPREHENSIVE METABOLIC PANEL - Abnormal; Notable for the following:    Potassium 3.4 (*)    Glucose, Bld 117 (*)    Total Protein 6.4 (*)    Albumin 3.4 (*)    All other components within normal limits  URINALYSIS, ROUTINE W REFLEX MICROSCOPIC (NOT AT [facility]) - Abnormal; Notable for the following:    Color, Urine AMBER (*)    Hgb urine dipstick LARGE (*)    Ketones, ur 15 (*)    All other components within normal limits  URINE MICROSCOPIC-ADD ON - Abnormal; Notable for the following:    Squamous Epithelial / LPF FEW (*)    All other components within normal limits  CBC WITH DIFFERENTIAL/PLATELET  LIPASE, BLOOD  POC URINE PREG, ED  GC/CHLAMYDIA PROBE AMP ([place]) NOT AT [facility]    Imaging Review No results found.   EKG Interpretation None     @2 :20 - pelvic exam was completed, and besides the spotting and mild discharge, was negative. On repeat exam, pt has suprapubic pain only. No McBurney's. Doubt appendicitis right now, but we discussed strict return precautions. Wet prep results pending.  MDM   Final diagnoses:  Pelvic pain in female  Bacterial vaginosis    I personally performed the services described in this documentation, which was scribed in my  presence. The recorded information has been reviewed and is accurate.  Pt comes in with pelvic pain. Pain in the suprapubic region, radiating to the flank region. No uti like sx. No hx of renal stones. On exam - no mcburneys and pt is not toxic.  Pelvic exam ordered. No indication for CT scan based on hx and exam. Pretest probability for large ovarian cyst, torsion is low.  Varney Biles, MD 03/20/15 [phone]

## 2015-03-20 LAB — WET PREP, GENITAL
TRICH WET PREP: NONE SEEN
YEAST WET PREP: NONE SEEN

## 2015-03-20 MED ORDER — IBUPROFEN 600 MG PO TABS: 600.0000 mg | ORAL_TABLET | Freq: Four times a day (QID) | ORAL | Status: DC | PRN

## 2015-03-20 MED ORDER — METRONIDAZOLE 500 MG PO TABS: 500.0000 mg | ORAL_TABLET | Freq: Two times a day (BID) | ORAL | Status: DC

## 2015-03-20 NOTE — Discharge Instructions (Signed)
You have pelvic pain on our exam - but no signs of deep infection. You have bacterial vaginosis - and we have given you the antibiotics for that. Please see your gynecologist in 1 week.  Please return to the ER if your symptoms worsen; you have increased pain, fevers, chills, inability to keep any medications down. Otherwise see the outpatient doctor as requested.   Bacterial Vaginosis Bacterial vaginosis is a vaginal infection that occurs when the normal balance of bacteria in the vagina is disrupted. It results from an overgrowth of certain bacteria. This is the most common vaginal infection in women of childbearing age. Treatment is important to prevent complications, especially in pregnant women, as it can cause a premature delivery. CAUSES  Bacterial vaginosis is caused by an increase in harmful bacteria that are normally present in smaller amounts in the vagina. Several different kinds of bacteria can cause bacterial vaginosis. However, the reason that the condition develops is not fully understood. RISK FACTORS Certain activities or behaviors can put you at an increased risk of developing bacterial vaginosis, including:  Having a new sex partner or multiple sex partners.  Douching.  Using an intrauterine device (IUD) for contraception. Women do not get bacterial vaginosis from toilet seats, bedding, swimming pools, or contact with objects around them. SIGNS AND SYMPTOMS  Some women with bacterial vaginosis have no signs or symptoms. Common symptoms include:  Grey vaginal discharge.  A fishlike odor with discharge, especially after sexual intercourse.  Itching or burning of the vagina and vulva.  Burning or pain with urination. DIAGNOSIS  Your health care provider will take a medical history and examine the vagina for signs of bacterial vaginosis. A sample of vaginal fluid may be taken. Your health care provider will look at this sample under a microscope to check for bacteria  and abnormal cells. A vaginal pH test may also be done.  TREATMENT  Bacterial vaginosis may be treated with antibiotic medicines. These may be given in the form of a pill or a vaginal cream. A second round of antibiotics may be prescribed if the condition comes back after treatment.  HOME CARE INSTRUCTIONS   Only take over-the-counter or prescription medicines as directed by your health care provider.  If antibiotic medicine was prescribed, take it as directed. Make sure you finish it even if you start to feel better.  Do not have sex until treatment is completed.  Tell all sexual partners that you have a vaginal infection. They should see their health care provider and be treated if they have problems, such as a mild rash or itching.  Practice safe sex by using condoms and only having one sex partner. SEEK MEDICAL CARE IF:   Your symptoms are not improving after 3 days of treatment.  You have increased discharge or pain.  You have a fever. MAKE SURE YOU:   Understand these instructions.  Will watch your condition.  Will get help right away if you are not doing well or get worse. FOR MORE INFORMATION  Centers for Disease Control and Prevention, Division of STD Prevention: [employer] American Sexual Health Association (ASHA): www.ashastd.org  Document Released: 10/09/2005 Document Revised: 07/30/2013 Document Reviewed: 05/21/2013 [facility] Patient Information 2015 [place], [place]. This information is not intended to replace advice given to you by your health care provider. Make sure you discuss any questions you have with your health care provider.  Pelvic Pain Female pelvic pain can be caused by many different things and start from a variety of  places. Pelvic pain refers to pain that is located in the lower half of the abdomen and between your hips. The pain may occur over a short period of time (acute) or may be reoccurring (chronic). The cause of pelvic pain may be related to  disorders affecting the female reproductive organs (gynecologic), but it may also be related to the bladder, kidney stones, an intestinal complication, or muscle or skeletal problems. Getting help right away for pelvic pain is important, especially if there has been severe, sharp, or a sudden onset of unusual pain. It is also important to get help right away because some types of pelvic pain can be life threatening.  CAUSES  Below are only some of the causes of pelvic pain. The causes of pelvic pain can be in one of several categories.   Gynecologic.  Pelvic inflammatory disease.  Sexually transmitted infection.  Ovarian cyst or a twisted ovarian ligament (ovarian torsion).  Uterine lining that grows outside the uterus (endometriosis).  Fibroids, cysts, or tumors.  Ovulation.  Pregnancy.  Pregnancy that occurs outside the uterus (ectopic pregnancy).  Miscarriage.  Labor.  Abruption of the placenta or ruptured uterus.  Infection.  Uterine infection (endometritis).  Bladder infection.  Diverticulitis.  Miscarriage related to a uterine infection (septic abortion).  Bladder.  Inflammation of the bladder (cystitis).  Kidney stone(s).  Gastrointestinal.  Constipation.  Diverticulitis.  Neurologic.  Trauma.  Feeling pelvic pain because of mental or emotional causes (psychosomatic).  Cancers of the bowel or pelvis. EVALUATION  Your caregiver will want to take a careful history of your concerns. This includes recent changes in your health, a careful gynecologic history of your periods (menses), and a sexual history. Obtaining your family history and medical history is also important. Your caregiver may suggest a pelvic exam. A pelvic exam will help identify the location and severity of the pain. It also helps in the evaluation of which organ system may be involved. In order to identify the cause of the pelvic pain and be properly treated, your caregiver may order  tests. These tests may include:   A pregnancy test.  Pelvic ultrasonography.  An X-ray exam of the abdomen.  A urinalysis or evaluation of vaginal discharge.  Blood tests. HOME CARE INSTRUCTIONS   Only take over-the-counter or prescription medicines for pain, discomfort, or fever as directed by your caregiver.   Rest as directed by your caregiver.   Eat a balanced diet.   Drink enough fluids to make your urine clear or pale yellow, or as directed.   Avoid sexual intercourse if it causes pain.   Apply warm or cold compresses to the lower abdomen depending on which one helps the pain.   Avoid stressful situations.   Keep a journal of your pelvic pain. Write down when it started, where the pain is located, and if there are things that seem to be associated with the pain, such as food or your menstrual cycle.  Follow up with your caregiver as directed.  SEEK MEDICAL CARE IF:  Your medicine does not help your pain.  You have abnormal vaginal discharge. SEEK IMMEDIATE MEDICAL CARE IF:   You have heavy bleeding from the vagina.   Your pelvic pain increases.   You feel light-headed or faint.   You have chills.   You have pain with urination or blood in your urine.   You have uncontrolled diarrhea or vomiting.   You have a fever or persistent symptoms for more than 3 days.  You have a fever and your symptoms suddenly get worse.   You are being physically or sexually abused.  MAKE SURE YOU:  Understand these instructions.  Will watch your condition.  Will get help if you are not doing well or get worse. Document Released: 09/05/2004 Document Revised: 02/23/2014 Document Reviewed: 01/29/2012 [facility] Patient Information 2015 [place], [place]. This information is not intended to replace advice given to you by your health care provider. Make sure you discuss any questions you have with your health care provider.

## 2015-03-23 LAB — GC/CHLAMYDIA PROBE AMP (~~LOC~~) NOT AT ARMC
Chlamydia: NEGATIVE
Neisseria Gonorrhea: NEGATIVE

## 2015-04-01 ENCOUNTER — Emergency Department (HOSPITAL_COMMUNITY)
Admission: EM | Admit: 2015-04-01 | Discharge: 2015-04-01 | Disposition: A | Discharge status: Home / Self Care | Payer: BLUE CROSS/BLUE SHIELD | Source: Home / Self Care | Attending: Family Medicine | Admitting: Family Medicine

## 2015-04-01 ENCOUNTER — Encounter (HOSPITAL_COMMUNITY): Payer: Self-pay | Admitting: *Deleted

## 2015-04-01 DIAGNOSIS — J029 Acute pharyngitis, unspecified: Secondary | ICD-10-CM

## 2015-04-01 DIAGNOSIS — J039 Acute tonsillitis, unspecified: Secondary | ICD-10-CM

## 2015-04-01 LAB — POCT RAPID STREP A: Streptococcus, Group A Screen (Direct): NEGATIVE

## 2015-04-01 MED ORDER — AMOXICILLIN 500 MG PO CAPS: 500.0000 mg | ORAL_CAPSULE | Freq: Two times a day (BID) | ORAL | Status: DC

## 2015-04-01 MED ORDER — DEXAMETHASONE 4 MG PO TABS
10.0000 mg | ORAL_TABLET | Freq: Once | ORAL | Status: AC
  Administered 2015-04-01: 10 mg via ORAL

## 2015-04-01 MED ORDER — FLUCONAZOLE 150 MG PO TABS: 150.0000 mg | ORAL_TABLET | Freq: Every day | ORAL | Status: DC

## 2015-04-01 MED ORDER — DEXAMETHASONE 4 MG PO TABS
ORAL_TABLET | ORAL | Status: AC
  Filled 2015-04-01: qty 2

## 2015-04-01 MED ORDER — DEXAMETHASONE 2 MG PO TABS
ORAL_TABLET | ORAL | Status: AC
  Filled 2015-04-01: qty 1

## 2015-04-01 NOTE — Discharge Instructions (Signed)
You likely have strep throat. Please start the antibiotics. Please use the Diflucan if you develop a yeast infection. Please use ibuprofen and Tylenol for additional pain relief. You were given a dose of a steroid cold Decadron to help with the tonsil swelling and pain.

## 2015-04-01 NOTE — ED Notes (Signed)
Pt is here with complaints of fever, generalized body aches, sore throat, and cough.

## 2015-04-01 NOTE — ED Provider Notes (Signed)
CSN: [id number]     Arrival date & time 04/01/15  90 History   First MD Initiated Contact with Patient 04/01/15 1846     Chief Complaint  Patient presents with  . URI   (Consider location/radiation/quality/duration/timing/severity/associated sxs/prior Treatment) HPI  3 days of sore throat, HA, body aches, chills, ear aches, and fever to 101. Started w/ sore throat. Getting worse. Denies rhinorrhea, cough, congestion, sinus pain or pressure, neck stiffness, shortness of breath, palpitations, abdominal pain, dysuria, frequency. Patient has tried several over-the-counter medications without relief. 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   Past Medical History  Diagnosis Date  . Genital herpes     last outbreak 12/2011  . Hypertension    Past Surgical History  Procedure Laterality Date  . Cesarean section    .  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 Hypertension Mother   . Anesthesia problems Neg Hx   . Other Neg Hx    History  Substance Use Topics  . Smoking status: Never Smoker   . Smokeless tobacco: Not on file  . Alcohol Use: No   OB History    Gravida Para Term Preterm AB TAB SAB Ectopic Multiple Living   4 3 2 1  0 0 0 0 0 3     Review of Systems Per HPI with all other pertinent systems negative.    Allergies  Codeine and Ultram  Home Medications   Prior to Admission medications   Medication Sig Start Date End Date Taking? Authorizing Provider  acyclovir (ZOVIRAX) 400 MG tablet Take 400 mg by mouth daily as needed (outbreak).     Historical Provider, MD  amoxicillin (AMOXIL) 500 MG capsule Take 1 capsule (500 mg total) by mouth 2 (two) times daily. 04/01/15   Waldemar Dickens, MD  Cholecalciferol (VITAMIN D) 400 UNITS capsule Take 400 Units by mouth daily.    Historical Provider, MD  fluconazole (DIFLUCAN) 150 MG tablet Take 1 tablet (150 mg total) by mouth daily. Repeat dose in 3 days  04/01/15   Waldemar Dickens, MD  HYDROcodone-acetaminophen (NORCO/VICODIN) 5-325 MG per tablet Take 2 tablets by mouth every 4 (four) hours as needed. Patient not taking: Reported on 03/19/2015 02/08/14   Teressa Lower, MD  ibuprofen (ADVIL,MOTRIN) 600 MG tablet Take 1 tablet (600 mg total) by mouth every 6 (six) hours as needed. 03/20/15   Varney Biles, MD  LARIN FE 1/20 1-20 MG-MCG tablet Take 1 tablet by mouth daily.  02/04/14   Historical Provider, MD  lisinopril-hydrochlorothiazide (PRINZIDE,ZESTORETIC) 20-12.5 MG per tablet Take 1 tablet by mouth daily.  01/06/14   Historical Provider, MD  metroNIDAZOLE (FLAGYL) 500 MG tablet Take 1 tablet (500 mg total) by mouth 2 (two) times daily. 03/20/15   Varney Biles, MD  ondansetron (ZOFRAN) 4 MG tablet Take 1 tablet (4 mg total) by mouth every 6 (six) hours. Patient not taking: Reported on 03/19/2015 02/08/14   Teressa Lower, MD   BP 132/82 mmHg  Pulse 98  Temp(Src) 98.8 F (37.1 C) (Oral)  SpO2 100% Physical Exam Physical Exam  Constitutional: oriented to person, place, and time. appears well-developed and well-nourished. No distress.  HENT:  Head: Normocephalic and atraumatic.  Eyes: EOMI. PERRL.  Neck: Normal range of motion.  Cardiovascular: RRR, no m/r/g, 2+ distal pulses,  Pulmonary/Chest: Effort normal and breath sounds normal. No respiratory distress.  Abdominal: Soft. Bowel sounds  are normal. NonTTP, no distension.  Musculoskeletal: Normal range of motion. Non ttp, no effusion.  Neurological: alert and oriented to person, place, and time.  Skin: Skin is warm. No rash noted. non diaphoretic.  Psychiatric: normal mood and affect. behavior is normal. Judgment and thought content normal.   ED Course  Procedures (including critical care time) Labs Review Labs Reviewed - No data to display  Imaging Review No results found.   MDM   1. Sore throat   2. Tonsillitis    Likely strep Start Amox Decadron for tonsillitis Diflucan if  develops yeast infection   Waldemar Dickens, MD 04/01/15 [phone]

## 2015-04-03 LAB — CULTURE, GROUP A STREP: Strep A Culture: POSITIVE — AB

## 2015-04-05 NOTE — ED Notes (Signed)
Group A strep positive final report treated adequately w Rx provided at day of visit

## 2015-05-20 ENCOUNTER — Encounter (HOSPITAL_COMMUNITY): Payer: Self-pay | Admitting: Vascular Surgery

## 2015-05-20 DIAGNOSIS — M545 Low back pain: Secondary | ICD-10-CM | POA: Diagnosis present

## 2015-05-20 DIAGNOSIS — R350 Frequency of micturition: Secondary | ICD-10-CM | POA: Insufficient documentation

## 2015-05-20 DIAGNOSIS — Z3202 Encounter for pregnancy test, result negative: Secondary | ICD-10-CM | POA: Diagnosis not present

## 2015-05-20 DIAGNOSIS — K5900 Constipation, unspecified: Secondary | ICD-10-CM | POA: Insufficient documentation

## 2015-05-20 DIAGNOSIS — Z79899 Other long term (current) drug therapy: Secondary | ICD-10-CM | POA: Insufficient documentation

## 2015-05-20 DIAGNOSIS — I10 Essential (primary) hypertension: Secondary | ICD-10-CM | POA: Insufficient documentation

## 2015-05-20 DIAGNOSIS — Z8619 Personal history of other infectious and parasitic diseases: Secondary | ICD-10-CM | POA: Insufficient documentation

## 2015-05-20 LAB — URINALYSIS, ROUTINE W REFLEX MICROSCOPIC
BILIRUBIN URINE: NEGATIVE
GLUCOSE, UA: NEGATIVE mg/dL
Hgb urine dipstick: NEGATIVE
KETONES UR: NEGATIVE mg/dL
Leukocytes, UA: NEGATIVE
NITRITE: NEGATIVE
Protein, ur: NEGATIVE mg/dL
Specific Gravity, Urine: 1.028 (ref 1.005–1.030)
Urobilinogen, UA: 0.2 mg/dL (ref 0.0–1.0)
pH: 5.5 (ref 5.0–8.0)

## 2015-05-20 LAB — CBC
HCT: 37.7 % (ref 36.0–46.0)
HEMOGLOBIN: 12.9 g/dL (ref 12.0–15.0)
MCH: 29.8 pg (ref 26.0–34.0)
MCHC: 34.2 g/dL (ref 30.0–36.0)
MCV: 87.1 fL (ref 78.0–100.0)
Platelets: 327 10*3/uL (ref 150–400)
RBC: 4.33 MIL/uL (ref 3.87–5.11)
RDW: 13.7 % (ref 11.5–15.5)
WBC: 10.8 10*3/uL — ABNORMAL HIGH (ref 4.0–10.5)

## 2015-05-20 LAB — POC URINE PREG, ED: Preg Test, Ur: NEGATIVE

## 2015-05-20 NOTE — ED Notes (Signed)
Pt reports to the ED for eval of low back pain x 1 week. Pain does not radiate. No numbness, tingling, paralysis, or bowel or bladder changes. Pain is worse with urination. Reports some urinary frequency and slight dysuria. Pt also reports low abd pain x 1 week. Pt denies any N/V/D, vaginal bleeding, or d/c. Pt A&OX4, resp e/u, and skin warm and dry.

## 2015-05-21 ENCOUNTER — Emergency Department (HOSPITAL_COMMUNITY)
Admission: EM | Admit: 2015-05-21 | Discharge: 2015-05-21 | Disposition: A | Discharge status: Home / Self Care | Payer: BLUE CROSS/BLUE SHIELD | Attending: Emergency Medicine | Admitting: Emergency Medicine

## 2015-05-21 DIAGNOSIS — M545 Low back pain: Secondary | ICD-10-CM

## 2015-05-21 LAB — COMPREHENSIVE METABOLIC PANEL
ALT: 18 U/L (ref 14–54)
ANION GAP: 8 (ref 5–15)
AST: 20 U/L (ref 15–41)
Albumin: 3.6 g/dL (ref 3.5–5.0)
Alkaline Phosphatase: 41 U/L (ref 38–126)
BUN: 12 mg/dL (ref 6–20)
CO2: 27 mmol/L (ref 22–32)
CREATININE: 0.96 mg/dL (ref 0.44–1.00)
Calcium: 9 mg/dL (ref 8.9–10.3)
Chloride: 104 mmol/L (ref 101–111)
Glucose, Bld: 125 mg/dL — ABNORMAL HIGH (ref 65–99)
Potassium: 3.5 mmol/L (ref 3.5–5.1)
Sodium: 139 mmol/L (ref 135–145)
Total Bilirubin: 0.7 mg/dL (ref 0.3–1.2)
Total Protein: 6.8 g/dL (ref 6.5–8.1)

## 2015-05-21 MED ORDER — HYDROCODONE-ACETAMINOPHEN 5-325 MG PO TABS: 1.0000 | ORAL_TABLET | ORAL | Status: DC | PRN

## 2015-05-21 MED ORDER — NAPROXEN 500 MG PO TABS: 500.0000 mg | ORAL_TABLET | Freq: Two times a day (BID) | ORAL | Status: DC

## 2015-05-21 MED ORDER — ORPHENADRINE CITRATE ER 100 MG PO TB12: 100.0000 mg | ORAL_TABLET | Freq: Two times a day (BID) | ORAL | Status: DC

## 2015-05-21 NOTE — Discharge Instructions (Signed)
Back Pain, Adult °Low back pain is very common. About 1 in 5 people have back pain. The cause of low back pain is rarely dangerous. The pain often gets better over time. About half of people with a sudden onset of back pain feel better in just 2 weeks. About 8 in 10 people feel better by 6 weeks.  °CAUSES °Some common causes of back pain include: °· Strain of the muscles or ligaments supporting the spine. °· Wear and tear (degeneration) of the spinal discs. °· Arthritis. °· Direct injury to the back. °DIAGNOSIS °Most of the time, the direct cause of low back pain is not known. However, back pain can be treated effectively even when the exact cause of the pain is unknown. Answering your caregiver's questions about your overall health and symptoms is one of the most accurate ways to make sure the cause of your pain is not dangerous. If your caregiver needs more information, he or she may order lab work or imaging tests (X-rays or MRIs). However, even if imaging tests show changes in your back, this usually does not require surgery. °HOME CARE INSTRUCTIONS °For many people, back pain returns. Since low back pain is rarely dangerous, it is often a condition that people can learn to manage on their own.  °· Remain active. It is stressful on the back to sit or stand in one place. Do not sit, drive, or stand in one place for more than 30 minutes at a time. Take short walks on level surfaces as soon as pain allows. Try to increase the length of time you walk each day. °· Do not stay in bed. Resting more than 1 or 2 days can delay your recovery. °· Do not avoid exercise or work. Your body is made to move. It is not dangerous to be active, even though your back may hurt. Your back will likely heal faster if you return to being active before your pain is gone. °· Pay attention to your body when you  bend and lift. Many people have less discomfort when lifting if they bend their knees, keep the load close to their bodies, and  avoid twisting. Often, the most comfortable positions are those that put less stress on your recovering back. °· Find a comfortable position to sleep. Use a firm mattress and lie on your side with your knees slightly bent. If you lie on your back, put a pillow under your knees. °· Only take over-the-counter or prescription medicines as directed by your caregiver. Over-the-counter medicines to reduce pain and inflammation are often the most helpful. Your caregiver may prescribe muscle relaxant drugs. These medicines help dull your pain so you can more quickly return to your normal activities and healthy exercise. °· Put ice on the injured area. °¨ Put ice in a plastic bag. °¨ Place a towel between your skin and the bag. °¨ Leave the ice on for 15-20 minutes, 03-04 times a day for the first 2 to 3 days. After that, ice and heat may be alternated to reduce pain and spasms. °· Ask your caregiver about trying back exercises and gentle massage. This may be of some benefit. °· Avoid feeling anxious or stressed. Stress increases muscle tension and can worsen back pain. It is important to recognize when you are anxious or stressed and learn ways to manage it. Exercise is a great option. °SEEK MEDICAL CARE IF: °· You have pain that is not relieved with rest or medicine. °· You have pain that does not improve in 1 week. °· You have new symptoms. °· You are generally not feeling well. °SEEK   IMMEDIATE MEDICAL CARE IF:   You have pain that radiates from your back into your legs.  You develop new bowel or bladder control problems.  You have unusual weakness or numbness in your arms or legs.  You develop nausea or vomiting.  You develop abdominal pain.  You feel faint. Document Released: 10/09/2005 Document Revised: 04/09/2012 Document Reviewed: 02/10/2014 [facility] Patient Information 2015 [place], [place]. This information is not intended to replace advice given to you by your health care provider. Make sure you  discuss any questions you have with your health care provider.  Naproxen and naproxen sodium oral immediate-release tablets What is this medicine? NAPROXEN (na PROX en) is a non-steroidal anti-inflammatory drug (NSAID). It is used to reduce swelling and to treat pain. This medicine may be used for dental pain, headache, or painful monthly periods. It is also used for painful joint and muscular problems such as arthritis, tendinitis, bursitis, and gout. This medicine may be used for other purposes; ask your health care provider or pharmacist if you have questions. COMMON BRAND NAME(S): Aflaxen, Aleve, Aleve Arthritis, All Day Relief, Anaprox, Anaprox DS, Naprosyn What should I tell my health care provider before I take this medicine? They need to know if you have any of these conditions: -asthma -cigarette smoker -drink more than 3 alcohol containing drinks a day -heart disease or circulation problems such as heart failure or leg edema (fluid retention) -high blood pressure -kidney disease -liver disease -stomach bleeding or ulcers -an unusual or allergic reaction to naproxen, aspirin, other NSAIDs, other medicines, foods, dyes, or preservatives -pregnant or trying to get pregnant -breast-feeding How should I use this medicine? Take this medicine by mouth with a glass of water. Follow the directions on the prescription label. Take it with food if your stomach gets upset. Try to not lie down for at least 10 minutes after you take it. Take your medicine at regular intervals. Do not take your medicine more often than directed. Long-term, continuous use may increase the risk of heart attack or stroke. A special MedGuide will be given to you by the pharmacist with each prescription and refill. Be sure to read this information carefully each time. Talk to your pediatrician regarding the use of this medicine in children. Special care may be needed. Overdosage: If you think you have taken too much of  this medicine contact a poison control center or emergency room at once. NOTE: This medicine is only for you. Do not share this medicine with others. What if I miss a dose? If you miss a dose, take it as soon as you can. If it is almost time for your next dose, take only that dose. Do not take double or extra doses. What may interact with this medicine? -alcohol -aspirin -cidofovir -diuretics -lithium -methotrexate -other drugs for inflammation like ketorolac or prednisone -pemetrexed -probenecid -warfarin This list may not describe all possible interactions. Give your health care provider a list of all the medicines, herbs, non-prescription drugs, or dietary supplements you use. Also tell them if you smoke, drink alcohol, or use illegal drugs. Some items may interact with your medicine. What should I watch for while using this medicine? Tell your doctor or health care professional if your pain does not get better. Talk to your doctor before taking another medicine for pain. Do not treat yourself. This medicine does not prevent heart attack or stroke. In fact, this medicine may increase the chance of a heart attack or stroke. The chance may increase  with longer use of this medicine and in people who have heart disease. If you take aspirin to prevent heart attack or stroke, talk with your doctor or health care professional. Do not take other medicines that contain aspirin, ibuprofen, or naproxen with this medicine. Side effects such as stomach upset, nausea, or ulcers may be more likely to occur. Many medicines available without a prescription should not be taken with this medicine. This medicine can cause ulcers and bleeding in the stomach and intestines at any time during treatment. Do not smoke cigarettes or drink alcohol. These increase irritation to your stomach and can make it more susceptible to damage from this medicine. Ulcers and bleeding can happen without warning symptoms and can cause  death. You may get drowsy or dizzy. Do not drive, use machinery, or do anything that needs mental alertness until you know how this medicine affects you. Do not stand or sit up quickly, especially if you are an older patient. This reduces the risk of dizzy or fainting spells. This medicine can cause you to bleed more easily. Try to avoid damage to your teeth and gums when you brush or floss your teeth. What side effects may I notice from receiving this medicine? Side effects that you should report to your doctor or health care professional as soon as possible: -black or bloody stools, blood in the urine or vomit -blurred vision -chest pain -difficulty breathing or wheezing -nausea or vomiting -severe stomach pain -skin rash, skin redness, blistering or peeling skin, hives, or itching -slurred speech or weakness on one side of the body -swelling of eyelids, throat, lips -unexplained weight gain or swelling -unusually weak or tired -yellowing of eyes or skin Side effects that usually do not require medical attention (report to your doctor or health care professional if they continue or are bothersome): -constipation -headache -heartburn This list may not describe all possible side effects. Call your doctor for medical advice about side effects. You may report side effects to FDA at 1-800-FDA-1088. Where should I keep my medicine? Keep out of the reach of children. Store at room temperature between 15 and 30 degrees C (59 and 86 degrees F). Keep container tightly closed. Throw away any unused medicine after the expiration date. NOTE: This sheet is a summary. It may not cover all possible information. If you have questions about this medicine, talk to your doctor, pharmacist, or health care provider.  2015, Elsevier/Gold Standard. (2009-10-11 20:10:16)  Orphenadrine tablets What is this medicine? ORPHENADRINE (or FEN a dreen) helps to relieve pain and stiffness in muscles and can treat  muscle spasms. This medicine may be used for other purposes; ask your health care provider or pharmacist if you have questions. COMMON BRAND NAME(S): Norflex What should I tell my health care provider before I take this medicine? They need to know if you have any of these conditions: -glaucoma -heart disease -kidney disease -myasthenia gravis -peptic ulcer disease -prostate disease -stomach problems -an unusual or allergic reaction to orphenadrine, other medicines, foods, lactose, dyes, or preservatives -pregnant or trying to get pregnant -breast-feeding How should I use this medicine? Take this medicine by mouth with a full glass of water. Follow the directions on the prescription label. Take your medicine at regular intervals. Do not take your medicine more often than directed. Do not take more than you are told to take. Talk to your pediatrician regarding the use of this medicine in children. Special care may be needed. Patients over 61 years old  may have a stronger reaction and need a smaller dose. Overdosage: If you think you have taken too much of this medicine contact a poison control center or emergency room at once. NOTE: This medicine is only for you. Do not share this medicine with others. What if I miss a dose? If you miss a dose, take it as soon as you can. If it is almost time for your next dose, take only that dose. Do not take double or extra doses. What may interact with this medicine? -alcohol -antihistamines -barbiturates, like phenobarbital -benzodiazepines -cyclobenzaprine -medicines for pain -phenothiazines like chlorpromazine, mesoridazine, prochlorperazine, thioridazine This list may not describe all possible interactions. Give your health care provider a list of all the medicines, herbs, non-prescription drugs, or dietary supplements you use. Also tell them if you smoke, drink alcohol, or use illegal drugs. Some items may interact with your medicine. What  should I watch for while using this medicine? Your mouth may get dry. Chewing sugarless gum or sucking hard candy, and drinking plenty of water may help. Contact your doctor if the problem does not go away or is severe. This medicine may cause dry eyes and blurred vision. If you wear contact lenses you may feel some discomfort. Lubricating drops may help. See your eye doctor if the problem does not go away or is severe. You may get drowsy or dizzy. Do not drive, use machinery, or do anything that needs mental alertness until you know how this medicine affects you. Do not stand or sit up quickly, especially if you are an older patient. This reduces the risk of dizzy or fainting spells. Alcohol may interfere with the effect of this medicine. Avoid alcoholic drinks. What side effects may I notice from receiving this medicine? Side effects that you should report to your doctor or health care professional as soon as possible: -allergic reactions like skin rash, itching or hives, swelling of the face, lips, or tongue -changes in vision -difficulty breathing -fast heartbeat or palpitations -hallucinations -light headedness, fainting spells -vomiting Side effects that usually do not require medical attention (report to your doctor or health care professional if they continue or are bothersome): -dizziness -drowsiness -headache -nausea This list may not describe all possible side effects. Call your doctor for medical advice about side effects. You may report side effects to FDA at 1-800-FDA-1088. Where should I keep my medicine? Keep out of the reach of children. Store at room temperature between 15 and 30 degrees C (59 and 86 degrees F). Protect from light. Keep container tightly closed. Throw away any unused medicine after the expiration date. NOTE: This sheet is a summary. It may not cover all possible information. If you have questions about this medicine, talk to your doctor, pharmacist, or health  care provider.  2015, Elsevier/Gold Standard. (2008-05-05 17:19:12)  Acetaminophen; Hydrocodone tablets or capsules What is this medicine? ACETAMINOPHEN; HYDROCODONE (a set a MEE noe fen; hye droe KOE done) is a pain reliever. It is used to treat mild to moderate pain. This medicine may be used for other purposes; ask your health care provider or pharmacist if you have questions. COMMON BRAND NAME(S): Anexsia, Bancap HC, Ceta-Plus, Co-Gesic, Comfortpak, Dolagesic, [employer], DuoCet, Hydrocet, Hydrogesic, [place], Lorcet HD, Lorcet Plus, Lortab, Margesic H, Maxidone, [place], Polygesic, [place], [place], [place], Vicodin, Vicodin ES, Vicodin HP, Charlane Ferretti What should I tell my health care provider before I take this medicine? They need to know if you have any of these conditions: -brain tumor -Crohn's  disease, inflammatory bowel disease, or ulcerative colitis -drug abuse or addiction -head injury -heart or circulation problems -if you often drink alcohol -kidney disease or problems going to the bathroom -liver disease -lung disease, asthma, or breathing problems -an unusual or allergic reaction to acetaminophen, hydrocodone, other opioid analgesics, other medicines, foods, dyes, or preservatives -pregnant or trying to get pregnant -breast-feeding How should I use this medicine? Take this medicine by mouth. Swallow it with a full glass of water. Follow the directions on the prescription label. If the medicine upsets your stomach, take the medicine with food or milk. Do not take more than you are told to take. Talk to your pediatrician regarding the use of this medicine in children. This medicine is not approved for use in children. Overdosage: If you think you have taken too much of this medicine contact a poison control center or emergency room at once. NOTE: This medicine is only for you. Do not share this medicine with others. What if I miss a dose? If you miss a dose, take it  as soon as you can. If it is almost time for your next dose, take only that dose. Do not take double or extra doses. What may interact with this medicine? -alcohol -antihistamines -isoniazid -medicines for depression, anxiety, or psychotic disturbances -medicines for sleep -muscle relaxants -naltrexone -narcotic medicines (opiates) for pain -phenobarbital -ritonavir -tramadol This list may not describe all possible interactions. Give your health care provider a list of all the medicines, herbs, non-prescription drugs, or dietary supplements you use. Also tell them if you smoke, drink alcohol, or use illegal drugs. Some items may interact with your medicine. What should I watch for while using this medicine? Tell your doctor or health care professional if your pain does not go away, if it gets worse, or if you have new or a different type of pain. You may develop tolerance to the medicine. Tolerance means that you will need a higher dose of the medicine for pain relief. Tolerance is normal and is expected if you take the medicine for a long time. Do not suddenly stop taking your medicine because you may develop a severe reaction. Your body becomes used to the medicine. This does NOT mean you are addicted. Addiction is a behavior related to getting and using a drug for a non-medical reason. If you have pain, you have a medical reason to take pain medicine. Your doctor will tell you how much medicine to take. If your doctor wants you to stop the medicine, the dose will be slowly lowered over time to avoid any side effects. You may get drowsy or dizzy when you first start taking the medicine or change doses. Do not drive, use machinery, or do anything that may be dangerous until you know how the medicine affects you. Stand or sit up slowly. There are different types of narcotic medicines (opiates) for pain. If you take more than one type at the same time, you may have more side effects. Give your  health care provider a list of all medicines you use. Your doctor will tell you how much medicine to take. Do not take more medicine than directed. Call emergency for help if you have problems breathing. The medicine will cause constipation. Try to have a bowel movement at least every 2 to 3 days. If you do not have a bowel movement for 3 days, call your doctor or health care professional. Too much acetaminophen can be very dangerous. Do not take Tylenol (  acetaminophen) or medicines that contain acetaminophen with this medicine. Many non-prescription medicines contain acetaminophen. Always read the labels carefully. What side effects may I notice from receiving this medicine? Side effects that you should report to your doctor or health care professional as soon as possible: -allergic reactions like skin rash, itching or hives, swelling of the face, lips, or tongue -breathing problems -confusion -feeling faint or lightheaded, falls -stomach pain -yellowing of the eyes or skin Side effects that usually do not require medical attention (report to your doctor or health care professional if they continue or are bothersome): -nausea, vomiting -stomach upset This list may not describe all possible side effects. Call your doctor for medical advice about side effects. You may report side effects to FDA at 1-800-FDA-1088. Where should I keep my medicine? Keep out of the reach of children. This medicine can be abused. Keep your medicine in a safe place to protect it from theft. Do not share this medicine with anyone. Selling or giving away this medicine is dangerous and against the law. Store at room temperature between 15 and 30 degrees C (59 and 86 degrees F). Protect from light. Keep container tightly closed. Throw away any unused medicine after the expiration date. Discard unused medicine and used packaging carefully. Pets and children can be harmed if they find used or lost packages. NOTE: This sheet is  a summary. It may not cover all possible information. If you have questions about this medicine, talk to your doctor, pharmacist, or health care provider.  2015, Elsevier/Gold Standard. (2013-06-02 13:15:56)

## 2015-05-21 NOTE — ED Provider Notes (Signed)
CSN: [id number]     Arrival date & time 05/20/15  2247 History  This chart was scribed for Delora Fuel, MD by Eustaquio Maize, ED Scribe. This patient was seen in room A06C/A06C and the patient's care was started at 2:12 AM.  Chief Complaint  Patient presents with  . Back Pain  . Abdominal Pain   The history is provided by the patient. No language interpreter was used.    HPI Comments: Deanna Sparks is a 39 y.o. female who presents to the Emergency Department complaining of gradual onset, lower, diffuse, atraumatic, back pain x 1 week. She describes the pain as varying in sharp and throbbing in sensation and rates it as a 6/10 on the pain scale. The pain is exacerbated when pt urinates and with movement. She has taken Ibuprofen with mild relief.  Pt also complains of urinary frequency and constipation. Denies urinary or bowel incontinence, weakness, numbness, tingling, or any other associated symptoms.   Past Medical History  Diagnosis Date  . Genital herpes     last outbreak 12/2011  . Hypertension    Past Surgical History  Procedure Laterality Date  . Cesarean section    .  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 Hypertension Mother   . Anesthesia problems Neg Hx   . Other Neg Hx    History  Substance Use Topics  . Smoking status: Never Smoker   . Smokeless tobacco: Not on file  . Alcohol Use: No   OB History    Gravida Para Term Preterm AB TAB SAB Ectopic Multiple Living   4 3 2 1  0 0 0 0 0 3     Review of Systems  Gastrointestinal: Positive for constipation.  Genitourinary: Positive for frequency. Negative for urgency.  Musculoskeletal: Positive for back pain.  Neurological: Negative for weakness and numbness.  All other systems reviewed and are negative.  Allergies  Codeine and Ultram  Home Medications   Prior to Admission medications   Medication Sig Start Date End Date Taking? Authorizing Provider   Cholecalciferol (VITAMIN D) 400 UNITS capsule Take 400 Units by mouth daily.   Yes Historical Provider, MD  Ronnette Juniper FE 1/20 1-20 MG-MCG tablet Take 1 tablet by mouth daily.  02/04/14  Yes Historical Provider, MD  lisinopril-hydrochlorothiazide (PRINZIDE,ZESTORETIC) 20-12.5 MG per tablet Take 1 tablet by mouth daily.  01/06/14  Yes Historical Provider, MD  amoxicillin (AMOXIL) 500 MG capsule Take 1 capsule (500 mg total) by mouth 2 (two) times daily. Patient not taking: Reported on 05/21/2015 04/01/15   Waldemar Dickens, MD  fluconazole (DIFLUCAN) 150 MG tablet Take 1 tablet (150 mg total) by mouth daily. Repeat dose in 3 days Patient not taking: Reported on 05/21/2015 04/01/15   Waldemar Dickens, MD  HYDROcodone-acetaminophen (NORCO/VICODIN) 5-325 MG per tablet Take 2 tablets by mouth every 4 (four) hours as needed. Patient not taking: Reported on 03/19/2015 02/08/14   Teressa Lower, MD  ibuprofen (ADVIL,MOTRIN) 600 MG tablet Take 1 tablet (600 mg total) by mouth every 6 (six) hours as needed. Patient not taking: Reported on 05/21/2015 03/20/15   Varney Biles, MD  metroNIDAZOLE (FLAGYL) 500 MG tablet Take 1 tablet (500 mg total) by mouth 2 (two) times daily. Patient not taking: Reported on 05/21/2015 03/20/15   Varney Biles, MD  ondansetron (ZOFRAN) 4 MG tablet Take 1 tablet (4 mg total) by mouth every 6 (six) hours. Patient  not taking: Reported on 03/19/2015 02/08/14   Teressa Lower, MD   Triage Vitals: BP 137/81 mmHg  Pulse 91  Temp(Src) 98.5 F (36.9 C) (Oral)  Resp 16  Ht 5' 8"  (1.727 m)  Wt 226 lb 6.4 oz (102.694 kg)  BMI 34.43 kg/m2  SpO2 98%  LMP 04/24/2015 (Approximate)   Physical Exam  Constitutional: She is oriented to person, place, and time. She appears well-developed and well-nourished. No distress.  HENT:  Head: Normocephalic and atraumatic.  Eyes: EOM are normal. Pupils are equal, round, and reactive to light.  Neck: Normal range of motion. Neck supple. No JVD present.  Cardiovascular:  Normal rate, regular rhythm and normal heart sounds.   No murmur heard. Pulmonary/Chest: Effort normal and breath sounds normal. She has no wheezes. She has no rales. She exhibits no tenderness.  Abdominal: Soft. Bowel sounds are normal. She exhibits no distension and no mass. There is no tenderness.  Musculoskeletal: Normal range of motion. She exhibits tenderness. She exhibits no edema.  Tender in the lower lumbar spine Moderate bilateral para lumbar spasms with tenderness Positive straight leg raise on the right at 60 degrees   Lymphadenopathy:    She has no cervical adenopathy.  Neurological: She is alert and oriented to person, place, and time. No cranial nerve deficit. She exhibits normal muscle tone. Coordination normal.  Skin: Skin is warm and dry. No rash noted.  Psychiatric: She has a normal mood and affect. Her behavior is normal. Judgment and thought content normal.  Nursing note and vitals reviewed.   ED Course  Procedures (including critical care time)  DIAGNOSTIC STUDIES: Oxygen Saturation is 98% on RA, normal by my interpretation.    COORDINATION OF CARE: 2:17 AM-Discussed treatment plan which includes Rx muscle relaxer, Naproxen, and Percocet with pt at bedside and pt agreed to plan.   Labs Review Results for orders placed or performed during the hospital encounter of 05/21/15  Comprehensive metabolic panel  Result Value Ref Range   Sodium 139 135 - 145 mmol/L   Potassium 3.5 3.5 - 5.1 mmol/L   Chloride 104 101 - 111 mmol/L   CO2 27 22 - 32 mmol/L   Glucose, Bld 125 (H) 65 - 99 mg/dL   BUN 12 6 - 20 mg/dL   Creatinine, Ser 0.96 0.44 - 1.00 mg/dL   Calcium 9.0 8.9 - 10.3 mg/dL   Total Protein 6.8 6.5 - 8.1 g/dL   Albumin 3.6 3.5 - 5.0 g/dL   AST 20 15 - 41 U/L   ALT 18 14 - 54 U/L   Alkaline Phosphatase 41 38 - 126 U/L   Total Bilirubin 0.7 0.3 - 1.2 mg/dL   GFR calc non Af Amer >60 >60 mL/min   GFR calc Af Amer >60 >60 mL/min   Anion gap 8 5 - 15  CBC   Result Value Ref Range   WBC 10.8 (H) 4.0 - 10.5 K/uL   RBC 4.33 3.87 - 5.11 MIL/uL   Hemoglobin 12.9 12.0 - 15.0 g/dL   HCT 37.7 36.0 - 46.0 %   MCV 87.1 78.0 - 100.0 fL   MCH 29.8 26.0 - 34.0 pg   MCHC 34.2 30.0 - 36.0 g/dL   RDW 13.7 11.5 - 15.5 %   Platelets 327 150 - 400 K/uL  Urinalysis, Routine w reflex microscopic (not at [facility])  Result Value Ref Range   Color, Urine YELLOW YELLOW   APPearance CLEAR CLEAR   Specific Gravity, Urine 1.028 1.005 -  1.030   pH 5.5 5.0 - 8.0   Glucose, UA NEGATIVE NEGATIVE mg/dL   Hgb urine dipstick NEGATIVE NEGATIVE   Bilirubin Urine NEGATIVE NEGATIVE   Ketones, ur NEGATIVE NEGATIVE mg/dL   Protein, ur NEGATIVE NEGATIVE mg/dL   Urobilinogen, UA 0.2 0.0 - 1.0 mg/dL   Nitrite NEGATIVE NEGATIVE   Leukocytes, UA NEGATIVE NEGATIVE  POC urine preg, ED (not at [facility])  Result Value Ref Range   Preg Test, Ur NEGATIVE NEGATIVE   MDM   Final diagnoses:  Low back pain without sciatica, unspecified back pain laterality    Low back pain which appears to be musculoskeletal. Old records are reviewed and she has no prior ED visits for back problems. She is discharged with prescriptions for hydrocodone-acetaminophen, orphenadrine, and naproxen.   I personally performed the services described in this documentation, which was scribed in my presence. The recorded information has been reviewed and is accurate.       Delora Fuel, MD 90/93/11 2162

## 2015-10-22 DIAGNOSIS — G5603 Carpal tunnel syndrome, bilateral upper limbs: Secondary | ICD-10-CM | POA: Insufficient documentation

## 2015-10-22 DIAGNOSIS — G5600 Carpal tunnel syndrome, unspecified upper limb: Secondary | ICD-10-CM | POA: Insufficient documentation

## 2015-11-29 ENCOUNTER — Ambulatory Visit (INDEPENDENT_AMBULATORY_CARE_PROVIDER_SITE_OTHER): Payer: BLUE CROSS/BLUE SHIELD

## 2015-11-29 ENCOUNTER — Encounter: Payer: Self-pay | Admitting: Sports Medicine

## 2015-11-29 ENCOUNTER — Ambulatory Visit (INDEPENDENT_AMBULATORY_CARE_PROVIDER_SITE_OTHER): Payer: BLUE CROSS/BLUE SHIELD | Admitting: Sports Medicine

## 2015-11-29 VITALS — BP 135/97 | HR 87 | Resp 17

## 2015-11-29 DIAGNOSIS — M79673 Pain in unspecified foot: Secondary | ICD-10-CM

## 2015-11-29 DIAGNOSIS — M722 Plantar fascial fibromatosis: Secondary | ICD-10-CM | POA: Diagnosis not present

## 2015-11-29 MED ORDER — MELOXICAM 15 MG PO TABS: 15.0000 mg | ORAL_TABLET | Freq: Every day | ORAL | Status: DC

## 2015-11-29 MED ORDER — TRIAMCINOLONE ACETONIDE 10 MG/ML IJ SUSP: 10.0000 mg | Freq: Once | INTRAMUSCULAR | Status: DC

## 2015-11-29 MED ORDER — METHYLPREDNISOLONE 4 MG PO TBPK: ORAL_TABLET | ORAL | Status: DC

## 2015-11-29 NOTE — Progress Notes (Signed)
Patient ID: Deanna Sparks, female   DOB: 06-15-1976, 40 y.o.   MRN: [id number] Subjective: Deanna Sparks is a 40 y.o. female patient returns to office with complaint of heel pain bilateral. 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 Patient has treated this problem with injections, fascial brace, night splint, motrin; last treated 1 year ago; states that at that time she was told that she should have surgery but she did not have the $ or time to do it. Patient states that she got most relief from the injections in the past and wants to re-discuss her treatment options.   Denies any known radiculopathy however reports that she hurts all over in multiple joints; denies hx of known arthridity.   Patient Active Problem List   Diagnosis Date Noted  . Preterm labor 04/03/2012   Current Outpatient Prescriptions on File Prior to Visit  Medication Sig Dispense Refill  . Cholecalciferol (VITAMIN D) 400 UNITS capsule Take 400 Units by mouth daily.    Marland Kitchen HYDROcodone-acetaminophen (NORCO) 5-325 MG per tablet Take 1 tablet by mouth every 4 (four) hours as needed for moderate pain. 20 tablet 0  . LARIN FE 1/20 1-20 MG-MCG tablet Take 1 tablet by mouth daily.     Marland Kitchen lisinopril-hydrochlorothiazide (PRINZIDE,ZESTORETIC) 20-12.5 MG per tablet Take 1 tablet by mouth daily.     . naproxen (NAPROSYN) 500 MG tablet Take 1 tablet (500 mg total) by mouth 2 (two) times daily. 30 tablet 0  . orphenadrine (NORFLEX) 100 MG tablet Take 1 tablet (100 mg total) by mouth 2 (two) times daily. 30 tablet 0   Current Facility-Administered Medications on File Prior to Visit  Medication Dose Route Frequency Provider Last Rate Last Dose  . triamcinolone acetonide (KENALOG) 10 MG/ML injection 10 mg  10 mg Other Once Bronson Ing, DPM       Allergies  Allergen Reactions  . Codeine Itching  . Ultram [Tramadol] Itching     Objective: Physical Exam General: The patient is alert and  oriented x3 in no acute distress.  Dermatology: Skin is warm, dry and supple bilateral lower extremities. Nails 1-10 are normal. There is no erythema, edema, no eccymosis, no open lesions present. Integument is otherwise unremarkable.  Vascular: Dorsalis Pedis pulse and Posterior Tibial pulse are 2/4 bilateral. Capillary fill time is immediate to all digits.  Neurological: Grossly intact to light touch with an achilles reflex of +2/5 and a  negative Tinel's sign bilateral.  Musculoskeletal: Tenderness to palpation at the medial calcaneal tubercale and through the insertion of the plantar fascia bilateral feet. No pain with compression of calcaneus bilateral. No pain with tuning fork to calcaneus bilateral. No pain with calf compression bilateral. All joints range of motion within normal limits bilateral. Strength 5/5 in all groups bilateral.    Xray, Right/Left foot: Normal osseous mineralization. Joint spaces preserved. No fracture/dislocation/boney destruction. Calcaneal spur present with moderate thickening of plantar fascia and calification at insertion. No other soft tissue abnormalities or radiopaque foreign bodies.   Assessment and Plan: Problem List Items Addressed This Visit    None    Visit Diagnoses    Foot pain, unspecified laterality    -  Primary    Relevant Medications    methylPREDNISolone (MEDROL DOSEPAK) 4 MG TBPK tablet    meloxicam (MOBIC) 15 MG tablet    triamcinolone acetonide (KENALOG) 10 MG/ML injection 10 mg    Other Relevant Orders    DG Foot  2 Views Left    DG Foot 2 Views Right    Plantar fasciitis, bilateral        Relevant Medications    methylPREDNISolone (MEDROL DOSEPAK) 4 MG TBPK tablet    meloxicam (MOBIC) 15 MG tablet    triamcinolone acetonide (KENALOG) 10 MG/ML injection 10 mg       -Complete examination performed. Discussed with patient in detail the condition of plantar fasciitis, how this occurs and general treatment options. Explained both  conservative and surgical treatments.  -After oral consent and aseptic prep, injected a mixture containing 1 ml of 2%  plain lidocaine, 1 ml 0.5% plain marcaine, 0.5 ml of kenalog 10 and 0.5 ml of dexamethasone phosphate into bilateral heels. Post-injection care discussed with patient.  -Rx Meloxicam and Medrol dose pack  -Recommended good supportive shoes and advised use of inserts.  -Advised patient to return to use of fascial brace/ night splint which she owns -Explained and dispensed to patient daily stretching exercises. -Recommend patient to ice affected area 1-2x daily. -Patient to return to office in 3 weeks for follow up or sooner if problems or questions arise. If no improvement will order arthritic panel and proceed with surgery scheduling according to patient's availabilty.   Landis Martins, DPM

## 2015-11-29 NOTE — Patient Instructions (Signed)

## 2015-12-06 ENCOUNTER — Emergency Department (HOSPITAL_COMMUNITY)
Admission: EM | Admit: 2015-12-06 | Discharge: 2015-12-06 | Disposition: A | Discharge status: Home / Self Care | Payer: BLUE CROSS/BLUE SHIELD | Attending: Emergency Medicine | Admitting: Emergency Medicine

## 2015-12-06 ENCOUNTER — Emergency Department (HOSPITAL_COMMUNITY): Payer: BLUE CROSS/BLUE SHIELD

## 2015-12-06 ENCOUNTER — Encounter (HOSPITAL_COMMUNITY): Payer: Self-pay | Admitting: *Deleted

## 2015-12-06 DIAGNOSIS — Z8619 Personal history of other infectious and parasitic diseases: Secondary | ICD-10-CM | POA: Insufficient documentation

## 2015-12-06 DIAGNOSIS — M25572 Pain in left ankle and joints of left foot: Secondary | ICD-10-CM | POA: Insufficient documentation

## 2015-12-06 DIAGNOSIS — I10 Essential (primary) hypertension: Secondary | ICD-10-CM | POA: Insufficient documentation

## 2015-12-06 DIAGNOSIS — Z79899 Other long term (current) drug therapy: Secondary | ICD-10-CM | POA: Diagnosis not present

## 2015-12-06 DIAGNOSIS — Z7952 Long term (current) use of systemic steroids: Secondary | ICD-10-CM | POA: Diagnosis not present

## 2015-12-06 DIAGNOSIS — Z791 Long term (current) use of non-steroidal anti-inflammatories (NSAID): Secondary | ICD-10-CM | POA: Diagnosis not present

## 2015-12-06 DIAGNOSIS — R200 Anesthesia of skin: Secondary | ICD-10-CM | POA: Insufficient documentation

## 2015-12-06 MED ORDER — ACETAMINOPHEN 500 MG PO TABS
1000.0000 mg | ORAL_TABLET | Freq: Once | ORAL | Status: AC
  Administered 2015-12-06: 1000 mg via ORAL
  Filled 2015-12-06: qty 2

## 2015-12-06 MED ORDER — ACETAMINOPHEN 325 MG PO TABS: 650.0000 mg | ORAL_TABLET | Freq: Four times a day (QID) | ORAL | Status: DC | PRN

## 2015-12-06 MED ORDER — KETOROLAC TROMETHAMINE 30 MG/ML IJ SOLN
30.0000 mg | Freq: Once | INTRAMUSCULAR | Status: AC
  Administered 2015-12-06: 30 mg via INTRAMUSCULAR
  Filled 2015-12-06: qty 1

## 2015-12-06 MED ORDER — NAPROXEN 500 MG PO TABS: 500.0000 mg | ORAL_TABLET | Freq: Two times a day (BID) | ORAL | Status: DC

## 2015-12-06 NOTE — ED Notes (Signed)
Pt ambulates independently and with steady gait at time of discharge. Discharge instructions and follow up information reviewed with patient. No other questions or concerns voiced at this time. Rx x 2 given

## 2015-12-06 NOTE — Discharge Instructions (Signed)
Your x-ray of your ankle showed a bone spur in your heel bone but was otherwise normal. You likely have an overuse injury. I will give you a work note and a couple prescriptions to help with the pain and swelling. You may wear the ankle brace we gave you for the next couple of days to help with compression. Please follow up with ortho next week if your symptoms do not improve. Please also call your primary care provider to schedule a follow up appointment. Return to the ER for new or worsening symptoms.

## 2015-12-06 NOTE — ED Notes (Signed)
Pt is here with left ankle pain since 2 days ago and states does not remember injury.  Pt has some swelling.

## 2015-12-06 NOTE — ED Provider Notes (Signed)
CSN: [id number]     Arrival date & time 12/06/15  1316 History  By signing my name below, I, Deanna Sparks, attest that this documentation has been prepared under the direction and in the presence of non-physician practitioner, Delrae Rend, PA-C. Electronically Signed: Dora Sparks, Scribe. 12/06/2015. 2:45 PM.    Chief Complaint  Patient presents with  . Ankle Pain    The history is provided by the patient. No language interpreter was used.     HPI Comments: Deanna Sparks is a 40 y.o. female with h/o HTN who presents to the Emergency Department complaining of sudden onset, worsening, left ankle pain beginning two days ago. She states that her ankle pain is at its worst today and she is unable to ambulate. Pt does not know how she injured her left ankle. Her pain is located on her inner left ankle and she states that the pain radiates into her left leg. 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 Pt states that her left foot becomes numb when the pain flares up but that she is not experiencing numbness or tingling otherwise. She has been taking ibuprofen and applying ice for the pain with no relief. She denies self-injury, fever, or any other associated symptoms at this time.    Past Medical History  Diagnosis Date  . Genital herpes     last outbreak 12/2011  . Hypertension    Past Surgical History  Procedure Laterality Date  . Cesarean section    .  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 Hypertension Mother   . Anesthesia problems Neg Hx   . Other Neg Hx    Social History  Substance Use Topics  . Smoking status: Never Smoker   . Smokeless tobacco: None  . Alcohol Use: No   OB History    Gravida Para Term Preterm AB TAB SAB Ectopic Multiple Living   4 3 2 1  0 0 0 0 0 3     Review of Systems  Constitutional: Negative for fever.  Musculoskeletal: Positive for joint swelling and arthralgias (left ankle).  Neurological: Positive  for numbness (when pain flares up).  Psychiatric/Behavioral: Negative for self-injury.  All other systems reviewed and are negative.     Allergies  Codeine and Ultram  Home Medications   Prior to Admission medications   Medication Sig Start Date End Date Taking? Authorizing Provider  Cholecalciferol (VITAMIN D) 400 UNITS capsule Take 400 Units by mouth daily.    Historical Provider, MD  HYDROcodone-acetaminophen (NORCO) 5-325 MG per tablet Take 1 tablet by mouth every 4 (four) hours as needed for moderate pain. 8/45/36   Delora Fuel, MD  Ronnette Juniper FE 4/68 1-20 MG-MCG tablet Take 1 tablet by mouth daily.  02/04/14   Historical Provider, MD  lisinopril-hydrochlorothiazide (PRINZIDE,ZESTORETIC) 20-12.5 MG per tablet Take 1 tablet by mouth daily.  01/06/14   Historical Provider, MD  meloxicam (MOBIC) 15 MG tablet Take 1 tablet (15 mg total) by mouth daily. 11/29/15   Landis Martins, DPM  methylPREDNISolone (MEDROL DOSEPAK) 4 MG TBPK tablet TAKE AS INSTRUCTED 11/29/15   Landis Martins, DPM  naproxen (NAPROSYN) 500 MG tablet Take 1 tablet (500 mg total) by mouth 2 (two) times daily. 0/32/12   Delora Fuel, MD  orphenadrine (NORFLEX) 100 MG tablet Take 1 tablet (100 mg total) by mouth 2 (two) times daily. 2/48/25   Delora Fuel, MD   BP  151/95 mmHg  Pulse 79  Temp(Src) 98.7 F (37.1 C) (Oral)  Resp 16  SpO2 99%  LMP 10/05/2015 Physical Exam  Constitutional: She appears well-developed and well-nourished. No distress.  HENT:  Head: Normocephalic and atraumatic.  Right Ear: External ear normal.  Left Ear: External ear normal.  Eyes: Conjunctivae are normal. Right eye exhibits no discharge. Left eye exhibits no discharge. No scleral icterus.  Neck: Neck supple. No tracheal deviation present.  Cardiovascular: Normal rate, regular rhythm and intact distal pulses.   Pulmonary/Chest: Effort normal and breath sounds normal. No stridor. No respiratory distress. She has no wheezes. She has no rales.   Abdominal: Soft. Bowel sounds are normal. She exhibits no distension. There is no tenderness. There is no rebound and no guarding.  Musculoskeletal:  Left ankle tender along lateral aspect. No edema or erythema. FROM with no laxity.  Neurological: She is alert. She has normal strength. No cranial nerve deficit (no facial droop, extraocular movements intact, no slurred speech) or sensory deficit. She exhibits normal muscle tone. She displays no seizure activity. Coordination normal.  Skin: Skin is warm and dry. No rash noted.  Psychiatric: She has a normal mood and affect.  Nursing note and vitals reviewed.   ED Course  Procedures (including critical care time)  DIAGNOSTIC STUDIES: Oxygen Saturation is 99% on RA, normal by my interpretation.    COORDINATION OF CARE:  3:04 PM Will administer Toradol and Tylenol. Will apply ASO ankle. Discussed treatment plan with pt at bedside and pt agreed to plan.   Labs Review Labs Reviewed - No data to display  Imaging Review Dg Ankle Complete Left  12/06/2015  CLINICAL DATA:  Left ankle pain starting 2 days ago, no known injury EXAM: LEFT ANKLE COMPLETE - 3+ VIEW COMPARISON:  02/20/2014 FINDINGS: Three views of the left ankle submitted. No acute fracture or subluxation. There is small plantar spur of calcaneus. Ankle mortise is preserved. IMPRESSION: No acute fracture or subluxation.  Small plantar spur of calcaneus. Electronically Signed   By: Lahoma Crocker M.D.   On: 12/06/2015 14:31      EKG Interpretation None      MDM   Final diagnoses:  Left ankle pain    X-ray negative. Suspect overuse injury. Will apply ASO ankle brace. Encouraged tylenol and/or NSAIDs. Will give work note to allow pt to rest. ER return precautions givehn.   I personally performed the services described in this documentation, which was scribed in my presence. The recorded information has been reviewed and is accurate.   Anne Ng, PA-C 12/07/15 1517  Orlie Dakin, MD 12/07/15 [phone]

## 2015-12-20 ENCOUNTER — Ambulatory Visit (INDEPENDENT_AMBULATORY_CARE_PROVIDER_SITE_OTHER): Payer: BLUE CROSS/BLUE SHIELD | Admitting: Sports Medicine

## 2015-12-20 ENCOUNTER — Encounter: Payer: Self-pay | Admitting: Sports Medicine

## 2015-12-20 DIAGNOSIS — M79673 Pain in unspecified foot: Secondary | ICD-10-CM

## 2015-12-20 DIAGNOSIS — M722 Plantar fascial fibromatosis: Secondary | ICD-10-CM

## 2015-12-20 NOTE — Progress Notes (Signed)
Patient ID: Deanna Sparks, female   DOB: 1976/03/15, 40 y.o.   MRN: [id number] Subjective: Deanna Sparks is a 40 y.o. female patient returns to office for follow up eval of heel pain bilateral. Patient states that her heels still hurt; had nightmares with steroid and has occassionally taken her anti-inflammatory. Patient has tried multiple conservative treatments and is at the point of considering surgical intervention. No other issues noted.  Reports that she did go to ER for right ankle swelling or which they did xrays and gave her naprosyn; states that this is all better now.  Patient Active Problem List   Diagnosis Date Noted  . Preterm labor 04/03/2012   Current Outpatient Prescriptions on File Prior to Visit  Medication Sig Dispense Refill  . acetaminophen (TYLENOL) 325 MG tablet Take 2 tablets (650 mg total) by mouth every 6 (six) hours as needed. 30 tablet 0  . Cholecalciferol (VITAMIN D) 400 UNITS capsule Take 400 Units by mouth daily.    Marland Kitchen HYDROcodone-acetaminophen (NORCO) 5-325 MG per tablet Take 1 tablet by mouth every 4 (four) hours as needed for moderate pain. 20 tablet 0  . LARIN FE 1/20 1-20 MG-MCG tablet Take 1 tablet by mouth daily.     Marland Kitchen lisinopril-hydrochlorothiazide (PRINZIDE,ZESTORETIC) 20-12.5 MG per tablet Take 1 tablet by mouth daily.     . meloxicam (MOBIC) 15 MG tablet Take 1 tablet (15 mg total) by mouth daily. 30 tablet 0  . methylPREDNISolone (MEDROL DOSEPAK) 4 MG TBPK tablet TAKE AS INSTRUCTED 21 tablet 0  . naproxen (NAPROSYN) 500 MG tablet Take 1 tablet (500 mg total) by mouth 2 (two) times daily. 30 tablet 0  . naproxen (NAPROSYN) 500 MG tablet Take 1 tablet (500 mg total) by mouth 2 (two) times daily. 30 tablet 0  . orphenadrine (NORFLEX) 100 MG tablet Take 1 tablet (100 mg total) by mouth 2 (two) times daily. 30 tablet 0   Current Facility-Administered Medications on File Prior to Visit  Medication Dose Route Frequency Provider Last Rate Last Dose  .  triamcinolone acetonide (KENALOG) 10 MG/ML injection 10 mg  10 mg Other Once [employer], DPM      . triamcinolone acetonide (KENALOG) 10 MG/ML injection 10 mg  10 mg Other Once Landis Martins, DPM       Allergies  Allergen Reactions  . Codeine Itching  . Ultram [Tramadol] Itching     Objective: Physical Exam General: The patient is alert and oriented x3 in no acute distress.  Dermatology: Skin is warm, dry and supple bilateral lower extremities. Nails 1-10 are normal. There is no erythema, edema, no eccymosis, no open lesions present. Integument is otherwise unremarkable.  Vascular: Dorsalis Pedis pulse and Posterior Tibial pulse are 2/4 bilateral. Capillary fill time is immediate to all digits.  Neurological: Grossly intact to light touch with an achilles reflex of +2/5 and a  negative Tinel's sign bilateral.  Musculoskeletal: Tenderness to palpation at the medial calcaneal tubercale and through the insertion of the plantar fascia bilateral feet. No pain or instability to right ankle. No pain with compression of calcaneus bilateral. No pain with tuning fork to calcaneus bilateral. No pain with calf compression bilateral. All joints range of motion within normal limits bilateral. Strength 5/5 in all groups bilateral.   Assessment and Plan: Problem List Items Addressed This Visit    None    Visit Diagnoses    Foot pain, unspecified laterality    -  Primary    Relevant  Orders    Uric Acid    Rheumatoid factor    ANA    C-reactive protein    CBC with Differential    Basic Metabolic Panel    [id number] Antigen    Sedimentation Rate    Plantar fasciitis, bilateral        Relevant Orders    Uric Acid    Rheumatoid factor    ANA    C-reactive protein    CBC with Differential    Basic Metabolic Panel    [id number] Antigen    Sedimentation Rate    Plantar fascial fibromatosis        Relevant Orders    Uric Acid    Rheumatoid factor    ANA    C-reactive protein    CBC  with Differential    Basic Metabolic Panel    [id number] Antigen    Sedimentation Rate      -Complete examination performed. Discussed with patient in detail the condition of plantar fasciitis, how this occurs and general treatment options. Explained both conservative and surgical treatments.  -Rx arthritic panel to r/o underlying inflammatory condition in the setting of chronic symptoms since 2010 that have failed conservative care -Recommended good supportive shoes and advised use of inserts.  -Advised patient to cont with use of fascial brace/ night splint which she owns -Cont daily stretching exercises. -Recommend patient to cont to ice affected area 1-2x daily. -Patient to return to office in 2 weeks for follow up or sooner if problems or questions arise; will review blood work and proceed with surgery scheduling for bilateral fasciotomies according to patient's availabilty.   Landis Martins, DPM

## 2015-12-31 LAB — BASIC METABOLIC PANEL
BUN: 12 mg/dL (ref 7–25)
CO2: 26 mmol/L (ref 20–31)
Calcium: 9.6 mg/dL (ref 8.6–10.2)
Chloride: 104 mmol/L (ref 98–110)
Creat: 0.75 mg/dL (ref 0.50–1.10)
Glucose, Bld: 71 mg/dL (ref 65–99)
POTASSIUM: 3.9 mmol/L (ref 3.5–5.3)
SODIUM: 140 mmol/L (ref 135–146)

## 2015-12-31 LAB — CBC WITH DIFFERENTIAL/PLATELET
BASOS PCT: 0 % (ref 0–1)
Basophils Absolute: 0 10*3/uL (ref 0.0–0.1)
Eosinophils Absolute: 0.5 10*3/uL (ref 0.0–0.7)
Eosinophils Relative: 5 % (ref 0–5)
HCT: 43.6 % (ref 36.0–46.0)
HEMOGLOBIN: 14.4 g/dL (ref 12.0–15.0)
Lymphocytes Relative: 28 % (ref 12–46)
Lymphs Abs: 2.6 10*3/uL (ref 0.7–4.0)
MCH: 29.1 pg (ref 26.0–34.0)
MCHC: 33 g/dL (ref 30.0–36.0)
MCV: 88.3 fL (ref 78.0–100.0)
MPV: 9.7 fL (ref 8.6–12.4)
Monocytes Absolute: 0.7 10*3/uL (ref 0.1–1.0)
Monocytes Relative: 8 % (ref 3–12)
NEUTROS ABS: 5.5 10*3/uL (ref 1.7–7.7)
Neutrophils Relative %: 59 % (ref 43–77)
PLATELETS: 329 10*3/uL (ref 150–400)
RBC: 4.94 MIL/uL (ref 3.87–5.11)
RDW: 13.8 % (ref 11.5–15.5)
WBC: 9.3 10*3/uL (ref 4.0–10.5)

## 2015-12-31 LAB — SEDIMENTATION RATE: SED RATE: 1 mm/h (ref 0–20)

## 2015-12-31 LAB — ANA: Anti Nuclear Antibody(ANA): NEGATIVE

## 2015-12-31 LAB — URIC ACID: Uric Acid, Serum: 4.7 mg/dL (ref 2.4–7.0)

## 2015-12-31 LAB — C-REACTIVE PROTEIN: CRP: 0.6 mg/dL — ABNORMAL HIGH (ref <0.60)

## 2015-12-31 LAB — RHEUMATOID FACTOR: Rhuematoid fact SerPl-aCnc: <10 IU/mL (ref <=14)

## 2016-01-04 ENCOUNTER — Ambulatory Visit (INDEPENDENT_AMBULATORY_CARE_PROVIDER_SITE_OTHER): Payer: BLUE CROSS/BLUE SHIELD | Admitting: Sports Medicine

## 2016-01-04 ENCOUNTER — Encounter: Payer: Self-pay | Admitting: Sports Medicine

## 2016-01-04 VITALS — BP 115/87 | HR 91 | Resp 16

## 2016-01-04 DIAGNOSIS — M722 Plantar fascial fibromatosis: Secondary | ICD-10-CM | POA: Diagnosis not present

## 2016-01-04 DIAGNOSIS — M79673 Pain in unspecified foot: Secondary | ICD-10-CM

## 2016-01-04 MED ORDER — TRIAMCINOLONE ACETONIDE 10 MG/ML IJ SUSP: 10.0000 mg | Freq: Once | INTRAMUSCULAR | Status: DC

## 2016-01-04 NOTE — Progress Notes (Signed)
Patient ID: BLAYRE PAPANIA, female   DOB: [birth year], 40 y.o.   MRN: [id number]  Subjective: Deanna Sparks is a 40 y.o. female patient returns to office for follow up eval of heel pain bilateral. Patient states that her heels still hurt; desires another injection and to go ahead and plan surgical intervention. No other issues noted.  Patient Active Problem List   Diagnosis Date Noted  . Preterm labor 04/03/2012   Current Outpatient Prescriptions on File Prior to Visit  Medication Sig Dispense Refill  . acetaminophen (TYLENOL) 325 MG tablet Take 2 tablets (650 mg total) by mouth every 6 (six) hours as needed. 30 tablet 0  . Cholecalciferol (VITAMIN D) 400 UNITS capsule Take 400 Units by mouth daily.    Marland Kitchen HYDROcodone-acetaminophen (NORCO) 5-325 MG per tablet Take 1 tablet by mouth every 4 (four) hours as needed for moderate pain. 20 tablet 0  . LARIN FE 1/20 1-20 MG-MCG tablet Take 1 tablet by mouth daily.     Marland Kitchen lisinopril-hydrochlorothiazide (PRINZIDE,ZESTORETIC) 20-12.5 MG per tablet Take 1 tablet by mouth daily.     . meloxicam (MOBIC) 15 MG tablet Take 1 tablet (15 mg total) by mouth daily. 30 tablet 0  . naproxen (NAPROSYN) 500 MG tablet Take 1 tablet (500 mg total) by mouth 2 (two) times daily. 30 tablet 0  . orphenadrine (NORFLEX) 100 MG tablet Take 1 tablet (100 mg total) by mouth 2 (two) times daily. 30 tablet 0   Current Facility-Administered Medications on File Prior to Visit  Medication Dose Route Frequency Provider Last Rate Last Dose  . triamcinolone acetonide (KENALOG) 10 MG/ML injection 10 mg  10 mg Other Once [employer], DPM      . triamcinolone acetonide (KENALOG) 10 MG/ML injection 10 mg  10 mg Other Once Landis Martins, DPM       Allergies  Allergen Reactions  . Codeine Itching  . Ultram [Tramadol] Itching   Past Surgical History  Procedure Laterality Date  . Cesarean section    .  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 Hypertension Mother   . Anesthesia problems Neg Hx   . Other Neg Hx    Social History   Social History  . Marital Status: Single    Spouse Name: N/A  . Number of Children: N/A  . Years of Education: N/A   Occupational History  . Not on file.   Social History Main Topics  . Smoking status: Never Smoker   . Smokeless tobacco: Not on file  . Alcohol Use: No  . Drug Use: No  . Sexual Activity: Yes    Birth Control/ Protection: Condom   Other Topics Concern  . Not on file   Social History Narrative    Objective: Physical Exam General: The patient is alert and oriented x3 in no acute distress.  Dermatology: Skin is warm, dry and supple bilateral lower extremities. Nails 1-10 are normal. There is no erythema, edema, no eccymosis, no open lesions present. Integument is otherwise unremarkable.  Vascular: Dorsalis Pedis pulse and Posterior Tibial pulse are 2/4 bilateral. Capillary fill time is immediate to all digits.  Neurological: Grossly intact to light touch with an achilles reflex of +2/5 and a  negative Tinel's sign bilateral.  Musculoskeletal: Tenderness to palpation at the medial calcaneal tubercale and through the insertion of the plantar fascia bilateral feet. No pain or instability to right ankle.  No pain with compression of calcaneus bilateral. No pain with tuning fork to calcaneus bilateral. No pain with calf compression bilateral. All joints range of motion within normal limits bilateral. Strength 5/5 in all groups bilateral.   Assessment and Plan: Problem List Items Addressed This Visit    None    Visit Diagnoses    Plantar fasciitis, bilateral    -  Primary    Relevant Medications    triamcinolone acetonide (KENALOG) 10 MG/ML injection 10 mg    Foot pain, unspecified laterality          -Complete examination performed. Discussed with patient in detail the condition of plantar fasciitis, how this occurs and general treatment options. Explained  both conservative and surgical treatments.  -Reviewed previous xrays -Reviewed arthritic panel; within normal limits  -After oral consent and aseptic prep, injected a mixture containing 1 ml of 2%  plain lidocaine, 1 ml 0.5% plain marcaine, 0.5 ml of kenalog 10 and 0.5 ml of dexamethasone phosphate into both heels for symptomatic relief. Post-injection care discussed with patient.  -Patient opt for surgical management. Consent obtain for EPF bilateral. Pre and Post op course explained. Risks, benefits, alternatives explained. No guarantees given or implied. Surgical booking slip submitted and provided patient with Surgical packet and info for [facility] -Dispensed surgical shoes to use post op -Recommended good supportive shoes and advised continued  use of inserts until time of surgery.  -Advised patient to cont with use of fascial brace/ night splint which she owns; will plan to use night splint post op -Cont daily stretching exercises. -Recommend patient to cont to ice affected area 1-2x daily. -Patient to return to office after surgery or sooner if issues arise. Advised time off from work during post op period to allow sufficient healing.   Landis Martins, DPM

## 2016-01-04 NOTE — Patient Instructions (Signed)
Pre-Operative Instructions  Congratulations, you have decided to take an important step to improving your quality of life.  You can be assured that the doctors of [facility] will be with you every step of the way.  1. Plan to be at the surgery center/hospital at least 1 (one) hour prior to your scheduled time unless otherwise directed by the surgical center/hospital staff.  You must have a responsible adult accompany you, remain during the surgery and drive you home.  Make sure you have directions to the surgical center/hospital and know how to get there on time. 2. For hospital based surgery you will need to obtain a history and physical form from your family physician within 1 month prior to the date of surgery- we will give you a form for you primary physician.  3. We make every effort to accommodate the date you request for surgery.  There are however, times where surgery dates or times have to be moved.  We will contact you as soon as possible if a change in schedule is required.   4. No Aspirin/Ibuprofen for one week before surgery.  If you are on aspirin, any non-steroidal anti-inflammatory medications (Mobic, Aleve, Ibuprofen) you should stop taking it 7 days prior to your surgery.  You make take Tylenol  For pain prior to surgery.  5. Medications- If you are taking daily heart and blood pressure medications, seizure, reflux, allergy, asthma, anxiety, pain or diabetes medications, make sure the surgery center/hospital is aware before the day of surgery so they may notify you which medications to take or avoid the day of surgery. 6. No food or drink after midnight the night before surgery unless directed otherwise by surgical center/hospital staff. 7. No alcoholic beverages 24 hours prior to surgery.  No smoking 24 hours prior to or 24 hours after surgery. 8. Wear loose pants or shorts- loose enough to fit over bandages, boots, and casts. 9. No slip on shoes, sneakers are best. 10. Bring  your boot with you to the surgery center/hospital.  Also bring crutches or a walker if your physician has prescribed it for you.  If you do not have this equipment, it will be provided for you after surgery. 11. If you have not been contracted by the surgery center/hospital by the day before your surgery, call to confirm the date and time of your surgery. 12. Leave-time from work may vary depending on the type of surgery you have.  Appropriate arrangements should be made prior to surgery with your employer. 13. Prescriptions will be provided immediately following surgery by your doctor.  Have these filled as soon as possible after surgery and take the medication as directed. 14. Remove nail polish on the operative foot. 15. Wash the night before surgery.  The night before surgery wash the foot and leg well with the antibacterial soap provided and water paying special attention to beneath the toenails and in between the toes.  Rinse thoroughly with water and dry well with a towel.  Perform this wash unless told not to do so by your physician.  Enclosed: 1 Ice pack (please put in freezer the night before surgery)   1 Hibiclens skin cleaner   Pre-op Instructions  If you have any questions regarding the instructions, do not hesitate to call our office.  [place]: [address] [place], [place] [address] [phone]  [place]: [address] [place], [place] [address] [phone]  [place]: [address]  [place], [place] [address] [phone]  Dr. Richard   Tuchman DPM, Dr. Ila Mcgill DPM Dr. Harriet Masson DPM, Dr. Lanelle Bal DPM, Dr. Landis Martins DPM

## 2016-01-07 LAB — HLA-B27 ANTIGEN: DNA RESULT: NEGATIVE

## 2016-01-24 DIAGNOSIS — M722 Plantar fascial fibromatosis: Secondary | ICD-10-CM

## 2016-02-10 DIAGNOSIS — M722 Plantar fascial fibromatosis: Secondary | ICD-10-CM

## 2016-02-14 ENCOUNTER — Encounter: Payer: Self-pay | Admitting: Sports Medicine

## 2016-02-14 DIAGNOSIS — M722 Plantar fascial fibromatosis: Secondary | ICD-10-CM | POA: Diagnosis not present

## 2016-02-15 ENCOUNTER — Telehealth: Payer: Self-pay | Admitting: Sports Medicine

## 2016-02-15 ENCOUNTER — Telehealth: Payer: Self-pay | Admitting: *Deleted

## 2016-02-15 DIAGNOSIS — Z9889 Other specified postprocedural states: Secondary | ICD-10-CM

## 2016-02-15 DIAGNOSIS — M79673 Pain in unspecified foot: Secondary | ICD-10-CM

## 2016-02-15 NOTE — Telephone Encounter (Signed)
Post op check phone call made to patient. Patient did not answer and no voicemail available to leave message -Dr. Cannon Kettle

## 2016-02-15 NOTE — Telephone Encounter (Addendum)
Post op courtesy call-Pt states she's doing okay.  I instructed pt to remain in the surgical boot at all times even to sleep, but could open boot to ice and rotate ankle gently if she were going to be awake and resting, not to be up on the foot more than 15mnutes/hour, keep dressing clean and dry until 1st POV and Dr. SCannon Kettlewould change.  I told pt to take her pain medication as directed and to make sure to call ahead or ask for refill of pain medication at appt, because someone would need to be sent to the office to pick up.  Pt states understanding.  02/29/2016-Referral to [facility] -Milus Glazierfaxed with pt's demographics.  03/13/2016-Nancy states she needs treatment records for pt from 03/10/2015 -08/2015.

## 2016-02-21 ENCOUNTER — Ambulatory Visit (INDEPENDENT_AMBULATORY_CARE_PROVIDER_SITE_OTHER): Payer: BLUE CROSS/BLUE SHIELD | Admitting: Sports Medicine

## 2016-02-21 ENCOUNTER — Encounter: Payer: Self-pay | Admitting: Sports Medicine

## 2016-02-21 ENCOUNTER — Ambulatory Visit (INDEPENDENT_AMBULATORY_CARE_PROVIDER_SITE_OTHER): Payer: BLUE CROSS/BLUE SHIELD

## 2016-02-21 DIAGNOSIS — M79673 Pain in unspecified foot: Secondary | ICD-10-CM

## 2016-02-21 DIAGNOSIS — Z9889 Other specified postprocedural states: Secondary | ICD-10-CM

## 2016-02-21 NOTE — Progress Notes (Signed)
Patient ID: Deanna Sparks, female   DOB: December 22, 1975, 40 y.o.   MRN: [id number] Subjective: Deanna Sparks is a 40 y.o. female patient seen today in office for POV #1 (DOS 02-14-16), S/P bilateral EPF. Patient admits to a little pain at surgical site, denies calf pain, denies headache, chest pain, shortness of breath, nausea, vomiting, fever, or chills. No other issues noted.   Patient Active Problem List   Diagnosis Date Noted  . Carpal tunnel syndrome 10/22/2015  . Preterm labor 04/03/2012    Current Outpatient Prescriptions on File Prior to Visit  Medication Sig Dispense Refill  . acetaminophen (TYLENOL) 325 MG tablet Take 2 tablets (650 mg total) by mouth every 6 (six) hours as needed. 30 tablet 0  . Cholecalciferol (VITAMIN D) 400 UNITS capsule Take 400 Units by mouth daily.    Marland Kitchen docusate sodium (COLACE) 100 MG capsule Take 100 mg by mouth 2 (two) times daily.    Marland Kitchen HYDROcodone-acetaminophen (NORCO) 5-325 MG per tablet Take 1 tablet by mouth every 4 (four) hours as needed for moderate pain. 20 tablet 0  . HYDROcodone-acetaminophen (NORCO) 7.5-325 MG tablet Take 1 tablet by mouth every 6 (six) hours as needed for moderate pain (take one tablet every 4-6 hours prn foot pain).    Marland Kitchen LARIN FE 1/20 1-20 MG-MCG tablet Take 1 tablet by mouth daily.     Marland Kitchen lisinopril-hydrochlorothiazide (PRINZIDE,ZESTORETIC) 20-12.5 MG per tablet Take 1 tablet by mouth daily.     . meloxicam (MOBIC) 15 MG tablet Take 1 tablet (15 mg total) by mouth daily. 30 tablet 0  . naproxen (NAPROSYN) 500 MG tablet Take 1 tablet (500 mg total) by mouth 2 (two) times daily. 30 tablet 0  . orphenadrine (NORFLEX) 100 MG tablet Take 1 tablet (100 mg total) by mouth 2 (two) times daily. 30 tablet 0  . promethazine (PHENERGAN) 25 MG tablet Take 25 mg by mouth every 6 (six) hours as needed for nausea or vomiting.     Current Facility-Administered Medications on File Prior to Visit  Medication Dose Route Frequency Provider Last Rate  Last Dose  . triamcinolone acetonide (KENALOG) 10 MG/ML injection 10 mg  10 mg Other Once [employer], DPM      . triamcinolone acetonide (KENALOG) 10 MG/ML injection 10 mg  10 mg Other Once [employer], DPM      . triamcinolone acetonide (KENALOG) 10 MG/ML injection 10 mg  10 mg Other Once Landis Martins, DPM        Allergies  Allergen Reactions  . Codeine Itching  . Ultram [Tramadol] Itching    Objective: There were no vitals filed for this visit.  General: No acute distress, AAOx3  Right and Left foot: Sutures intact with no gapping or dehiscence at surgical site, mild swelling to heels, no erythema, no warmth, no drainage, no signs of infection noted, Capillary fill time <3 seconds in all digits, gross sensation present via light touch to right and left foot. No pain or crepitation with range of motion right and left foot. Minimal tenderness to surgical sites.  No pain with calf compression.   Post Op Xray, Right and Left foot: Unchanged heel spur. No fracture/dislocation. Soft tissue swelling within normal limits for post op status.   Assessment and Plan:  Problem List Items Addressed This Visit    None    Visit Diagnoses    Foot pain, unspecified laterality    -  Primary    Relevant Orders  DG Foot Complete Right    DG Foot Complete Left    Status post bilateral foot surgery        EPF bilateral       -Patient seen and evaluated -Applied dry sterile dressing to surgical site right and left foot secured with ACE wrap and stockinet  -Advised patient to make sure to keep dressings clean, dry, and intact to left and right surgical site, removing the ACE as needed  -Advised patient to continue with post-op shoe on left and right foot; Encouraged patient to weight-bear to tolerance and to wean from wheelchair -Continue with PRN and Pain meds as needed   -Advised patient to ice and elevate as necessary  -Cont with no work (patient has taken off 6 weeks from work);  will determine return to work status at next visit -Will plan for suture removal at next office visit. In the meantime, patient to call office if any issues or problems arise.   Landis Martins, DPM

## 2016-02-28 ENCOUNTER — Encounter: Payer: Self-pay | Admitting: Sports Medicine

## 2016-02-28 ENCOUNTER — Ambulatory Visit (INDEPENDENT_AMBULATORY_CARE_PROVIDER_SITE_OTHER): Payer: BLUE CROSS/BLUE SHIELD | Admitting: Sports Medicine

## 2016-02-28 DIAGNOSIS — M79673 Pain in unspecified foot: Secondary | ICD-10-CM

## 2016-02-28 DIAGNOSIS — Z9889 Other specified postprocedural states: Secondary | ICD-10-CM

## 2016-02-28 MED ORDER — IBUPROFEN 800 MG PO TABS: 800.0000 mg | ORAL_TABLET | Freq: Every day | ORAL | Status: DC

## 2016-02-28 NOTE — Progress Notes (Signed)
Patient ID: Deanna Sparks, female   DOB: 1976-10-20, 40 y.o.   MRN: [id number]  Subjective: Deanna Sparks is a 40 y.o. female patient seen today in office for POV #2 (DOS 02-14-16), S/P bilateral EPF. Patient admits to a little pain at surgical site on yesterday but otherwise has not had any major pain, denies calf pain, denies headache, chest pain, shortness of breath, nausea, vomiting, fever, or chills. No other issues noted.   Patient Active Problem List   Diagnosis Date Noted  . Carpal tunnel syndrome 10/22/2015  . Preterm labor 04/03/2012    Current Outpatient Prescriptions on File Prior to Visit  Medication Sig Dispense Refill  . acetaminophen (TYLENOL) 325 MG tablet Take 2 tablets (650 mg total) by mouth every 6 (six) hours as needed. 30 tablet 0  . Cholecalciferol (VITAMIN D) 400 UNITS capsule Take 400 Units by mouth daily.    Marland Kitchen docusate sodium (COLACE) 100 MG capsule Take 100 mg by mouth 2 (two) times daily.    Marland Kitchen HYDROcodone-acetaminophen (NORCO) 5-325 MG per tablet Take 1 tablet by mouth every 4 (four) hours as needed for moderate pain. 20 tablet 0  . HYDROcodone-acetaminophen (NORCO) 7.5-325 MG tablet Take 1 tablet by mouth every 6 (six) hours as needed for moderate pain (take one tablet every 4-6 hours prn foot pain).    Marland Kitchen LARIN FE 1/20 1-20 MG-MCG tablet Take 1 tablet by mouth daily.     Marland Kitchen lisinopril-hydrochlorothiazide (PRINZIDE,ZESTORETIC) 20-12.5 MG per tablet Take 1 tablet by mouth daily.     . meloxicam (MOBIC) 15 MG tablet Take 1 tablet (15 mg total) by mouth daily. 30 tablet 0  . naproxen (NAPROSYN) 500 MG tablet Take 1 tablet (500 mg total) by mouth 2 (two) times daily. 30 tablet 0  . orphenadrine (NORFLEX) 100 MG tablet Take 1 tablet (100 mg total) by mouth 2 (two) times daily. 30 tablet 0  . promethazine (PHENERGAN) 25 MG tablet Take 25 mg by mouth every 6 (six) hours as needed for nausea or vomiting.     Current Facility-Administered Medications on File Prior to  Visit  Medication Dose Route Frequency Provider Last Rate Last Dose  . triamcinolone acetonide (KENALOG) 10 MG/ML injection 10 mg  10 mg Other Once [employer], DPM      . triamcinolone acetonide (KENALOG) 10 MG/ML injection 10 mg  10 mg Other Once [employer], DPM      . triamcinolone acetonide (KENALOG) 10 MG/ML injection 10 mg  10 mg Other Once Landis Martins, DPM        Allergies  Allergen Reactions  . Codeine Itching  . Ultram [Tramadol] Itching    Objective: There were no vitals filed for this visit.  General: No acute distress, AAOx3  Right and Left foot: Sutures intact with no gapping or dehiscence at surgical site, mild swelling to heels, no erythema, no warmth, no drainage, no signs of infection noted, Capillary fill time <3 seconds in all digits, gross sensation present via light touch to right and left foot. No pain or crepitation with range of motion right and left foot. Minimal tenderness to surgical sites.  No pain with calf compression.   Assessment and Plan:  Problem List Items Addressed This Visit    None    Visit Diagnoses    Status post bilateral foot surgery    -  Primary    Bilateral EPF    Relevant Medications    ibuprofen (ADVIL,MOTRIN) 800 MG tablet  Foot pain, unspecified laterality        Relevant Medications    ibuprofen (ADVIL,MOTRIN) 800 MG tablet       -Patient seen and evaluated -Sutures removed and applied steri-strips -Patient may transition to good supportive sneakers with inserts as tolerated -Continue with PRN and Pain meds as needed; Rx Motrin 867m tabs to take once daily as needed -Advised patient to ice and elevate as necessary  -Cont with no work (patient has taken off 6 weeks from work); will determine return to work status at next visit -Rx PT 2x/wk for 4 weeks with treatment modalities at [facility] -Patient to return to office in 1 month. In the meantime, patient to call office if any issues or problems arise.    TLandis Martins DPM

## 2016-02-29 NOTE — Telephone Encounter (Signed)
-----   Message from Landis Martins, [place] sent at 02/28/2016  3:40 PM EDT ----- Regarding: PT at [facility] Please set up PT at [facility] Eval and Treat S/p Bilateral EPF Gait, strengthening, stability, range of motion, flexibility, Other modalities as needed  Thanks  Dr. Cannon Kettle

## 2016-03-14 NOTE — Telephone Encounter (Signed)
Then I will fax over a blank sheet because we have no records for those dates.

## 2016-03-27 ENCOUNTER — Encounter: Payer: Self-pay | Admitting: Sports Medicine

## 2016-03-27 ENCOUNTER — Ambulatory Visit (INDEPENDENT_AMBULATORY_CARE_PROVIDER_SITE_OTHER): Payer: BLUE CROSS/BLUE SHIELD | Admitting: Sports Medicine

## 2016-03-27 DIAGNOSIS — Z9889 Other specified postprocedural states: Secondary | ICD-10-CM

## 2016-03-27 DIAGNOSIS — M79673 Pain in unspecified foot: Secondary | ICD-10-CM

## 2016-03-27 NOTE — Progress Notes (Signed)
Patient ID: Deanna Sparks, female   DOB: [birth year], 40 y.o.   MRN: [id number]  Subjective: Deanna Sparks is a 40 y.o. female patient seen today in office for POV #3 (DOS 02-14-16), S/P bilateral EPF. Patient denies pain at heels, denies calf pain, denies headache, chest pain, shortness of breath, nausea, vomiting, fever, or chills. Patient has completed PT with no problems. No other issues noted.   Patient Active Problem List   Diagnosis Date Noted  . Carpal tunnel syndrome 10/22/2015  . Preterm labor 04/03/2012    Current Outpatient Prescriptions on File Prior to Visit  Medication Sig Dispense Refill  . acetaminophen (TYLENOL) 325 MG tablet Take 2 tablets (650 mg total) by mouth every 6 (six) hours as needed. 30 tablet 0  . Cholecalciferol (VITAMIN D) 400 UNITS capsule Take 400 Units by mouth daily.    Marland Kitchen docusate sodium (COLACE) 100 MG capsule Take 100 mg by mouth 2 (two) times daily.    Marland Kitchen HYDROcodone-acetaminophen (NORCO) 5-325 MG per tablet Take 1 tablet by mouth every 4 (four) hours as needed for moderate pain. 20 tablet 0  . HYDROcodone-acetaminophen (NORCO) 7.5-325 MG tablet Take 1 tablet by mouth every 6 (six) hours as needed for moderate pain (take one tablet every 4-6 hours prn foot pain).    Marland Kitchen ibuprofen (ADVIL,MOTRIN) 800 MG tablet Take 1 tablet (800 mg total) by mouth daily. 30 tablet 0  . LARIN FE 1/20 1-20 MG-MCG tablet Take 1 tablet by mouth daily.     Marland Kitchen lisinopril-hydrochlorothiazide (PRINZIDE,ZESTORETIC) 20-12.5 MG per tablet Take 1 tablet by mouth daily.     . meloxicam (MOBIC) 15 MG tablet Take 1 tablet (15 mg total) by mouth daily. 30 tablet 0  . naproxen (NAPROSYN) 500 MG tablet Take 1 tablet (500 mg total) by mouth 2 (two) times daily. 30 tablet 0  . orphenadrine (NORFLEX) 100 MG tablet Take 1 tablet (100 mg total) by mouth 2 (two) times daily. 30 tablet 0  . promethazine (PHENERGAN) 25 MG tablet Take 25 mg by mouth every 6 (six) hours as needed for nausea or  vomiting.     Current Facility-Administered Medications on File Prior to Visit  Medication Dose Route Frequency Provider Last Rate Last Dose  . triamcinolone acetonide (KENALOG) 10 MG/ML injection 10 mg  10 mg Other Once [employer], DPM      . triamcinolone acetonide (KENALOG) 10 MG/ML injection 10 mg  10 mg Other Once [employer], DPM      . triamcinolone acetonide (KENALOG) 10 MG/ML injection 10 mg  10 mg Other Once Landis Martins, DPM        Allergies  Allergen Reactions  . Codeine Itching  . Ultram [Tramadol] Itching    Objective: There were no vitals filed for this visit.  General: No acute distress, AAOx3  Right and Left foot: Incisions well healed with no gapping or dehiscence at surgical sites, improved swelling to heels, no erythema, no warmth, no drainage, no signs of infection noted, Capillary fill time <3 seconds in all digits, gross sensation present via light touch to right and left foot. No pain or crepitation with range of motion right and left foot. No tenderness to surgical sites.  No pain with calf compression.   Assessment and Plan:  Problem List Items Addressed This Visit    None    Visit Diagnoses    Status post bilateral foot surgery    -  Primary    Foot pain, unspecified  laterality           -Patient seen and evaluated -PT completed -Recommend scar cream or gels to incision sites -Patient to continue with good supportive sneakers with inserts  -Continue with PRN and Pain meds as needed -Advised patient to ice and elevate as necessary  -Return to work with no restrictions  -Patient to return to office in 2 months. In the meantime, patient to call office if any issues or problems arise.   Landis Martins, DPM

## 2016-05-22 ENCOUNTER — Ambulatory Visit: Payer: BLUE CROSS/BLUE SHIELD | Admitting: Sports Medicine

## 2016-11-04 ENCOUNTER — Encounter (HOSPITAL_COMMUNITY): Payer: Self-pay | Admitting: Emergency Medicine

## 2016-11-04 ENCOUNTER — Ambulatory Visit (HOSPITAL_COMMUNITY)
Admission: EM | Admit: 2016-11-04 | Discharge: 2016-11-04 | Disposition: A | Discharge status: Home / Self Care | Payer: BLUE CROSS/BLUE SHIELD | Attending: Internal Medicine | Admitting: Internal Medicine

## 2016-11-04 DIAGNOSIS — J209 Acute bronchitis, unspecified: Secondary | ICD-10-CM

## 2016-11-04 MED ORDER — AZITHROMYCIN 250 MG PO TABS: 250.0000 mg | ORAL_TABLET | Freq: Every day | ORAL | 0 refills | Status: DC

## 2016-11-04 MED ORDER — BENZONATATE 100 MG PO CAPS: 100.0000 mg | ORAL_CAPSULE | Freq: Three times a day (TID) | ORAL | 0 refills | Status: DC

## 2016-11-04 MED ORDER — PREDNISONE 10 MG (21) PO TBPK: ORAL_TABLET | ORAL | 0 refills | Status: DC

## 2016-11-04 NOTE — Discharge Instructions (Signed)
You most likely have acute bronchitis, I advise rest, plenty of fluids and management of symptoms with over the counter medicines. For symptoms you may take Tylenol as needed every 4-6 hours for body aches or fever, not to exceed 4,000 mg a day, Take mucinex or mucinex DM ever 12 hours with a full glass of water, you may use an inhaled steroid such as Flonase, 2 sprays each nostril once a day for congestion, or an antihistamine such as Claritin or Zyrtec once a day. To treat the bronchitis, I have prescribed Azithromycin, take 2 tablets today, then 1 daily till finished, prednisone taper, take 6 tablets today, then decrease by 1 each day till finished, and tessalon for cough, take 1 tablet 3 times a day as needed.  Should your symptoms worsen or fail to resolve, follow up with your primary care provider or return to clinic.

## 2016-11-04 NOTE — ED Provider Notes (Signed)
CSN: [id number]     Arrival date & time 11/04/16  1524 History   First MD Initiated Contact with Patient 11/04/16 1652     Chief Complaint  Patient presents with  . Cough   (Consider location/radiation/quality/duration/timing/severity/associated sxs/prior Treatment) Deanna Sparks presents with 8 day history of cough, cough is worse at night, described as hacking, with green sputum. She has tried OTC cough medicine without relief. She denies congestion, fever, muscle aches, body aches, nausea, vomiting, or diarrhea. She does not smoke, denies history or asthma, COPD, or emphasema.    Cough    Past Medical History:  Diagnosis Date  . Genital herpes    last outbreak 12/2011  . Hypertension    Past Surgical History:  Procedure Laterality Date  .  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 CESAREAN SECTION     Family History  Problem Relation Age of Onset  . Hypertension Mother   . Anesthesia problems Neg Hx   . Other Neg Hx    Social History  Substance Use Topics  . Smoking status: Never Smoker  . Smokeless tobacco: Not on file  . Alcohol use No   OB History    Gravida Para Term Preterm AB Living   4 3 2 1  0 3   SAB TAB Ectopic Multiple Live Births   0 0 0 0 1     Review of Systems  Reason unable to perform ROS: as covered in HPI.  Respiratory: Positive for cough.   All other systems reviewed and are negative.   Allergies  Codeine and Ultram [tramadol]  Home Medications   Prior to Admission medications   Medication Sig Start Date End Date Taking? Authorizing Provider  lisinopril-hydrochlorothiazide (PRINZIDE,ZESTORETIC) 20-12.5 MG per tablet Take 1 tablet by mouth daily.  01/06/14  Yes Historical Provider, MD  azithromycin (ZITHROMAX) 250 MG tablet Take 1 tablet (250 mg total) by mouth daily. Take first 2 tablets together, then 1 every day until finished. 11/04/16   Barnet Glasgow, NP  benzonatate (TESSALON) 100 MG capsule Take 1 capsule (100 mg total) by mouth  every 8 (eight) hours. 11/04/16   Barnet Glasgow, NP  docusate sodium (COLACE) 100 MG capsule Take 100 mg by mouth 2 (two) times daily.    Landis Martins, DPM  predniSONE (STERAPRED UNI-PAK 21 TAB) 10 MG (21) TBPK tablet Take 6 tablets now, decrease by 1 each day till finished (7,4,1,6,3,8) 11/04/16   Barnet Glasgow, NP   Meds Ordered and Administered this Visit  Medications - No data to display  BP 126/77 (BP Location: Left Arm)   Pulse 83   Temp 98.3 F (36.8 C) (Oral)   Resp 18   SpO2 100%  No data found.   Physical Exam  Constitutional: She is oriented to person, place, and time. She appears well-nourished. No distress.  HENT:  Head: Normocephalic.  Right Ear: Tympanic membrane and external ear normal.  Left Ear: Tympanic membrane and external ear normal.  Nose: Nose normal.  Mouth/Throat: Oropharynx is clear and moist. No oropharyngeal exudate.  Neck: No JVD present.  Cardiovascular: Normal rate and regular rhythm.   Pulmonary/Chest: Effort normal. No respiratory distress. She has no wheezes. She has rhonchi in the right middle field and the left middle field. She has no rales.  Abdominal: Soft. Bowel sounds are normal.  Lymphadenopathy:    She has no cervical adenopathy.  Neurological: She is alert and oriented to person, place, and time.  Skin:  Skin is warm. Capillary refill takes less than 2 seconds. She is not diaphoretic.  Psychiatric: She has a normal mood and affect.  Nursing note and vitals reviewed.   Urgent Care Course   Clinical Course     Procedures (including critical care time)  Labs Review Labs Reviewed - No data to display  Imaging Review No results found.   Visual Acuity Review  Right Eye Distance:   Left Eye Distance:   Bilateral Distance:    Right Eye Near:   Left Eye Near:    Bilateral Near:         MDM   1. Acute bronchitis, unspecified organism   You most likely have acute bronchitis, I advise rest, plenty of fluids and  management of symptoms with over the counter medicines. For symptoms you may take Tylenol as needed every 4-6 hours for body aches or fever, not to exceed 4,000 mg a day, Take mucinex or mucinex DM ever 12 hours with a full glass of water, you may use an inhaled steroid such as Flonase, 2 sprays each nostril once a day for congestion, or an antihistamine such as Claritin or Zyrtec once a day. To treat the bronchitis, I have prescribed Azithromycin, take 2 tablets today, then 1 daily till finished, prednisone taper, take 6 tablets today, then decrease by 1 each day till finished, and tessalon for cough, take 1 tablet 3 times a day as needed.  Should your symptoms worsen or fail to resolve, follow up with your primary care provider or return to clinic.      Barnet Glasgow, NP 11/04/16 [phone]

## 2016-11-04 NOTE — ED Triage Notes (Signed)
The patient presented to the [facility] with a  Complaint of a cough for 8 days.

## 2017-01-14 ENCOUNTER — Encounter (HOSPITAL_COMMUNITY): Payer: Self-pay | Admitting: *Deleted

## 2017-01-14 ENCOUNTER — Ambulatory Visit (HOSPITAL_COMMUNITY)
Admission: EM | Admit: 2017-01-14 | Discharge: 2017-01-14 | Disposition: A | Discharge status: Home / Self Care | Payer: BLUE CROSS/BLUE SHIELD | Attending: Family Medicine | Admitting: Family Medicine

## 2017-01-14 DIAGNOSIS — J029 Acute pharyngitis, unspecified: Secondary | ICD-10-CM

## 2017-01-14 DIAGNOSIS — Z888 Allergy status to other drugs, medicaments and biological substances status: Secondary | ICD-10-CM | POA: Insufficient documentation

## 2017-01-14 DIAGNOSIS — Z79899 Other long term (current) drug therapy: Secondary | ICD-10-CM | POA: Diagnosis not present

## 2017-01-14 DIAGNOSIS — Z885 Allergy status to narcotic agent status: Secondary | ICD-10-CM | POA: Insufficient documentation

## 2017-01-14 DIAGNOSIS — I10 Essential (primary) hypertension: Secondary | ICD-10-CM | POA: Insufficient documentation

## 2017-01-14 DIAGNOSIS — J302 Other seasonal allergic rhinitis: Secondary | ICD-10-CM | POA: Diagnosis not present

## 2017-01-14 DIAGNOSIS — J301 Allergic rhinitis due to pollen: Secondary | ICD-10-CM

## 2017-01-14 DIAGNOSIS — Z8249 Family history of ischemic heart disease and other diseases of the circulatory system: Secondary | ICD-10-CM | POA: Diagnosis not present

## 2017-01-14 LAB — POCT RAPID STREP A: Streptococcus, Group A Screen (Direct): NEGATIVE

## 2017-01-14 NOTE — ED Provider Notes (Signed)
CSN: [id number]     Arrival date & time 01/14/17  1925 History   First MD Initiated Contact with Patient 01/14/17 2002     Chief Complaint  Patient presents with  . Sore Throat   (Consider location/radiation/quality/duration/timing/severity/associated sxs/prior Treatment) 41 year old female complaining of a sore throat over one week. She also has upper respiratory congestion, nasal congestion, sniffles and runny nose. Sometimes her eyes are watery and burning. Denies fever or chills. No chest pain, shortness of breath      Past Medical History:  Diagnosis Date  . Genital herpes    last outbreak 12/2011  . Hypertension    Past Surgical History:  Procedure Laterality Date  .  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 CESAREAN SECTION     Family History  Problem Relation Age of Onset  . Hypertension Mother   . Anesthesia problems Neg Hx   . Other Neg Hx    Social History  Substance Use Topics  . Smoking status: Never Smoker  . Smokeless tobacco: Not on file  . Alcohol use No   OB History    Gravida Para Term Preterm AB Living   4 3 2 1  0 3   SAB TAB Ectopic Multiple Live Births   0 0 0 0 1     Review of Systems  Constitutional: Positive for activity change. Negative for fever.  HENT: Positive for congestion, postnasal drip, rhinorrhea and sore throat.   Eyes: Positive for discharge and itching.  Respiratory: Negative.   Gastrointestinal: Negative.   All other systems reviewed and are negative.   Allergies  Codeine and Ultram [tramadol]  Home Medications   Prior to Admission medications   Medication Sig Start Date End Date Taking? Authorizing Provider  lisinopril-hydrochlorothiazide (PRINZIDE,ZESTORETIC) 20-12.5 MG per tablet Take 1 tablet by mouth daily.  01/06/14  Yes Historical Provider, MD  predniSONE (STERAPRED UNI-PAK 21 TAB) 10 MG (21) TBPK tablet Take 6 tablets now, decrease by 1 each day till finished (4,0,9,8,1,1) 11/04/16   Barnet Glasgow, NP   Meds  Ordered and Administered this Visit  Medications - No data to display  BP (!) 150/93   Pulse 80   Temp 99.2 F (37.3 C) (Oral)   Resp (!) 146   LMP 01/08/2017 (Approximate)   SpO2 98%  No data found.   Physical Exam  Constitutional: She is oriented to person, place, and time. She appears well-developed and well-nourished. No distress.  HENT:  Bilateral TMs are normal. Oropharynx with borderline erythema. Mostly clear and moist with moderate amount of clear PND. No exudates or swelling.  Eyes: EOM are normal.  Neck: Normal range of motion. Neck supple.  Cardiovascular: Normal rate, regular rhythm, normal heart sounds and intact distal pulses.   Pulmonary/Chest: Effort normal and breath sounds normal. No respiratory distress. She has no wheezes.  Lymphadenopathy:    She has no cervical adenopathy.  Neurological: She is alert and oriented to person, place, and time.  Skin: Skin is warm and dry.  Nursing note and vitals reviewed.   Urgent Care Course     Procedures (including critical care time)  Labs Review Labs Reviewed  POCT RAPID STREP A    Imaging Review No results found.   Visual Acuity Review  Right Eye Distance:   Left Eye Distance:   Bilateral Distance:    Right Eye Near:   Left Eye Near:    Bilateral Near:         MDM  1. Sore throat   2. Acute seasonal allergic rhinitis due to pollen    Your symptoms are most likely due to allergies. Below are a list of medications that she continues to help with allergies. These do not cure allergies but they help with symptoms. Sudafed PE 10 mg every 4 to 6 hours as needed for congestion Allegra or Zyrtec daily as needed for drainage and runny nose. For stronger antihistamine may take Chlor-Trimeton 2 to 4 mg every 4 to 6 hours, may cause drowsiness. Saline nasal spray used frequently. Ibuprofen 600 mg every 6 hours as needed for pain, discomfort or fever. Drink plenty of fluids and stay  well-hydrated. Flonase or Rhinocort nasal spray daily    Janne Napoleon, NP 01/14/17 2020

## 2017-01-14 NOTE — Discharge Instructions (Signed)
Your symptoms are most likely due to allergies. Below are a list of medications that she continues to help with allergies. These do not cure allergies but they help with symptoms. Sudafed PE 10 mg every 4 to 6 hours as needed for congestion Allegra or Zyrtec daily as needed for drainage and runny nose. For stronger antihistamine may take Chlor-Trimeton 2 to 4 mg every 4 to 6 hours, may cause drowsiness. Saline nasal spray used frequently. Ibuprofen 600 mg every 6 hours as needed for pain, discomfort or fever. Drink plenty of fluids and stay well-hydrated. Flonase or Rhinocort nasal spray daily

## 2017-01-14 NOTE — ED Triage Notes (Signed)
C/O sore throat x 1 wk with progressive worsening without fevers.  C/O nasal congestion.

## 2017-01-17 LAB — CULTURE, GROUP A STREP (THRC)

## 2017-03-02 ENCOUNTER — Encounter: Payer: Self-pay | Admitting: Podiatry

## 2017-03-02 ENCOUNTER — Ambulatory Visit (INDEPENDENT_AMBULATORY_CARE_PROVIDER_SITE_OTHER): Payer: BLUE CROSS/BLUE SHIELD

## 2017-03-02 ENCOUNTER — Ambulatory Visit (INDEPENDENT_AMBULATORY_CARE_PROVIDER_SITE_OTHER): Payer: BLUE CROSS/BLUE SHIELD | Admitting: Podiatry

## 2017-03-02 VITALS — BP 168/104 | HR 88

## 2017-03-02 DIAGNOSIS — M25579 Pain in unspecified ankle and joints of unspecified foot: Secondary | ICD-10-CM

## 2017-03-02 DIAGNOSIS — R52 Pain, unspecified: Secondary | ICD-10-CM

## 2017-03-02 DIAGNOSIS — M722 Plantar fascial fibromatosis: Secondary | ICD-10-CM

## 2017-03-02 MED ORDER — TRIAMCINOLONE ACETONIDE 10 MG/ML IJ SUSP
10.0000 mg | Freq: Once | INTRAMUSCULAR | Status: AC
  Administered 2017-03-02: 10 mg

## 2017-03-04 NOTE — Progress Notes (Signed)
Subjective:    Patient ID: Deanna Sparks, female   DOB: 41 y.o.   MRN: [id number]   HPI patient had fascial release last year and states that her heels are still really bothering her and it feels somewhat different than before but it still very painful    ROS      Objective:  Physical Exam Neurovascular status intact with continued discomfort around the medial and central band of the fascia at its insertion    Assessment:    Frustrating case with no indications of improvement with previous fascial release     Plan:   Injected the plantar fascia bilateral 3 mg Kenalog 5 mg Xylocaine and referred back to Dr. Cannon Kettle to reevaluate this

## 2017-04-03 ENCOUNTER — Ambulatory Visit: Payer: BLUE CROSS/BLUE SHIELD | Admitting: Sports Medicine

## 2018-01-21 ENCOUNTER — Ambulatory Visit (HOSPITAL_COMMUNITY)
Admission: EM | Admit: 2018-01-21 | Discharge: 2018-01-21 | Disposition: A | Discharge status: Home / Self Care | Payer: BLUE CROSS/BLUE SHIELD | Attending: Urgent Care | Admitting: Urgent Care

## 2018-01-21 ENCOUNTER — Encounter (HOSPITAL_COMMUNITY): Payer: Self-pay | Admitting: Emergency Medicine

## 2018-01-21 ENCOUNTER — Other Ambulatory Visit: Payer: Self-pay

## 2018-01-21 DIAGNOSIS — M546 Pain in thoracic spine: Secondary | ICD-10-CM

## 2018-01-21 DIAGNOSIS — R109 Unspecified abdominal pain: Secondary | ICD-10-CM

## 2018-01-21 MED ORDER — CYCLOBENZAPRINE HCL 5 MG PO TABS: 5.0000 mg | ORAL_TABLET | Freq: Three times a day (TID) | ORAL | 0 refills | Status: DC | PRN

## 2018-01-21 MED ORDER — CELECOXIB 100 MG PO CAPS: 100.0000 mg | ORAL_CAPSULE | Freq: Two times a day (BID) | ORAL | 0 refills | Status: DC

## 2018-01-21 NOTE — ED Triage Notes (Signed)
Patient complains of back pain, no known injury.  Bent over yesterday and felt a cramp, but pain has spread to under breast and abdomen.  Pain initiated in mid-back

## 2018-01-21 NOTE — ED Provider Notes (Signed)
  MRN: [id number] DOB: 1976-05-04  Subjective:   Deanna Sparks is a 42 y.o. female presenting for 1 day history right sided back pain that is now over right flank and over diaphragm anteriorly. Has had associated nausea. Denies fever, vomiting, cough, chest pain, dysuria, urinary frequency, hematuria, constipation, falls, trauma. Had soreness throughout the week. Worked out yesterday with squats. Has tried muscle rub, water soaks, ibuprofen. Hydrates with ~64 ounces of water daily. Admits she has an overactive bladder. Denies smoking cigarettes or drinking alcohol.   No current facility-administered medications for this encounter.   Current Outpatient Medications:  .  lisinopril-hydrochlorothiazide (PRINZIDE,ZESTORETIC) 20-12.5 MG per tablet, Take 1 tablet by mouth daily. , Disp: , Rfl:  .  predniSONE (STERAPRED UNI-PAK 21 TAB) 10 MG (21) TBPK tablet, Take 6 tablets now, decrease by 1 each day till finished (6,5,4,3,2,1), Disp: 21 tablet, Rfl: 0   Allergies  Allergen Reactions  . Codeine Itching  . Ultram [Tramadol] Itching    Past Medical History:  Diagnosis Date  . Genital herpes    last outbreak 12/2011  . Hypertension      Past Surgical History:  Procedure Laterality Date  .  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 CESAREAN SECTION      Objective:   Vitals: BP 136/86 (BP Location: Left Arm)   Pulse 69   Temp 98.3 F (36.8 C) (Oral)   Resp 18   SpO2 100%   Physical Exam  Constitutional: She is oriented to person, place, and time. She appears well-developed and well-nourished.  HENT:  Mouth/Throat: Oropharynx is clear and moist.  Eyes: No scleral icterus.  Cardiovascular: Normal rate, regular rhythm and intact distal pulses. Exam reveals no gallop and no friction rub.  No murmur heard. Pulmonary/Chest: No respiratory distress. She has no wheezes. She has no rales.  Abdominal: Soft. Bowel sounds are normal. She exhibits no distension and no mass. There is no tenderness.  There is no guarding.  No CVA tenderness.  Musculoskeletal:       Thoracic back: She exhibits tenderness (over area depicted). She exhibits normal range of motion, no bony tenderness, no swelling, no edema, no deformity, no laceration and no spasm.       Back:  Neurological: She is alert and oriented to person, place, and time.  Skin: Skin is warm and dry.  Psychiatric: She has a normal mood and affect.   Assessment and Plan :   Right flank pain  Acute right-sided thoracic back pain  Pain is likely musculoskeletal in etiology related to her work outs. Recommended conservative management with Celebrex, Flexeril, hydration, rest. Return-to-clinic precautions discussed, patient verbalized understanding.    Jaynee Eagles, [place] 01/21/18 1334

## 2018-02-22 ENCOUNTER — Encounter (HOSPITAL_COMMUNITY): Payer: Self-pay

## 2018-02-22 ENCOUNTER — Ambulatory Visit (HOSPITAL_COMMUNITY)
Admission: EM | Admit: 2018-02-22 | Discharge: 2018-02-22 | Disposition: A | Discharge status: Home / Self Care | Payer: BLUE CROSS/BLUE SHIELD | Attending: Urgent Care | Admitting: Urgent Care

## 2018-02-22 ENCOUNTER — Other Ambulatory Visit: Payer: Self-pay

## 2018-02-22 DIAGNOSIS — B9789 Other viral agents as the cause of diseases classified elsewhere: Secondary | ICD-10-CM

## 2018-02-22 DIAGNOSIS — J069 Acute upper respiratory infection, unspecified: Secondary | ICD-10-CM | POA: Insufficient documentation

## 2018-02-22 DIAGNOSIS — J029 Acute pharyngitis, unspecified: Secondary | ICD-10-CM | POA: Diagnosis not present

## 2018-02-22 DIAGNOSIS — Z885 Allergy status to narcotic agent status: Secondary | ICD-10-CM | POA: Diagnosis not present

## 2018-02-22 DIAGNOSIS — R52 Pain, unspecified: Secondary | ICD-10-CM

## 2018-02-22 DIAGNOSIS — I10 Essential (primary) hypertension: Secondary | ICD-10-CM | POA: Insufficient documentation

## 2018-02-22 DIAGNOSIS — Z888 Allergy status to other drugs, medicaments and biological substances status: Secondary | ICD-10-CM | POA: Insufficient documentation

## 2018-02-22 DIAGNOSIS — R51 Headache: Secondary | ICD-10-CM

## 2018-02-22 DIAGNOSIS — R519 Headache, unspecified: Secondary | ICD-10-CM

## 2018-02-22 LAB — POCT RAPID STREP A: Streptococcus, Group A Screen (Direct): NEGATIVE

## 2018-02-22 MED ORDER — PREDNISONE 20 MG PO TABS: ORAL_TABLET | ORAL | 0 refills | Status: DC

## 2018-02-22 MED ORDER — BENZONATATE 100 MG PO CAPS: 100.0000 mg | ORAL_CAPSULE | Freq: Three times a day (TID) | ORAL | 0 refills | Status: DC | PRN

## 2018-02-22 NOTE — ED Triage Notes (Signed)
Pt presents with sore throat, cough, body aches and headache since Sunday.

## 2018-02-22 NOTE — ED Provider Notes (Signed)
  MRN: [id number] DOB: 06-Feb-1976  Subjective:   Deanna Sparks is a 42 y.o. female presenting for 5-day history of sore throat, productive cough, intermittent headaches, sinus congestion, bilateral ear fullness, fatigue.  Denies fever, sinus pain, ear pain, ear drainage, chest pain, shortness of breath, wheezing, nausea, vomiting, belly pain, rashes.  Patient has a history of high blood pressure and manage this lisinopril and hydrochlorothiazide, admits trouble with compliance.  Denies smoking cigarettes.  Denies history of allergies and asthma.  No current facility-administered medications for this encounter.   Current Outpatient Medications:  .  lisinopril-hydrochlorothiazide (PRINZIDE,ZESTORETIC) 20-12.5 MG per tablet, Take 1 tablet by mouth daily. , Disp: , Rfl:     Allergies  Allergen Reactions  . Codeine Itching  . Ultram [Tramadol] Itching    Past Medical History:  Diagnosis Date  . Genital herpes    last outbreak 12/2011  . Hypertension      Past Surgical History:  Procedure Laterality Date  .  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 CESAREAN SECTION      Objective:   Vitals: BP 134/84   Pulse 75   Temp 99 F (37.2 C) (Oral)   Resp 16   Wt 226 lb (102.5 kg)   LMP 02/05/2018   SpO2 100%   BMI 34.36 kg/m   Physical Exam  Constitutional: She is oriented to person, place, and time. She appears well-developed and well-nourished.  HENT:  Right Ear: Tympanic membrane normal.  Left Ear: Tympanic membrane normal.  Nose: Mucosal edema and rhinorrhea present. No sinus tenderness.  Mouth/Throat: Mucous membranes are dry. Posterior oropharyngeal erythema present. No oropharyngeal exudate.  Eyes: Right eye exhibits no discharge. Left eye exhibits no discharge. No scleral icterus.  Neck: Normal range of motion. Neck supple.  Cardiovascular: Normal rate, regular rhythm and intact distal pulses. Exam reveals no gallop and no friction rub.  No murmur heard. Pulmonary/Chest:  No respiratory distress. She has no wheezes. She has no rales.  Lymphadenopathy:    She has no cervical adenopathy.  Neurological: She is alert and oriented to person, place, and time.  Skin: Skin is warm and dry. No rash noted.  Psychiatric: She has a normal mood and affect.    Results for orders placed or performed during the hospital encounter of 02/22/18 (from the past 24 hour(s))  POCT rapid strep A [facility])     Status: None   Collection Time: 02/22/18  6:34 PM  Result Value Ref Range   Streptococcus, Group A Screen (Direct) NEGATIVE NEGATIVE     Assessment and Plan :   Sore throat  Viral URI with cough  Generalized headaches  Body aches  Essential hypertension  Physical exam findings reassuring.  Will start supportive care for viral type illness.  Recommend patient hydrate much better.  She did request aggressive treatment for her symptoms.  I offered her prednisone course for all of her sinus inflammation and to help with her cough especially since patient has multiple allergies to codeine, Ultram and is unable to take cough syrup as a result. Counseled patient on potential for adverse effects with medications prescribed today, patient verbalized understanding. Return-to-clinic precautions discussed, patient verbalized understanding.    Jaynee Eagles, PA-C 02/22/18 1920

## 2018-02-22 NOTE — ED Notes (Signed)
Patient discharged by provider.

## 2018-02-22 NOTE — Discharge Instructions (Addendum)
Hydrate well with at least 2 liters (1 gallon) of water daily. For sore throat try using a honey-based tea. Use 3 teaspoons of honey with juice squeezed from half lemon. Place shaved pieces of ginger into 1/2-1 cup of water and warm over stove top. Then mix the ingredients and repeat every 4 hours as needed.

## 2018-02-25 LAB — CULTURE, GROUP A STREP (THRC)

## 2018-08-09 ENCOUNTER — Other Ambulatory Visit: Payer: Self-pay | Admitting: Internal Medicine

## 2018-08-21 ENCOUNTER — Encounter: Payer: BLUE CROSS/BLUE SHIELD | Admitting: Internal Medicine

## 2018-09-09 ENCOUNTER — Other Ambulatory Visit: Payer: Self-pay | Admitting: Internal Medicine

## 2018-09-09 MED ORDER — LISINOPRIL-HYDROCHLOROTHIAZIDE 20-12.5 MG PO TABS: 1.0000 | ORAL_TABLET | Freq: Every day | ORAL | 0 refills | Status: DC

## 2018-09-12 ENCOUNTER — Encounter: Payer: Medicaid Other | Admitting: Internal Medicine

## 2018-10-10 ENCOUNTER — Ambulatory Visit (INDEPENDENT_AMBULATORY_CARE_PROVIDER_SITE_OTHER): Payer: BLUE CROSS/BLUE SHIELD | Admitting: Internal Medicine

## 2018-10-10 ENCOUNTER — Encounter: Payer: Self-pay | Admitting: Internal Medicine

## 2018-10-10 VITALS — BP 120/78 | HR 84 | Temp 98.1°F | Ht 67.0 in | Wt 216.2 lb

## 2018-10-10 DIAGNOSIS — I10 Essential (primary) hypertension: Secondary | ICD-10-CM | POA: Diagnosis not present

## 2018-10-10 DIAGNOSIS — E669 Obesity, unspecified: Secondary | ICD-10-CM | POA: Diagnosis not present

## 2018-10-10 DIAGNOSIS — Z131 Encounter for screening for diabetes mellitus: Secondary | ICD-10-CM

## 2018-10-10 DIAGNOSIS — Z0001 Encounter for general adult medical examination with abnormal findings: Secondary | ICD-10-CM | POA: Diagnosis not present

## 2018-10-10 DIAGNOSIS — E049 Nontoxic goiter, unspecified: Secondary | ICD-10-CM | POA: Diagnosis not present

## 2018-10-10 DIAGNOSIS — M722 Plantar fascial fibromatosis: Secondary | ICD-10-CM | POA: Insufficient documentation

## 2018-10-10 DIAGNOSIS — E66811 Obesity, class 1: Secondary | ICD-10-CM

## 2018-10-10 DIAGNOSIS — Z6833 Body mass index (BMI) 33.0-33.9, adult: Secondary | ICD-10-CM

## 2018-10-10 LAB — POCT UA - MICROALBUMIN
Albumin/Creatinine Ratio, Urine, POC: 30
Creatinine, POC: 300 mg/dL
MICROALBUMIN (UR) POC: 30 mg/L

## 2018-10-10 LAB — POCT URINALYSIS DIPSTICK
BILIRUBIN UA: NEGATIVE
GLUCOSE UA: NEGATIVE
Ketones, UA: NEGATIVE
Leukocytes, UA: NEGATIVE
Nitrite, UA: NEGATIVE
Protein, UA: NEGATIVE
Spec Grav, UA: >=1.03 — AB (ref 1.010–1.025)
Urobilinogen, UA: 0.2 E.U./dL
pH, UA: 5.5 (ref 5.0–8.0)

## 2018-10-10 NOTE — Patient Instructions (Signed)
Ones your ultrasounds gets approved the radiology department will call you to schedule it. We will call you when we get the results.     Goiter  A goiter is an enlarged thyroid gland. The thyroid is located in the lower front of the neck. It makes hormones that affect many body parts and systems, including the system that affects how quickly the body burns fuel for energy (metabolism). Most goiters are painless and are not a cause for concern. Some goiters can affect the way your thyroid makes thyroid hormones. Goiters and conditions that cause goiters can be treated, if necessary. What are the causes? Common causes of this condition include:  Lack (deficiency) of a mineral called iodine. The thyroid gland uses iodine to make thyroid hormones.  Diseases that attack healthy cells in the body (autoimmune diseases) and affect thyroid function, such as Graves' disease or Hashimoto's disease. These diseases may cause the body to produce too much thyroid hormone (hyperthyroidism) or too little of the hormone (hypothyroidism).  Conditions that cause inflammation of the thyroid (thyroiditis).  One or more small growths on the thyroid (nodular goiter). Other causes include:  Medical problems caused by abnormal genes that are passed from parent to child (genetic defects).  Thyroid injury or infection.  Tumors that may or may not be cancerous.  Pregnancy.  Certain medicines.  Exposure to radiation. In some cases, the cause may not be known. What increases the risk? This condition is more likely to develop in:  People who do not get enough iodine in their diet.  People who have a family history of goiter.  Women.  People who are older than age 7.  People who smoke tobacco.  People who have had exposure to radiation. What are the signs or symptoms? The main symptom of this condition is swelling in the lower, front part of the neck. This swelling can range from a very small bump  to a large lump. Other symptoms may include:  A tight feeling in the throat.  A hoarse voice.  Coughing.  Wheezing.  Difficulty swallowing or breathing.  Bulging veins in the neck.  Dizziness. When a goiter is the result of an overactive thyroid (hyperthyroidism), symptoms may also include:  Nervousness or restlessness.  Inability to tolerate heat.  Unexplained weight loss.  Diarrhea.  Change in the texture of hair or skin.  Changes in heartbeat, such as skipped beats, extra beats, or a rapid heart rate.  Loss of menstruation.  Shaky hands.  Increased appetite.  Sleep problems. When a goiter is the result of an underactive thyroid (hypothyroidism), symptoms may also include:  Feeling like you have no energy (lethargy).  Inability to tolerate cold.  Weight gain that is not explained by a change in diet or exercise habits.  Dry skin.  Coarse hair.  Irregular menstrual periods.  Constipation.  Sadness or depression.  Fatigue. In some cases, there may not be any symptoms and the thyroid hormone levels may be normal. How is this diagnosed? This condition may be diagnosed based on your symptoms, your medical history, and a physical exam. You may have tests, such as:  Blood tests to check thyroid function.  Imaging tests, such as: ? Ultrasound. ? CT scan. ? MRI. ? Thyroid scan.  Removal of a tissue sample (biopsy) of the goiter or any nodules. The sample will be tested to check for cancer. How is this treated? Treatment for this condition depends on the cause and your symptoms. Treatment may include:  Medicines to regulate thyroid hormone levels.  Anti-inflammatory medicines or steroid medicines, if the goiter is caused by inflammation.  Iodine supplements or changes to your diet, if the goiter is caused by iodine deficiency.  Radioactive iodine treatment.  Surgery to remove your thyroid. In some cases, you may only need regular check-ups with  your health care provider to monitor your condition, and you may not need treatment. Follow these instructions at home:  Follow instructions from your health care provider about any changes to your diet.  Take over-the-counter and prescription medicines only as told by your health care provider. These include supplements.  Do not use any products that contain nicotine or tobacco, such as cigarettes and e-cigarettes. If you need help quitting, ask your health care provider.  Keep all follow-up visits as told by your health care provider. This is important. Contact a health care provider if:  Your symptoms do not get better with treatment.  You have nausea, vomiting, or diarrhea. Get help right away if:  You have sudden, unexplained confusion or other mental changes.  You have a fever.  You have chest pain.  You have trouble breathing or swallowing.  You suddenly become very weak.  You experience extreme restlessness.  You feel your heart racing. Summary  A goiter is an enlarged thyroid gland.  The thyroid gland is located in the lower front of the neck. It makes hormones that affect many body parts and systems, including the system that affects how quickly the body burns fuel for energy (metabolism).  The main symptom of this condition is swelling in the lower, front part of the neck. This swelling can range from a very small bump to a large lump.  Treatment for this condition depends on the cause and your symptoms. You may need medicines, supplements, or regular monitoring of your condition. This information is not intended to replace advice given to you by your health care provider. Make sure you discuss any questions you have with your health care provider. Document Released: 03/29/2010 Document Revised: 07/05/2017 Document Reviewed: 07/05/2017 Elsevier Interactive Patient Education  [employer].

## 2018-10-10 NOTE — Progress Notes (Signed)
* Subjective:     Patient ID: Deanna Sparks , female    DOB: 09/29/1976 , 42 y.o.   MRN: [id number]   Chief Complaint  Patient presents with  . Annual Exam  . Pelvic Pain    C/O bladder pain     HPI Pt is here for yearly physical, has GYN and last pap was 2018. Has not had her mammogram yet, has been called about this and will make apt.  Exercises 5 min a day doing weights and planks.   Past Medical History:  Diagnosis Date  . Genital herpes    last outbreak 12/2011  . Hypertension      Family History  Problem Relation Age of Onset  . Hypertension Mother   . Anesthesia problems Neg Hx   . Other Neg Hx      Current Outpatient Medications:  .  lisinopril-hydrochlorothiazide (PRINZIDE,ZESTORETIC) 20-12.5 MG tablet, Take 1 tablet by mouth daily., Disp: 90 tablet, Rfl: 0   Allergies  Allergen Reactions  . Codeine Itching  . Ultram [Tramadol] Itching     Review of Systems  Constitutional: Negative.   HENT: Negative.   Eyes: Negative.   Respiratory: Negative for cough and shortness of breath.   Cardiovascular: Negative for chest pain, palpitations and leg swelling.  Gastrointestinal: Negative.   Genitourinary: Positive for frequency and urgency. Negative for dysuria.       Has chronic urinary frequency and urgency from chronic bladder problems but could not tolerate medication causing vision and throat issues.   Musculoskeletal: Negative.   Skin: Negative for rash and wound.  Allergic/Immunologic: Negative for environmental allergies and food allergies.  Neurological: Negative for dizziness, tremors, seizures, syncope, weakness and headaches.  Hematological: Negative for adenopathy. Does not bruise/bleed easily.  Psychiatric/Behavioral: Negative for agitation and sleep disturbance. The patient is not nervous/anxious.        Denies depression     Today's Vitals   10/10/18 0858  BP: 120/78  Pulse: 84  Temp: 98.1 F (36.7 C)  TempSrc: Oral  SpO2: 98%  Weight:  216 lb 3.2 oz (98.1 kg)  Height: 5' 7"  (1.702 m)   Body mass index is 33.86 kg/m.   Objective:  Physical Exam  BP 120/78 (BP Location: Left Arm, Patient Position: Sitting, Cuff Size: Normal)   Pulse 84   Temp 98.1 F (36.7 C) (Oral)   Ht 5' 7"  (1.702 m)   Wt 216 lb 3.2 oz (98.1 kg)   SpO2 98%   BMI 33.86 kg/m   General Appearance:    Alert, cooperative, no distress, appears stated age  Head:    Normocephalic, without obvious abnormality, atraumatic  Eyes:    PERRL, conjunctiva/corneas clear, EOM's intact, fundi    benign, both eyes  Ears:    Normal TM's and external ear canals, both ears  Nose:   Nares normal, septum midline, mucosa normal, no drainage    or sinus tenderness  Throat:   Lips, mucosa, and tongue normal; teeth and gums normal  Neck:   Supple, symmetrical, trachea midline, no adenopathy;    thyroid:  Is enlarged, but no tenderness/nodules  Back:     Symmetric, no curvature, ROM normal, no CVA tenderness  Lungs:     Clear to auscultation bilaterally, respirations unlabored  Chest Wall:    No tenderness or deformity   Heart:    Regular rate and rhythm, S1 and S2 normal, no murmur, rub   or gallop  Breast Exam:  No tenderness, masses, or nipple abnormality  Abdomen:     Soft, non-tender, bowel sounds active all four quadrants,    no masses, no organomegaly        Extremities:   Extremities normal, atraumatic, no cyanosis or edema  Pulses:   2+ and symmetric all extremities  Skin:   Skin color, texture, turgor normal, no rashes or lesions  Lymph nodes:   Cervical, supraclavicular, and axillary nodes normal  Neurologic:   CNII-XII intact, normal strength, sensation and reflexes    Throughout. Normal Rhomberg, tandem gait, tip toe and heel gait, finger to nose.       Assessment And Plan:    1. Goiter- new.  - TSH - T4, Free - T3, free - Iodine, Serum/Plasma Thyroid US ordered.  2. Essential hypertension- stable. May continue current medication. FU 3  months with PCP - CMP14 + Anion Gap - CBC no Diff - Lipid Profile EKG was normal 3. Encounter for general adult medical examination with abnormal findings- routine. FU 1 y - Lipid Profile  4. Screening for diabetes mellitus- - Hemoglobin A1c  5- Obesity- pt is working on wt loss  Crosby Bevan RODRIGUEZ-SOUTHWORTH, PA-C

## 2018-10-11 LAB — CMP14 + ANION GAP
ALT: 17 IU/L (ref 0–32)
AST: 20 IU/L (ref 0–40)
Albumin/Globulin Ratio: 1.8 (ref 1.2–2.2)
Albumin: 4.5 g/dL (ref 3.5–5.5)
Alkaline Phosphatase: 63 IU/L (ref 39–117)
Anion Gap: 15 mmol/L (ref 10.0–18.0)
BUN/Creatinine Ratio: 16 (ref 9–23)
BUN: 13 mg/dL (ref 6–24)
Bilirubin Total: 0.7 mg/dL (ref 0.0–1.2)
CALCIUM: 9.5 mg/dL (ref 8.7–10.2)
CO2: 23 mmol/L (ref 20–29)
CREATININE: 0.82 mg/dL (ref 0.57–1.00)
Chloride: 103 mmol/L (ref 96–106)
GFR calc Af Amer: 101 mL/min/{1.73_m2} (ref >59)
GFR, EST NON AFRICAN AMERICAN: 88 mL/min/{1.73_m2} (ref >59)
GLUCOSE: 101 mg/dL — AB (ref 65–99)
Globulin, Total: 2.5 g/dL (ref 1.5–4.5)
Potassium: 4.1 mmol/L (ref 3.5–5.2)
Sodium: 141 mmol/L (ref 134–144)
TOTAL PROTEIN: 7 g/dL (ref 6.0–8.5)

## 2018-10-11 LAB — TSH: TSH: 0.93 u[IU]/mL (ref 0.450–4.500)

## 2018-10-11 LAB — CBC
HEMATOCRIT: 40.4 % (ref 34.0–46.6)
HEMOGLOBIN: 13.9 g/dL (ref 11.1–15.9)
MCH: 29.6 pg (ref 26.6–33.0)
MCHC: 34.4 g/dL (ref 31.5–35.7)
MCV: 86 fL (ref 79–97)
Platelets: 310 10*3/uL (ref 150–450)
RBC: 4.7 x10E6/uL (ref 3.77–5.28)
RDW: 12.4 % (ref 12.3–15.4)
WBC: 9.4 10*3/uL (ref 3.4–10.8)

## 2018-10-11 LAB — LIPID PANEL
CHOL/HDL RATIO: 3.6 ratio (ref 0.0–4.4)
Cholesterol, Total: 167 mg/dL (ref 100–199)
HDL: 46 mg/dL (ref >39)
LDL CALC: 109 mg/dL — AB (ref 0–99)
TRIGLYCERIDES: 61 mg/dL (ref 0–149)
VLDL Cholesterol Cal: 12 mg/dL (ref 5–40)

## 2018-10-11 LAB — T3, FREE: T3, Free: 2.9 pg/mL (ref 2.0–4.4)

## 2018-10-11 LAB — HEMOGLOBIN A1C
ESTIMATED AVERAGE GLUCOSE: 108 mg/dL
Hgb A1c MFr Bld: 5.4 % (ref 4.8–5.6)

## 2018-10-11 LAB — T4, FREE: Free T4: 1.27 ng/dL (ref 0.82–1.77)

## 2019-01-09 ENCOUNTER — Ambulatory Visit: Payer: BLUE CROSS/BLUE SHIELD | Admitting: Internal Medicine

## 2019-01-21 ENCOUNTER — Ambulatory Visit: Payer: BLUE CROSS/BLUE SHIELD | Admitting: Internal Medicine

## 2019-01-21 ENCOUNTER — Other Ambulatory Visit: Payer: Self-pay

## 2019-01-21 ENCOUNTER — Encounter: Payer: Self-pay | Admitting: Internal Medicine

## 2019-01-21 VITALS — BP 116/70 | HR 84 | Temp 98.6°F | Ht 67.0 in | Wt 202.0 lb

## 2019-01-21 DIAGNOSIS — I10 Essential (primary) hypertension: Secondary | ICD-10-CM | POA: Diagnosis not present

## 2019-01-21 DIAGNOSIS — E049 Nontoxic goiter, unspecified: Secondary | ICD-10-CM | POA: Insufficient documentation

## 2019-01-21 DIAGNOSIS — R0982 Postnasal drip: Secondary | ICD-10-CM

## 2019-01-21 DIAGNOSIS — Z6831 Body mass index (BMI) 31.0-31.9, adult: Secondary | ICD-10-CM

## 2019-01-21 DIAGNOSIS — E6609 Other obesity due to excess calories: Secondary | ICD-10-CM | POA: Diagnosis not present

## 2019-01-21 LAB — BMP8+EGFR
BUN/Creatinine Ratio: 15 (ref 9–23)
BUN: 13 mg/dL (ref 6–24)
CALCIUM: 9.8 mg/dL (ref 8.7–10.2)
CO2: 26 mmol/L (ref 20–29)
Chloride: 101 mmol/L (ref 96–106)
Creatinine, Ser: 0.88 mg/dL (ref 0.57–1.00)
GFR calc non Af Amer: 81 mL/min/{1.73_m2} (ref >59)
GFR, EST AFRICAN AMERICAN: 93 mL/min/{1.73_m2} (ref >59)
Glucose: 102 mg/dL — ABNORMAL HIGH (ref 65–99)
POTASSIUM: 4 mmol/L (ref 3.5–5.2)
Sodium: 144 mmol/L (ref 134–144)

## 2019-01-21 MED ORDER — LISINOPRIL-HYDROCHLOROTHIAZIDE 20-12.5 MG PO TABS: 1.0000 | ORAL_TABLET | Freq: Every day | ORAL | 1 refills | Status: DC

## 2019-01-21 NOTE — Patient Instructions (Signed)

## 2019-01-21 NOTE — Progress Notes (Signed)
Subjective:     Patient ID: Deanna Sparks , female    DOB: 11/04/1975 , 43 y.o.   MRN: [id number]   Chief Complaint  Patient presents with  . Hypertension    HPI  Hypertension  This is a chronic problem. The current episode started more than 1 year ago. The problem has been gradually improving since onset. The problem is controlled. Pertinent negatives include no anxiety, blurred vision, palpitations or shortness of breath.   She reports compliance with meds.   Past Medical History:  Diagnosis Date  . Genital herpes    last outbreak 12/2011  . Hypertension      Family History  Problem Relation Age of Onset  . Hypertension Mother   . Healthy Father   . Anesthesia problems Neg Hx   . Other Neg Hx      Current Outpatient Medications:  .  acyclovir (ZOVIRAX) 400 MG tablet, , Disp: , Rfl:  .  lisinopril-hydrochlorothiazide (PRINZIDE,ZESTORETIC) 20-12.5 MG tablet, Take 1 tablet by mouth daily., Disp: 90 tablet, Rfl: 1   Allergies  Allergen Reactions  . Codeine Itching  . Ultram [Tramadol] Itching     Review of Systems  Constitutional: Negative.   HENT: Positive for postnasal drip.   Eyes: Negative for blurred vision.  Respiratory: Negative.  Negative for shortness of breath.   Cardiovascular: Negative.  Negative for palpitations.  Gastrointestinal: Negative.   Neurological: Negative.   Psychiatric/Behavioral: Negative.      Today's Vitals   01/21/19 1042  BP: 116/70  Pulse: 84  Temp: 98.6 F (37 C)  TempSrc: Oral  Weight: 202 lb (91.6 kg)  Height: 5' 7"  (1.702 m)   Body mass index is 31.64 kg/m.   Objective:  Physical Exam Vitals signs and nursing note reviewed.  Constitutional:      Appearance: Normal appearance.  HENT:     Head: Normocephalic and atraumatic.  Neck:     Thyroid: Thyromegaly present.  Cardiovascular:     Rate and Rhythm: Normal rate and regular rhythm.     Heart sounds: Normal heart sounds.  Pulmonary:     Effort: Pulmonary  effort is normal.     Breath sounds: Normal breath sounds.  Skin:    General: Skin is warm.  Neurological:     General: No focal deficit present.     Mental Status: She is alert.  Psychiatric:        Mood and Affect: Mood normal.        Behavior: Behavior normal.         Assessment And Plan:     1. Essential hypertension  Well controlled. She will continue with her current meds. She is encouraged to avoid adding salt to her foods.   - BMP8+EGFR  2. Goiter  - US Soft Tissue Head/Neck; Future  3. Postnasal drip  She was given samples of Allegra to take once daily. She will let me know if her sx persist.   4. Class 1 obesity due to excess calories with serious comorbidity and body mass index (BMI) of 31.0 to 31.9 in adult  She was congratulated on her 14 pound weight loss since December. She is encouraged to keep up the great work.  Importance of achieving optimal weight to decrease risk of cardiovascular disease and cancers was discussed with the patient in full detail. She is encouraged to start slowly - start with 10 minutes twice daily at least three to four days per week and to gradually  build to 30 minutes five days weekly. She was given tips to incorporate more activity into her daily routine - take stairs when possible, park farther away from her job, grocery stores, etc.     Maximino Greenland, MD

## 2019-02-13 ENCOUNTER — Other Ambulatory Visit: Payer: Self-pay

## 2019-02-13 ENCOUNTER — Encounter: Payer: Self-pay | Admitting: Internal Medicine

## 2019-02-13 ENCOUNTER — Ambulatory Visit: Payer: BLUE CROSS/BLUE SHIELD | Admitting: Internal Medicine

## 2019-02-13 VITALS — BP 126/84 | HR 88 | Temp 98.0°F | Ht 67.0 in | Wt 203.6 lb

## 2019-02-13 DIAGNOSIS — Z113 Encounter for screening for infections with a predominantly sexual mode of transmission: Secondary | ICD-10-CM | POA: Diagnosis not present

## 2019-02-13 DIAGNOSIS — R350 Frequency of micturition: Secondary | ICD-10-CM

## 2019-02-13 DIAGNOSIS — Z202 Contact with and (suspected) exposure to infections with a predominantly sexual mode of transmission: Secondary | ICD-10-CM | POA: Diagnosis not present

## 2019-02-13 LAB — POCT URINALYSIS DIPSTICK
Glucose, UA: NEGATIVE
Ketones, UA: NEGATIVE
Leukocytes, UA: NEGATIVE
Nitrite, UA: NEGATIVE
Protein, UA: POSITIVE — AB
Spec Grav, UA: >=1.03 — AB (ref 1.010–1.025)
Urobilinogen, UA: 1 E.U./dL
pH, UA: 5.5 (ref 5.0–8.0)

## 2019-02-13 MED ORDER — NITROFURANTOIN MONOHYD MACRO 100 MG PO CAPS: 100.0000 mg | ORAL_CAPSULE | Freq: Two times a day (BID) | ORAL | 0 refills | Status: AC

## 2019-02-13 NOTE — Patient Instructions (Signed)
Dysuria Dysuria is pain or discomfort while urinating. The pain or discomfort may be felt in the part of your body that drains urine from the bladder (urethra) or in the surrounding tissue of the genitals. The pain may also be felt in the groin area, lower abdomen, or lower back. You may have to urinate frequently or have the sudden feeling that you have to urinate (urgency). Dysuria can affect both men and women, but it is more common in women. Dysuria can be caused by many different things, including:  Urinary tract infection.  Kidney stones or bladder stones.  Certain sexually transmitted infections (STIs), such as chlamydia.  Dehydration.  Inflammation of the tissues of the vagina.  Use of certain medicines.  Use of certain soaps or scented products that cause irritation. Follow these instructions at home: General instructions  Watch your condition for any changes.  Urinate often. Avoid holding urine for long periods of time.  After a bowel movement or urination, women should cleanse from front to back, using each tissue only once.  Urinate after sexual intercourse.  Keep all follow-up visits as told by your health care provider. This is important.  If you had any tests done to find the cause of dysuria, it is up to you to get your test results. Ask your health care provider, or the department that is doing the test, when your results will be ready. Eating and drinking   Drink enough fluid to keep your urine pale yellow.  Avoid caffeine, tea, and alcohol. They can irritate the bladder and make dysuria worse. In men, alcohol may irritate the prostate. Medicines  Take over-the-counter and prescription medicines only as told by your health care provider.  If you were prescribed an antibiotic medicine, take it as told by your health care provider. Do not stop taking the antibiotic even if you start to feel better. Contact a health care provider if:  You have a fever.   You develop pain in your back or sides.  You have nausea or vomiting.  You have blood in your urine.  You are not urinating as often as you usually do. Get help right away if:  Your pain is severe and not relieved with medicines.  You cannot eat or drink without vomiting.  You are confused.  You have a rapid heartbeat while at rest.  You have shaking or chills.  You feel extremely weak. Summary  Dysuria is pain or discomfort while urinating. Many different conditions can lead to dysuria.  If you have dysuria, you may have to urinate frequently or have the sudden feeling that you have to urinate (urgency).  Watch your condition for any changes. Keep all follow-up visits as told by your health care provider.  Make sure that you urinate often and drink enough fluid to keep your urine pale yellow. This information is not intended to replace advice given to you by your health care provider. Make sure you discuss any questions you have with your health care provider. Document Released: 07/07/2004 Document Revised: 07/26/2017 Document Reviewed: 07/26/2017 Elsevier Interactive Patient Education  2019 [employer].

## 2019-02-14 ENCOUNTER — Telehealth: Payer: Self-pay

## 2019-02-14 LAB — RPR: RPR Ser Ql: NONREACTIVE

## 2019-02-14 LAB — URINE CULTURE: Organism ID, Bacteria: NO GROWTH

## 2019-02-14 LAB — HIV ANTIBODY (ROUTINE TESTING W REFLEX): HIV Screen 4th Generation wRfx: NONREACTIVE

## 2019-02-14 NOTE — Telephone Encounter (Signed)
Left the patient a message to call back for lab results. 

## 2019-02-14 NOTE — Telephone Encounter (Signed)
You are HIV negative. You are negative for syphilis.  Patient notified [facility]

## 2019-02-16 LAB — CHLAMYDIA/GONOCOCCUS/TRICHOMONAS, NAA
Chlamydia by NAA: NEGATIVE
Gonococcus by NAA: NEGATIVE
Trich vag by NAA: NEGATIVE

## 2019-02-16 NOTE — Progress Notes (Signed)
  Subjective:     Patient ID: Deanna Sparks , female    DOB: [birth year] , 43 y.o.   MRN: [id number]   Chief Complaint  Patient presents with  . urinary frequency    HPI  She is here today for further evaluation of urinary frequency. Admits she is in a new relationship, and has been having unprotected sex. Recently diagnosed with gonorrhea. She was treated at Urgent Care. She is concerned b/c she is still having symptoms of frequency and dysuria. She wants to be sure the infection has cleared up. She does not have any vaginal discharge.     Past Medical History:  Diagnosis Date  . Genital herpes    last outbreak 12/2011  . Hypertension      Family History  Problem Relation Age of Onset  . Hypertension Mother   . Healthy Father   . Anesthesia problems Neg Hx   . Other Neg Hx      Current Outpatient Medications:  .  acyclovir (ZOVIRAX) 400 MG tablet, , Disp: , Rfl:  .  lisinopril-hydrochlorothiazide (PRINZIDE,ZESTORETIC) 20-12.5 MG tablet, Take 1 tablet by mouth daily., Disp: 90 tablet, Rfl: 1 .  nitrofurantoin, macrocrystal-monohydrate, (MACROBID) 100 MG capsule, Take 1 capsule (100 mg total) by mouth 2 (two) times daily for 7 days., Disp: 14 capsule, Rfl: 0   Allergies  Allergen Reactions  . Codeine Itching  . Ultram [Tramadol] Itching     Review of Systems  Constitutional: Negative.   Respiratory: Negative.   Cardiovascular: Negative.   Gastrointestinal: Negative.   Genitourinary: Positive for dysuria and frequency.  Neurological: Negative.   Psychiatric/Behavioral: Negative.      Today's Vitals   02/13/19 1004  BP: 126/84  Pulse: 88  Temp: 98 F (36.7 C)  TempSrc: Oral  Weight: 203 lb 9.6 oz (92.4 kg)  Height: 5' 7"  (1.702 m)   Body mass index is 31.89 kg/m.   Objective:  Physical Exam Vitals signs and nursing note reviewed.  Constitutional:      Appearance: Normal appearance.  HENT:     Head: Normocephalic and atraumatic.  Cardiovascular:    Rate and Rhythm: Normal rate and regular rhythm.     Heart sounds: Normal heart sounds.  Pulmonary:     Effort: Pulmonary effort is normal.     Breath sounds: Normal breath sounds.  Abdominal:     General: Bowel sounds are normal.     Palpations: Abdomen is soft.     Comments: No suprapubic tenderness  Skin:    General: Skin is warm.  Neurological:     General: No focal deficit present.     Mental Status: She is alert.  Psychiatric:        Mood and Affect: Mood normal.        Behavior: Behavior normal.         Assessment And Plan:     1. Urinary frequency  I will check labs as listed below.  - Culture, Urine - POCT Urinalysis Dipstick (81002)  2. STD exposure  Safe sex practices were reviewed with the patient. I will perform STD screening at her request. Urgent care notes were reviewed in full under Care Everywhere.   - HIV antibody (with reflex) - RPR - Chlamydia/Gonococcus/Trichomonas, NAA        Maximino Greenland, MD    THE PATIENT IS ENCOURAGED TO PRACTICE SOCIAL DISTANCING DUE TO THE COVID-19 PANDEMIC.

## 2019-02-18 ENCOUNTER — Telehealth: Payer: Self-pay

## 2019-02-18 NOTE — Telephone Encounter (Signed)
Left a detailed message at pt's request.

## 2019-02-18 NOTE — Telephone Encounter (Signed)
Left a detailed message at the pt's request. 

## 2019-02-18 NOTE — Telephone Encounter (Signed)
-----   Message from Glendale Chard, MD sent at 02/14/2019  9:25 AM EDT ----- You are HIV negative. You are negative for syphilis.

## 2019-02-18 NOTE — Telephone Encounter (Signed)
Left a detailed message on voicemail at pt's request.

## 2019-02-18 NOTE — Telephone Encounter (Signed)
-----   Message from Glendale Chard, MD sent at 02/15/2019  8:47 PM EDT ----- Urine culture is negative.

## 2019-02-18 NOTE — Telephone Encounter (Signed)
-----   Message from Glendale Chard, MD sent at 02/16/2019 11:06 AM EDT ----- She is negative for gonorrhea, chlamydia and trichomonas.

## 2019-03-19 ENCOUNTER — Other Ambulatory Visit: Payer: Self-pay | Admitting: Internal Medicine

## 2019-04-07 ENCOUNTER — Ambulatory Visit: Payer: BLUE CROSS/BLUE SHIELD | Admitting: Nurse Practitioner

## 2019-04-07 ENCOUNTER — Other Ambulatory Visit: Payer: Self-pay

## 2019-05-12 ENCOUNTER — Encounter (HOSPITAL_COMMUNITY): Payer: Self-pay

## 2019-05-12 ENCOUNTER — Ambulatory Visit (HOSPITAL_COMMUNITY)
Admission: EM | Admit: 2019-05-12 | Discharge: 2019-05-12 | Disposition: A | Discharge status: Home / Self Care | Payer: BLUE CROSS/BLUE SHIELD | Attending: Family Medicine | Admitting: Family Medicine

## 2019-05-12 ENCOUNTER — Other Ambulatory Visit: Payer: Self-pay

## 2019-05-12 DIAGNOSIS — R300 Dysuria: Secondary | ICD-10-CM | POA: Diagnosis present

## 2019-05-12 LAB — POCT URINALYSIS DIP (DEVICE)
Bilirubin Urine: NEGATIVE
Glucose, UA: NEGATIVE mg/dL
Ketones, ur: NEGATIVE mg/dL
Nitrite: NEGATIVE
Protein, ur: NEGATIVE mg/dL
Specific Gravity, Urine: >=1.03 (ref 1.005–1.030)
Urobilinogen, UA: 0.2 mg/dL (ref 0.0–1.0)
pH: 6 (ref 5.0–8.0)

## 2019-05-12 MED ORDER — FLUCONAZOLE 150 MG PO TABS: 150.0000 mg | ORAL_TABLET | Freq: Every day | ORAL | 0 refills | Status: DC

## 2019-05-12 MED ORDER — CEPHALEXIN 500 MG PO CAPS: 500.0000 mg | ORAL_CAPSULE | Freq: Two times a day (BID) | ORAL | 0 refills | Status: AC

## 2019-05-12 NOTE — Discharge Instructions (Signed)
We are treating you for a urinary tract infection and yeast infection. Take the medication as prescribed We will call you with any positive results

## 2019-05-12 NOTE — ED Provider Notes (Signed)
[facility]    CSN: [id number] Arrival date & time: 05/12/19  1411     History   Chief Complaint Chief Complaint  Patient presents with   Urinary Tract Infection    HPI Deanna BOKHARI is a 43 y.o. female.   Patient is a 43 year old female with past medical history of herpes, hypertension.  She presents today with approximate 3 days of dysuria, lower back pain.  Symptoms have been constant.  She has also had some mild vaginal discharge with itching and irritation.  Currently sexually active with one partner.  Recently STD tested approximately 3 months ago.  Denies any associated abdominal pain, pelvic pain, fevers, urinary frequency, hematuria.  ROS per HPI      Past Medical History:  Diagnosis Date   Genital herpes    last outbreak 12/2011   Hypertension     Patient Active Problem List   Diagnosis Date Noted   Essential hypertension 01/21/2019   Goiter 01/21/2019   Postnasal drip 01/21/2019   Plantar fasciitis 10/10/2018   Carpal tunnel syndrome 10/22/2015   Preterm labor 04/03/2012    Past Surgical History:  Procedure Laterality Date   CESAREAN SECTION     LIPOMA EXCISION  2008   removed from forehead    OB History    Gravida  4   Para  3   Term  2   Preterm  1   AB  0   Living  3     SAB  0   TAB  0   Ectopic  0   Multiple  0   Live Births  1            Home Medications    Prior to Admission medications   Medication Sig Start Date End Date Taking? Authorizing Provider  acyclovir (ZOVIRAX) 400 MG tablet TAKE 1 TABLET(400 MG) BY MOUTH TWICE DAILY 03/19/19   Glendale Chard, MD  cephALEXin (KEFLEX) 500 MG capsule Take 1 capsule (500 mg total) by mouth 2 (two) times daily for 7 days. 05/12/19 05/19/19  Loura Halt A, NP  fluconazole (DIFLUCAN) 150 MG tablet Take 1 tablet (150 mg total) by mouth daily. 05/12/19   Loura Halt A, NP  lisinopril-hydrochlorothiazide (PRINZIDE,ZESTORETIC) 20-12.5 MG tablet Take 1  tablet by mouth daily. 01/21/19   Glendale Chard, MD    Family History Family History  Problem Relation Age of Onset   Hypertension Mother    Healthy Father    Anesthesia problems Neg Hx    Other Neg Hx     Social History Social History   Tobacco Use   Smoking status: Never Smoker   Smokeless tobacco: Never Used  Substance Use Topics   Alcohol use: Yes    Comment: sometimes   Drug use: No     Allergies   Codeine and Ultram [tramadol]   Review of Systems Review of Systems   Physical Exam Triage Vital Signs ED Triage Vitals  Enc Vitals Group     BP 05/12/19 1458 (!) 132/96     Pulse Rate 05/12/19 1458 79     Resp 05/12/19 1458 19     Temp 05/12/19 1458 98.1 F (36.7 C)     Temp Source 05/12/19 1458 Oral     SpO2 05/12/19 1458 100 %     Weight --      Height --      Head Circumference --      Peak Flow --  Pain Score 05/12/19 1457 6     Pain Loc --      Pain Edu? --      Excl. in [place]? --    No data found.  Updated Vital Signs BP (!) 132/96 (BP Location: Left Arm)    Pulse 79    Temp 98.1 F (36.7 C) (Oral)    Resp 19    SpO2 100%   Visual Acuity Right Eye Distance:   Left Eye Distance:   Bilateral Distance:    Right Eye Near:   Left Eye Near:    Bilateral Near:     Physical Exam Vitals signs and nursing note reviewed.  Constitutional:      General: She is not in acute distress.    Appearance: Normal appearance. She is not ill-appearing, toxic-appearing or diaphoretic.  HENT:     Head: Normocephalic.     Nose: Nose normal.     Mouth/Throat:     Pharynx: Oropharynx is clear.  Eyes:     Conjunctiva/sclera: Conjunctivae normal.  Neck:     Musculoskeletal: Normal range of motion.  Pulmonary:     Effort: Pulmonary effort is normal.  Abdominal:     Palpations: Abdomen is soft.     Tenderness: There is no abdominal tenderness.  Musculoskeletal: Normal range of motion.  Skin:    General: Skin is warm and dry.     Findings: No  rash.  Neurological:     Mental Status: She is alert.  Psychiatric:        Mood and Affect: Mood normal.      UC Treatments / Results  Labs (all labs ordered are listed, but only abnormal results are displayed) Labs Reviewed  POCT URINALYSIS DIP (DEVICE) - Abnormal; Notable for the following components:      Result Value   Hgb urine dipstick TRACE (*)    Leukocytes,Ua TRACE (*)    All other components within normal limits  URINE CULTURE  CERVICOVAGINAL ANCILLARY ONLY    EKG   Radiology No results found.  Procedures Procedures (including critical care time)  Medications Ordered in UC Medications - No data to display  Initial Impression / Assessment and Plan / UC Course  I have reviewed the triage vital signs and the nursing notes.  Pertinent labs & imaging results that were available during my care of the patient were reviewed by me and considered in my medical decision making (see chart for details).     Urine with trace leuks and trace hemoglobin.  Sending for culture. Will treat with Keflex for possible urinary tract infection pending culture. Also treating for yeast infection based on symptoms Sending self swab for further testing with labs pending  Final Clinical Impressions(s) / UC Diagnoses   Final diagnoses:  Dysuria     Discharge Instructions     We are treating you for a urinary tract infection and yeast infection. Take the medication as prescribed We will call you with any positive results    ED Prescriptions    Medication Sig Dispense Auth. Provider   cephALEXin (KEFLEX) 500 MG capsule Take 1 capsule (500 mg total) by mouth 2 (two) times daily for 7 days. 14 capsule Roshaun Pound A, NP   fluconazole (DIFLUCAN) 150 MG tablet Take 1 tablet (150 mg total) by mouth daily. 2 tablet Loura Halt A, NP     Controlled Substance Prescriptions [place] Controlled Substance Registry consulted? Not Applicable   Orvan July, NP 05/12/19 [phone]

## 2019-05-12 NOTE — ED Triage Notes (Signed)
Patient presents to Urgent Care with complaints of lower back pain and discomfort during urination since about 3 days ago. Patient reports she thinks she has a UTI.

## 2019-05-13 LAB — URINE CULTURE: Culture: <10000 — AB

## 2019-05-15 ENCOUNTER — Telehealth (HOSPITAL_COMMUNITY): Payer: Self-pay | Admitting: Emergency Medicine

## 2019-05-15 LAB — CERVICOVAGINAL ANCILLARY ONLY
Bacterial vaginitis: POSITIVE — AB
Candida vaginitis: POSITIVE — AB
Chlamydia: NEGATIVE
Neisseria Gonorrhea: NEGATIVE
Trichomonas: NEGATIVE

## 2019-05-15 MED ORDER — METRONIDAZOLE 500 MG PO TABS: 500.0000 mg | ORAL_TABLET | Freq: Two times a day (BID) | ORAL | 0 refills | Status: AC

## 2019-05-15 NOTE — Telephone Encounter (Signed)
Bacterial vaginosis is positive. This was not treated at the urgent care visit.  Flagyl 500 mg BID x 7 days #14 no refills sent to patients pharmacy of choice.    Patient contacted and made aware of all results, all questions answered. Pt stated she was still having vaginal irritation and symptoms, urine culture was negative for UTI, pt can stop keflex.

## 2019-05-28 DIAGNOSIS — E559 Vitamin D deficiency, unspecified: Secondary | ICD-10-CM | POA: Insufficient documentation

## 2019-05-29 DIAGNOSIS — B009 Herpesviral infection, unspecified: Secondary | ICD-10-CM | POA: Insufficient documentation

## 2019-05-29 DIAGNOSIS — E010 Iodine-deficiency related diffuse (endemic) goiter: Secondary | ICD-10-CM | POA: Insufficient documentation

## 2019-05-29 DIAGNOSIS — F411 Generalized anxiety disorder: Secondary | ICD-10-CM | POA: Insufficient documentation

## 2019-10-14 ENCOUNTER — Encounter: Payer: BLUE CROSS/BLUE SHIELD | Admitting: Internal Medicine

## 2019-11-26 ENCOUNTER — Encounter: Payer: Self-pay | Admitting: Internal Medicine

## 2020-01-03 ENCOUNTER — Other Ambulatory Visit: Payer: Self-pay

## 2020-01-03 ENCOUNTER — Ambulatory Visit (HOSPITAL_COMMUNITY)
Admission: EM | Admit: 2020-01-03 | Discharge: 2020-01-03 | Disposition: A | Discharge status: Home / Self Care | Payer: 59 | Attending: Physician Assistant | Admitting: Physician Assistant

## 2020-01-03 ENCOUNTER — Encounter (HOSPITAL_COMMUNITY): Payer: Self-pay | Admitting: *Deleted

## 2020-01-03 DIAGNOSIS — N3000 Acute cystitis without hematuria: Secondary | ICD-10-CM | POA: Insufficient documentation

## 2020-01-03 DIAGNOSIS — Z3202 Encounter for pregnancy test, result negative: Secondary | ICD-10-CM | POA: Diagnosis not present

## 2020-01-03 LAB — POCT URINALYSIS DIP (DEVICE)
Bilirubin Urine: NEGATIVE
Glucose, UA: NEGATIVE mg/dL
Ketones, ur: NEGATIVE mg/dL
Leukocytes,Ua: NEGATIVE
Nitrite: NEGATIVE
Protein, ur: NEGATIVE mg/dL
Specific Gravity, Urine: 1.025 (ref 1.005–1.030)
Urobilinogen, UA: 0.2 mg/dL (ref 0.0–1.0)
pH: 6 (ref 5.0–8.0)

## 2020-01-03 LAB — POCT PREGNANCY, URINE: Preg Test, Ur: NEGATIVE

## 2020-01-03 LAB — POC URINE PREG, ED: Preg Test, Ur: NEGATIVE

## 2020-01-03 MED ORDER — FLUCONAZOLE 150 MG PO TABS: 150.0000 mg | ORAL_TABLET | Freq: Every day | ORAL | 0 refills | Status: DC

## 2020-01-03 MED ORDER — CEPHALEXIN 500 MG PO CAPS: 500.0000 mg | ORAL_CAPSULE | Freq: Two times a day (BID) | ORAL | 0 refills | Status: AC

## 2020-01-03 NOTE — ED Provider Notes (Signed)
[place]    CSN: [id number] Arrival date & time: 01/03/20  1016      History   Chief Complaint Chief Complaint  Patient presents with  . Abdominal Pain    HPI Deanna Sparks is a 44 y.o. female.   Patient presents with 2 days of lower abdominal pain.  She reports mild painful urination.  She reports frequent urination and urinary urgency.  She also reports small amount of radiation of her low abdominal pain into her left flank.  Denies blood in her urine.  She denies fever and chills.  She denies nausea, vomiting, diarrhea.  Shorts a history of UTIs and has responded well to previous treatments.  She denies vaginal discharge or irritation today.     Past Medical History:  Diagnosis Date  . Genital herpes    last outbreak 12/2011  . Hypertension     Patient Active Problem List   Diagnosis Date Noted  . Essential hypertension 01/21/2019  . Goiter 01/21/2019  . Postnasal drip 01/21/2019  . Plantar fasciitis 10/10/2018  . Carpal tunnel syndrome 10/22/2015  . Preterm labor 04/03/2012    Past Surgical History:  Procedure Laterality Date  . CESAREAN SECTION    . LIPOMA EXCISION  2008   removed from forehead    OB History    Gravida  4   Para  3   Term  2   Preterm  1   AB  0   Living  3     SAB  0   TAB  0   Ectopic  0   Multiple  0   Live Births  1            Home Medications    Prior to Admission medications   Medication Sig Start Date End Date Taking? Authorizing Provider  acyclovir (ZOVIRAX) 400 MG tablet TAKE 1 TABLET(400 MG) BY MOUTH TWICE DAILY 03/19/19  Yes Glendale Chard, MD  lisinopril-hydrochlorothiazide (PRINZIDE,ZESTORETIC) 20-12.5 MG tablet Take 1 tablet by mouth daily. 01/21/19  Yes Glendale Chard, MD  cephALEXin (KEFLEX) 500 MG capsule Take 1 capsule (500 mg total) by mouth 2 (two) times daily for 7 days. 01/03/20 01/10/20  Jameire Kouba, Marguerita Beards, PA-C  fluconazole (DIFLUCAN) 150 MG tablet Take 1 tablet (150 mg total) by  mouth daily. 05/12/19   Orvan July, NP    Family History Family History  Problem Relation Age of Onset  . Hypertension Mother   . Healthy Father   . Anesthesia problems Neg Hx   . Other Neg Hx     Social History Social History   Tobacco Use  . Smoking status: Never Smoker  . Smokeless tobacco: Never Used  Substance Use Topics  . Alcohol use: Yes    Comment: occasionally  . Drug use: Never     Allergies   Codeine and Ultram [tramadol]   Review of Systems Review of Systems  Constitutional: Negative for chills and fever.  Gastrointestinal: Positive for abdominal pain. Negative for diarrhea, nausea and vomiting.  Genitourinary: Positive for dysuria, flank pain, frequency and urgency. Negative for genital sores, hematuria, pelvic pain, vaginal bleeding, vaginal discharge and vaginal pain.  Musculoskeletal: Negative for back pain and myalgias.     Physical Exam Triage Vital Signs ED Triage Vitals  Enc Vitals Group     BP      Pulse      Resp      Temp      Temp src  SpO2      Weight      Height      Head Circumference      Peak Flow      Pain Score      Pain Loc      Pain Edu?      Excl. in [place]?    No data found.  Updated Vital Signs BP (!) 141/99 Comment: has not taken HTN med today  Pulse 76   Temp 98.7 F (37.1 C) (Oral)   Resp 18   LMP 12/21/2019 (Exact Date)   SpO2 100%   Visual Acuity Right Eye Distance:   Left Eye Distance:   Bilateral Distance:    Right Eye Near:   Left Eye Near:    Bilateral Near:     Physical Exam Vitals and nursing note reviewed.  Constitutional:      General: She is not in acute distress.    Appearance: She is well-developed. She is not ill-appearing.  HENT:     Head: Normocephalic and atraumatic.  Eyes:     Extraocular Movements: Extraocular movements intact.     Conjunctiva/sclera: Conjunctivae normal.     Pupils: Pupils are equal, round, and reactive to light.  Cardiovascular:     Rate and Rhythm:  Normal rate and regular rhythm.     Heart sounds: No murmur.  Pulmonary:     Effort: Pulmonary effort is normal. No respiratory distress.     Breath sounds: Normal breath sounds.  Abdominal:     Palpations: Abdomen is soft.     Tenderness: There is abdominal tenderness in the suprapubic area. There is no right CVA tenderness, left CVA tenderness or rebound.  Musculoskeletal:     Cervical back: Neck supple.  Skin:    General: Skin is warm and dry.  Neurological:     General: No focal deficit present.     Mental Status: She is alert and oriented to person, place, and time.      UC Treatments / Results  Labs (all labs ordered are listed, but only abnormal results are displayed) Labs Reviewed  POCT URINALYSIS DIP (DEVICE) - Abnormal; Notable for the following components:      Result Value   Hgb urine dipstick TRACE (*)    All other components within normal limits  URINE CULTURE  POCT PREGNANCY, URINE  POC URINE PREG, ED    EKG   Radiology No results found.  Procedures Procedures (including critical care time)  Medications Ordered in UC Medications - No data to display  Initial Impression / Assessment and Plan / UC Course  I have reviewed the triage vital signs and the nursing notes.  Pertinent labs & imaging results that were available during my care of the patient were reviewed by me and considered in my medical decision making (see chart for details).     #Cystitis Patient is a 44 year old female presenting with symptoms consistent with acute cystitis.  UA unrevealing with only trace blood.  Urine culture is sent we will treat with Keflex twice daily for 7 days given symptoms consistent with cystitis with possible discontinuation pending culture..  Urine culture also sent.  Follow-up and return precautions discussed.  Patient verbalized understanding.  Final Clinical Impressions(s) / UC Diagnoses   Final diagnoses:  Acute cystitis without hematuria      Discharge Instructions     Take the Keflex  We sent a urine culture and will notify you of any necessary changes to your treatment  If you do not have a symptom improvement 3 to 5 days please return for reevaluation may also follow-up with your primary care.    ED Prescriptions    Medication Sig Dispense Auth. Provider   cephALEXin (KEFLEX) 500 MG capsule Take 1 capsule (500 mg total) by mouth 2 (two) times daily for 7 days. 14 capsule Ceciley Buist, Marguerita Beards, PA-C     PDMP not reviewed this encounter.   Purnell Shoemaker, PA-C 01/03/20 1458

## 2020-01-03 NOTE — ED Triage Notes (Signed)
C/O starting with intermittent suprapubic abd pain approx 2 days ago.  Last night pain became constant and has progressively gotten worse.  Pain increases with urination.  Unsure if polyuria.  Denies fevers, n/v/d.  Denies vaginal discharge.

## 2020-01-03 NOTE — Discharge Instructions (Signed)
Take the Keflex  We sent a urine culture and will notify you of any necessary changes to your treatment  If you do not have a symptom improvement 3 to 5 days please return for reevaluation may also follow-up with your primary care.

## 2020-01-04 LAB — URINE CULTURE: Culture: <10000 — AB

## 2020-01-06 ENCOUNTER — Telehealth (HOSPITAL_COMMUNITY): Payer: Self-pay | Admitting: *Deleted

## 2020-01-06 NOTE — Telephone Encounter (Signed)
UTI was treated with Keflex. Patient called and states she is feeling better and is taking the antibiotic as prescribed. Instructed patient to f/u with PCP if symptoms do not improved with full course of antibiotic.

## 2020-01-13 ENCOUNTER — Ambulatory Visit (INDEPENDENT_AMBULATORY_CARE_PROVIDER_SITE_OTHER): Payer: 59 | Admitting: Internal Medicine

## 2020-01-13 ENCOUNTER — Other Ambulatory Visit: Payer: Self-pay

## 2020-01-13 ENCOUNTER — Encounter: Payer: Self-pay | Admitting: Internal Medicine

## 2020-01-13 VITALS — BP 132/90 | HR 69 | Temp 98.1°F | Ht 67.0 in | Wt 208.2 lb

## 2020-01-13 DIAGNOSIS — N898 Other specified noninflammatory disorders of vagina: Secondary | ICD-10-CM

## 2020-01-13 DIAGNOSIS — R350 Frequency of micturition: Secondary | ICD-10-CM

## 2020-01-13 DIAGNOSIS — Z6832 Body mass index (BMI) 32.0-32.9, adult: Secondary | ICD-10-CM

## 2020-01-13 DIAGNOSIS — E041 Nontoxic single thyroid nodule: Secondary | ICD-10-CM | POA: Diagnosis not present

## 2020-01-13 DIAGNOSIS — E6609 Other obesity due to excess calories: Secondary | ICD-10-CM | POA: Diagnosis not present

## 2020-01-13 LAB — POCT URINALYSIS DIPSTICK
Glucose, UA: NEGATIVE
Ketones, UA: NEGATIVE
Nitrite, UA: NEGATIVE
Protein, UA: POSITIVE — AB
Spec Grav, UA: >=1.03 — AB (ref 1.010–1.025)
Urobilinogen, UA: 1 E.U./dL
pH, UA: 6 (ref 5.0–8.0)

## 2020-01-13 MED ORDER — FLUCONAZOLE 150 MG PO TABS: 150.0000 mg | ORAL_TABLET | Freq: Every day | ORAL | 0 refills | Status: DC

## 2020-01-13 NOTE — Progress Notes (Signed)
This visit occurred during the SARS-CoV-2 public health emergency.  Safety protocols were in place, including screening questions prior to the visit, additional usage of staff PPE, and extensive cleaning of exam room while observing appropriate contact time as indicated for disinfecting solutions.  Subjective:     Patient ID: Deanna Sparks , female    DOB: 1976-04-12 , 44 y.o.   MRN: [id number]   Chief Complaint  Patient presents with  . urinary frequency    HPI  She is here today for for further evaluation of urinary frequency and vaginal itching. Recently diagnosed with yeast infection by GYN. Sx did not improve with one dose of fluconazole. No dysuria. She is sexually active.     Past Medical History:  Diagnosis Date  . Genital herpes    last outbreak 12/2011  . Hypertension      Family History  Problem Relation Age of Onset  . Hypertension Mother   . Healthy Father   . Anesthesia problems Neg Hx   . Other Neg Hx      Current Outpatient Medications:  .  acyclovir (ZOVIRAX) 400 MG tablet, TAKE 1 TABLET(400 MG) BY MOUTH TWICE DAILY, Disp: 120 tablet, Rfl: 2 .  lisinopril-hydrochlorothiazide (PRINZIDE,ZESTORETIC) 20-12.5 MG tablet, Take 1 tablet by mouth daily., Disp: 90 tablet, Rfl: 1 .  fluconazole (DIFLUCAN) 150 MG tablet, Take 1 tablet (150 mg total) by mouth daily. (Patient not taking: Reported on 01/13/2020), Disp: 1 tablet, Rfl: 0   Allergies  Allergen Reactions  . Codeine Itching  . Ultram [Tramadol] Itching     Review of Systems  Constitutional: Negative.   Respiratory: Negative.   Cardiovascular: Negative.   Gastrointestinal: Negative.   Genitourinary: Positive for frequency.  Neurological: Negative.   Psychiatric/Behavioral: Negative.      Today's Vitals   01/13/20 1139  BP: 132/90  Pulse: 69  Temp: 98.1 F (36.7 C)  TempSrc: Oral  Weight: 208 lb 3.2 oz (94.4 kg)  Height: 5' 7"  (1.702 m)   Body mass index is 32.61 kg/m.   Wt Readings  from Last 3 Encounters:  01/13/20 208 lb 3.2 oz (94.4 kg)  02/13/19 203 lb 9.6 oz (92.4 kg)  01/21/19 202 lb (91.6 kg)     Objective:  Physical Exam Vitals and nursing note reviewed.  Constitutional:      Appearance: Normal appearance. She is obese.  HENT:     Head: Normocephalic and atraumatic.  Neck:     Thyroid: Thyromegaly present. No thyroid tenderness.  Cardiovascular:     Rate and Rhythm: Normal rate and regular rhythm.     Heart sounds: Normal heart sounds.  Pulmonary:     Effort: Pulmonary effort is normal.     Breath sounds: Normal breath sounds.  Skin:    General: Skin is warm.  Neurological:     General: No focal deficit present.     Mental Status: She is alert.  Psychiatric:        Mood and Affect: Mood normal.        Behavior: Behavior normal.         Assessment And Plan:     1. Urinary frequency  I will check urinalysis today.   - POCT Urinalysis Dipstick (81002)  2. Vaginal itching  She agrees to STD testing. She was also given one dose of fluconazole.   - Chlamydia/Gonococcus/Trichomonas, NAA  3. Thyroid nodule  I will refer her for repeat thyroid u/s. This was due in August 2020.  4. Class 1 obesity due to excess calories without serious comorbidity with body mass index (BMI) of 32.0 to 32.9 in adult  She is encouraged to incorporate more exercise into her daily routine. Advised to aim for BMI less than 30 to decrease cardiac risk.    Maximino Greenland, MD    THE PATIENT IS ENCOURAGED TO PRACTICE SOCIAL DISTANCING DUE TO THE COVID-19 PANDEMIC.

## 2020-01-19 LAB — CHLAMYDIA/GONOCOCCUS/TRICHOMONAS, NAA
Chlamydia by NAA: NEGATIVE
Gonococcus by NAA: NEGATIVE
Trich vag by NAA: NEGATIVE

## 2020-01-20 ENCOUNTER — Other Ambulatory Visit: Payer: Self-pay | Admitting: Internal Medicine

## 2020-01-20 DIAGNOSIS — E049 Nontoxic goiter, unspecified: Secondary | ICD-10-CM

## 2020-01-21 ENCOUNTER — Ambulatory Visit
Admission: RE | Admit: 2020-01-21 | Discharge: 2020-01-21 | Disposition: A | Discharge status: Home / Self Care | Payer: 59 | Source: Ambulatory Visit | Attending: Internal Medicine | Admitting: Internal Medicine

## 2020-01-21 DIAGNOSIS — E049 Nontoxic goiter, unspecified: Secondary | ICD-10-CM

## 2020-06-23 LAB — RESULTS CONSOLE HPV: CHL HPV: POSITIVE

## 2020-06-23 LAB — HM PAP SMEAR

## 2020-07-03 ENCOUNTER — Ambulatory Visit (HOSPITAL_COMMUNITY)
Admission: EM | Admit: 2020-07-03 | Discharge: 2020-07-03 | Disposition: A | Discharge status: Home / Self Care | Payer: 59

## 2020-07-03 ENCOUNTER — Encounter (HOSPITAL_COMMUNITY): Payer: Self-pay | Admitting: *Deleted

## 2020-07-03 ENCOUNTER — Other Ambulatory Visit: Payer: Self-pay

## 2020-07-03 DIAGNOSIS — J069 Acute upper respiratory infection, unspecified: Secondary | ICD-10-CM

## 2020-07-03 NOTE — ED Provider Notes (Signed)
[place]    CSN: [id number] Arrival date & time: 07/03/20  1011      History   Chief Complaint Chief Complaint  Patient presents with  . Sore Throat  . Otalgia    HPI Deanna Sparks is a 44 y.o. female.   Patient presents with bilateral ear pain, sore throat, nasal congestion x2 to 3 days.  She denies fever, chills, cough, shortness of breath, vomiting, diarrhea, rash, or other symptoms.  Treatment attempted at home with ibuprofen and throat spray.  The history is provided by the patient.    Past Medical History:  Diagnosis Date  . Genital herpes    last outbreak 12/2011  . Hypertension     Patient Active Problem List   Diagnosis Date Noted  . Essential hypertension 01/21/2019  . Goiter 01/21/2019  . Postnasal drip 01/21/2019  . Plantar fasciitis 10/10/2018  . Carpal tunnel syndrome 10/22/2015  . Preterm labor 04/03/2012    Past Surgical History:  Procedure Laterality Date  . CESAREAN SECTION    . LIPOMA EXCISION  2008   removed from forehead    OB History    Gravida  4   Para  3   Term  2   Preterm  1   AB  0   Living  3     SAB  0   TAB  0   Ectopic  0   Multiple  0   Live Births  1            Home Medications    Prior to Admission medications   Medication Sig Start Date End Date Taking? Authorizing Provider  acyclovir (ZOVIRAX) 400 MG tablet TAKE 1 TABLET(400 MG) BY MOUTH TWICE DAILY 03/19/19  Yes Glendale Chard, MD  lisinopril-hydrochlorothiazide (PRINZIDE,ZESTORETIC) 20-12.5 MG tablet Take 1 tablet by mouth daily. 01/21/19  Yes Glendale Chard, MD  fluconazole (DIFLUCAN) 150 MG tablet Take 1 tablet (150 mg total) by mouth daily. 01/13/20   Glendale Chard, MD    Family History Family History  Problem Relation Age of Onset  . Hypertension Mother   . Healthy Father   . Anesthesia problems Neg Hx   . Other Neg Hx     Social History Social History   Tobacco Use  . Smoking status: Never Smoker  . Smokeless  tobacco: Never Used  Vaping Use  . Vaping Use: Never used  Substance Use Topics  . Alcohol use: Yes    Comment: occasionally  . Drug use: Never     Allergies   Codeine and Ultram [tramadol]   Review of Systems Review of Systems  Constitutional: Negative for chills and fever.  HENT: Positive for congestion, ear pain and sore throat.   Eyes: Negative for pain and visual disturbance.  Respiratory: Negative for cough and shortness of breath.   Cardiovascular: Negative for chest pain and palpitations.  Gastrointestinal: Negative for abdominal pain, diarrhea and vomiting.  Genitourinary: Negative for dysuria and hematuria.  Musculoskeletal: Negative for arthralgias and back pain.  Skin: Negative for color change and rash.  Neurological: Negative for seizures and syncope.  All other systems reviewed and are negative.    Physical Exam Triage Vital Signs ED Triage Vitals  Enc Vitals Group     BP 07/03/20 1107 130/82     Pulse Rate 07/03/20 1107 85     Resp 07/03/20 1107 18     Temp 07/03/20 1107 98.8 F (37.1 C)     Temp Source  07/03/20 1107 Oral     SpO2 07/03/20 1107 100 %     Weight --      Height --      Head Circumference --      Peak Flow --      Pain Score 07/03/20 1108 10     Pain Loc --      Pain Edu? --      Excl. in [place]? --    No data found.  Updated Vital Signs BP 130/82   Pulse 85   Temp 98.8 F (37.1 C) (Oral)   Resp 18   LMP 07/02/2020 (Exact Date)   SpO2 100%   Visual Acuity Right Eye Distance:   Left Eye Distance:   Bilateral Distance:    Right Eye Near:   Left Eye Near:    Bilateral Near:     Physical Exam Vitals and nursing note reviewed.  Constitutional:      General: She is not in acute distress.    Appearance: She is well-developed. She is not ill-appearing.  HENT:     Head: Normocephalic and atraumatic.     Right Ear: Tympanic membrane normal.     Left Ear: Tympanic membrane normal.     Nose: Nose normal.     Mouth/Throat:       Mouth: Mucous membranes are moist.     Pharynx: Oropharynx is clear.  Eyes:     Conjunctiva/sclera: Conjunctivae normal.  Cardiovascular:     Rate and Rhythm: Normal rate and regular rhythm.     Heart sounds: No murmur heard.   Pulmonary:     Effort: Pulmonary effort is normal. No respiratory distress.     Breath sounds: Normal breath sounds. No wheezing or rhonchi.  Abdominal:     Palpations: Abdomen is soft.     Tenderness: There is no abdominal tenderness. There is no guarding or rebound.  Musculoskeletal:     Cervical back: Neck supple.  Skin:    General: Skin is warm and dry.     Findings: No rash.  Neurological:     General: No focal deficit present.     Mental Status: She is alert and oriented to person, place, and time.     Gait: Gait normal.  Psychiatric:        Mood and Affect: Mood normal.        Behavior: Behavior normal.      UC Treatments / Results  Labs (all labs ordered are listed, but only abnormal results are displayed) Labs Reviewed - No data to display  EKG   Radiology No results found.  Procedures Procedures (including critical care time)  Medications Ordered in UC Medications - No data to display  Initial Impression / Assessment and Plan / UC Course  I have reviewed the triage vital signs and the nursing notes.  Pertinent labs & imaging results that were available during my care of the patient were reviewed by me and considered in my medical decision making (see chart for details).   Viral upper respiratory infection.  Patient declines COVID test today; she states she is tested weekly at work.  Symptomatic treatment with Tylenol or ibuprofen discussed.  Instructed her to follow-up with her PCP if her symptoms are not improving.  Patient agrees to plan of care.  Final Clinical Impressions(s) / UC Diagnoses   Final diagnoses:  Viral upper respiratory tract infection     Discharge Instructions     Take Tylenol or ibuprofen as  needed for discomfort.    Follow up with your primary care provider if your symptoms are not improving.       ED Prescriptions    None     PDMP not reviewed this encounter.   Sharion Balloon, NP 07/03/20 1120

## 2020-07-03 NOTE — ED Triage Notes (Signed)
C/O sore throat, bilat ear pain, congestion x 2-3 days without fever.  Declines Covid test - states tested weekly at work.

## 2020-07-03 NOTE — Discharge Instructions (Addendum)
Take Tylenol or ibuprofen as needed for discomfort.    Follow up with your primary care provider if your symptoms are not improving.

## 2020-07-07 ENCOUNTER — Encounter: Payer: Self-pay | Admitting: Internal Medicine

## 2020-07-07 ENCOUNTER — Ambulatory Visit: Payer: 59 | Admitting: Internal Medicine

## 2020-07-07 ENCOUNTER — Other Ambulatory Visit: Payer: Self-pay

## 2020-07-07 VITALS — BP 140/82 | HR 88 | Temp 98.0°F | Ht 67.0 in | Wt 210.0 lb

## 2020-07-07 DIAGNOSIS — I10 Essential (primary) hypertension: Secondary | ICD-10-CM | POA: Diagnosis not present

## 2020-07-07 DIAGNOSIS — R0982 Postnasal drip: Secondary | ICD-10-CM

## 2020-07-07 DIAGNOSIS — J300 Vasomotor rhinitis: Secondary | ICD-10-CM | POA: Diagnosis not present

## 2020-07-07 DIAGNOSIS — E041 Nontoxic single thyroid nodule: Secondary | ICD-10-CM

## 2020-07-07 MED ORDER — TRIAMCINOLONE ACETONIDE 40 MG/ML IJ SUSP
60.0000 mg | Freq: Once | INTRAMUSCULAR | Status: AC
  Administered 2020-07-07: 60 mg via INTRAMUSCULAR

## 2020-07-07 NOTE — Progress Notes (Signed)
This visit occurred during the SARS-CoV-2 public health emergency.  Safety protocols were in place, including screening questions prior to the visit, additional usage of staff PPE, and extensive cleaning of exam room while observing appropriate contact time as indicated for disinfecting solutions.  Subjective:     Patient ID: Deanna Sparks , female    DOB: November 19, 1975 , 44 y.o.   MRN: [id number]   Chief Complaint  Patient presents with  . Sinus Problem    HPI  She is here today for further evaluation of sinus congestion. Denies fever/chills. Does not think this is COVID. No relief with otc meds. She also feels as if something is dripping down her throat. Feels as if something is stuck in her throat. Wants to have her thyroid checked.   Of note, she went to [facility] on 9/11 c/o bilateral ear pain, sore throat, nasal congestion x2 to 3 days.  She denies fever, chills, cough, shortness of breath, vomiting, diarrhea, rash, or other symptoms.  She has weekly COVID tests at work, so she declined COVID testing at that time. She was diagnosed with viral URI - she has had no improvement in her sx. She does not wish to be tested for COVID today. She has yet to be vaccinated.     Past Medical History:  Diagnosis Date  . Genital herpes    last outbreak 12/2011  . Hypertension      Family History  Problem Relation Age of Onset  . Hypertension Mother   . Healthy Father   . Anesthesia problems Neg Hx   . Other Neg Hx      Current Outpatient Medications:  .  acyclovir (ZOVIRAX) 400 MG tablet, TAKE 1 TABLET(400 MG) BY MOUTH TWICE DAILY, Disp: 120 tablet, Rfl: 2 .  azithromycin (ZITHROMAX) 250 MG tablet, Use as directed, Disp: 6 each, Rfl: 0 .  lisinopril-hydrochlorothiazide (PRINZIDE,ZESTORETIC) 20-12.5 MG tablet, Take 1 tablet by mouth daily., Disp: 90 tablet, Rfl: 1   Allergies  Allergen Reactions  . Codeine Itching  . Ultram [Tramadol] Itching     Review of Systems   Constitutional: Negative.   HENT: Positive for congestion and postnasal drip.   Respiratory: Negative.   Cardiovascular: Negative.   Gastrointestinal: Negative.   Neurological: Negative.   Psychiatric/Behavioral: Negative.      Today's Vitals   07/07/20 1545  BP: 140/82  Pulse: 88  Temp: 98 F (36.7 C)  TempSrc: Oral  Weight: 210 lb (95.3 kg)  Height: 5' 7"  (1.702 m)   Body mass index is 32.89 kg/m.   Objective:  Physical Exam Vitals and nursing note reviewed.  Constitutional:      Appearance: Normal appearance. She is obese.  HENT:     Head: Normocephalic and atraumatic.     Right Ear: Tympanic membrane, ear canal and external ear normal.     Left Ear: Tympanic membrane, ear canal and external ear normal.     Nose:     Comments: Deferred, masked Neck:     Thyroid: Thyromegaly present. No thyroid tenderness.  Cardiovascular:     Rate and Rhythm: Normal rate and regular rhythm.     Heart sounds: Normal heart sounds.  Pulmonary:     Effort: Pulmonary effort is normal.     Breath sounds: Normal breath sounds.  Skin:    General: Skin is warm.  Neurological:     General: No focal deficit present.     Mental Status: She is alert.  Psychiatric:        Mood and Affect: Mood normal.        Behavior: Behavior normal.         Assessment And Plan:     1. Vasomotor rhinitis Comments: She was given Kenalog, 24m IM x1. I will also refer her to Allergy for further evaluation. She agrees w/ referral. May get relief from Atrovent NS.  - Ambulatory referral to Allergy - triamcinolone acetonide (KENALOG-40) injection 60 mg  2. Postnasal drip  Advised to take loratadine or cetirizine OTC daily. Pt reminded she may have to stop meds prior to her Allergy appointment.   3. Thyroid nodule Comments: Most recent thyroid u/s reviewed in detail. I will check thyroid panel and make recommendations as needed.  - TSH - T4, Free - Thyroid antibodies  4. Essential  hypertension Comments: Chronic, fair control. She has yet to take meds today. Advised to take meds daily, importance of dietary/medication compliance stressed to patient.      Patient was given opportunity to ask questions. Patient verbalized understanding of the plan and was able to repeat key elements of the plan. All questions were answered to their satisfaction.  RMaximino Greenland MD   I, RMaximino Greenland MD, have reviewed all documentation for this visit. The documentation on 07/13/20 for the exam, diagnosis, procedures, and orders are all accurate and complete.  THE PATIENT IS ENCOURAGED TO PRACTICE SOCIAL DISTANCING DUE TO THE COVID-19 PANDEMIC.

## 2020-07-08 ENCOUNTER — Other Ambulatory Visit: Payer: Self-pay

## 2020-07-08 LAB — THYROID ANTIBODIES
Thyroglobulin Antibody: <1 IU/mL (ref 0.0–0.9)
Thyroperoxidase Ab SerPl-aCnc: 13 IU/mL (ref 0–34)

## 2020-07-08 LAB — TSH: TSH: 0.739 u[IU]/mL (ref 0.450–4.500)

## 2020-07-08 LAB — T4, FREE: Free T4: 1.11 ng/dL (ref 0.82–1.77)

## 2020-07-08 MED ORDER — AZITHROMYCIN 250 MG PO TABS: ORAL_TABLET | ORAL | 0 refills | Status: DC

## 2020-07-08 NOTE — Telephone Encounter (Signed)
The pt called and said that  her congestion still isn't better, the pt was asked if she has a fever and the pt said no.  The pt was told that Dr. Baird Cancer is sending in a z-pack for the pt to the pharmacy.

## 2020-07-16 LAB — HM MAMMOGRAPHY

## 2020-07-23 ENCOUNTER — Encounter: Payer: Self-pay | Admitting: Internal Medicine

## 2020-07-26 ENCOUNTER — Encounter: Payer: Self-pay | Admitting: Internal Medicine

## 2020-07-26 ENCOUNTER — Other Ambulatory Visit: Payer: Self-pay

## 2020-07-26 ENCOUNTER — Ambulatory Visit (INDEPENDENT_AMBULATORY_CARE_PROVIDER_SITE_OTHER): Payer: 59 | Admitting: Internal Medicine

## 2020-07-26 VITALS — BP 116/78 | HR 97 | Temp 98.0°F | Ht 67.4 in | Wt 208.0 lb

## 2020-07-26 DIAGNOSIS — E049 Nontoxic goiter, unspecified: Secondary | ICD-10-CM

## 2020-07-26 DIAGNOSIS — Z6832 Body mass index (BMI) 32.0-32.9, adult: Secondary | ICD-10-CM

## 2020-07-26 DIAGNOSIS — E6609 Other obesity due to excess calories: Secondary | ICD-10-CM

## 2020-07-26 DIAGNOSIS — I10 Essential (primary) hypertension: Secondary | ICD-10-CM

## 2020-07-26 DIAGNOSIS — J310 Chronic rhinitis: Secondary | ICD-10-CM | POA: Diagnosis not present

## 2020-07-26 DIAGNOSIS — Z0001 Encounter for general adult medical examination with abnormal findings: Secondary | ICD-10-CM

## 2020-07-26 DIAGNOSIS — N898 Other specified noninflammatory disorders of vagina: Secondary | ICD-10-CM

## 2020-07-26 DIAGNOSIS — Z1211 Encounter for screening for malignant neoplasm of colon: Secondary | ICD-10-CM

## 2020-07-26 LAB — POCT URINALYSIS DIPSTICK
Bilirubin, UA: NEGATIVE
Blood, UA: NEGATIVE
Glucose, UA: NEGATIVE
Ketones, UA: NEGATIVE
Leukocytes, UA: NEGATIVE
Nitrite, UA: NEGATIVE
Protein, UA: NEGATIVE
Spec Grav, UA: >=1.03 — AB (ref 1.010–1.025)
Urobilinogen, UA: 0.2 E.U./dL
pH, UA: 5.5 (ref 5.0–8.0)

## 2020-07-26 LAB — POCT UA - MICROALBUMIN
Albumin/Creatinine Ratio, Urine, POC: 30
Creatinine, POC: 300 mg/dL
Microalbumin Ur, POC: 30 mg/L

## 2020-07-26 MED ORDER — IPRATROPIUM BROMIDE 0.03 % NA SOLN: 2.0000 | Freq: Three times a day (TID) | NASAL | 2 refills | Status: DC

## 2020-07-26 NOTE — Patient Instructions (Signed)
Health Maintenance, Female Adopting a healthy lifestyle and getting preventive care are important in promoting health and wellness. Ask your health care provider about:  The right schedule for you to have regular tests and exams.  Things you can do on your own to prevent diseases and keep yourself healthy. What should I know about diet, weight, and exercise? Eat a healthy diet   Eat a diet that includes plenty of vegetables, fruits, low-fat dairy products, and lean protein.  Do not eat a lot of foods that are high in solid fats, added sugars, or sodium. Maintain a healthy weight Body mass index (BMI) is used to identify weight problems. It estimates body fat based on height and weight. Your health care provider can help determine your BMI and help you achieve or maintain a healthy weight. Get regular exercise Get regular exercise. This is one of the most important things you can do for your health. Most adults should:  Exercise for at least 150 minutes each week. The exercise should increase your heart rate and make you sweat (moderate-intensity exercise).  Do strengthening exercises at least twice a week. This is in addition to the moderate-intensity exercise.  Spend less time sitting. Even light physical activity can be beneficial. Watch cholesterol and blood lipids Have your blood tested for lipids and cholesterol at 45 years of age, then have this test every 5 years. Have your cholesterol levels checked more often if:  Your lipid or cholesterol levels are high.  You are older than 45 years of age.  You are at high risk for heart disease. What should I know about cancer screening? Depending on your health history and family history, you may need to have cancer screening at various ages. This may include screening for:  Breast cancer.  Cervical cancer.  Colorectal cancer.  Skin cancer.  Lung cancer. What should I know about heart disease, diabetes, and high blood  pressure? Blood pressure and heart disease  High blood pressure causes heart disease and increases the risk of stroke. This is more likely to develop in people who have high blood pressure readings, are of African descent, or are overweight.  Have your blood pressure checked: ? Every 3-5 years if you are 18-39 years of age. ? Every year if you are 40 years old or older. Diabetes Have regular diabetes screenings. This checks your fasting blood sugar level. Have the screening done:  Once every three years after age 40 if you are at a normal weight and have a low risk for diabetes.  More often and at a younger age if you are overweight or have a high risk for diabetes. What should I know about preventing infection? Hepatitis B If you have a higher risk for hepatitis B, you should be screened for this virus. Talk with your health care provider to find out if you are at risk for hepatitis B infection. Hepatitis C Testing is recommended for:  Everyone born from 1945 through 1965.  Anyone with known risk factors for hepatitis C. Sexually transmitted infections (STIs)  Get screened for STIs, including gonorrhea and chlamydia, if: ? You are sexually active and are younger than 45 years of age. ? You are older than 45 years of age and your health care provider tells you that you are at risk for this type of infection. ? Your sexual activity has changed since you were last screened, and you are at increased risk for chlamydia or gonorrhea. Ask your health care provider if   you are at risk.  Ask your health care provider about whether you are at high risk for HIV. Your health care provider may recommend a prescription medicine to help prevent HIV infection. If you choose to take medicine to prevent HIV, you should first get tested for HIV. You should then be tested every 3 months for as long as you are taking the medicine. Pregnancy  If you are about to stop having your period (premenopausal) and  you may become pregnant, seek counseling before you get pregnant.  Take 400 to 800 micrograms (mcg) of folic acid every day if you become pregnant.  Ask for birth control (contraception) if you want to prevent pregnancy. Osteoporosis and menopause Osteoporosis is a disease in which the bones lose minerals and strength with aging. This can result in bone fractures. If you are 65 years old or older, or if you are at risk for osteoporosis and fractures, ask your health care provider if you should:  Be screened for bone loss.  Take a calcium or vitamin D supplement to lower your risk of fractures.  Be given hormone replacement therapy (HRT) to treat symptoms of menopause. Follow these instructions at home: Lifestyle  Do not use any products that contain nicotine or tobacco, such as cigarettes, e-cigarettes, and chewing tobacco. If you need help quitting, ask your health care provider.  Do not use street drugs.  Do not share needles.  Ask your health care provider for help if you need support or information about quitting drugs. Alcohol use  Do not drink alcohol if: ? Your health care provider tells you not to drink. ? You are pregnant, may be pregnant, or are planning to become pregnant.  If you drink alcohol: ? Limit how much you use to 0-1 drink a day. ? Limit intake if you are breastfeeding.  Be aware of how much alcohol is in your drink. In the [place] one drink equals one 12 oz bottle of beer (355 mL), one 5 oz glass of wine (148 mL), or one 1 oz glass of hard liquor (44 mL). General instructions  Schedule regular health, dental, and eye exams.  Stay current with your vaccines.  Tell your health care provider if: ? You often feel depressed. ? You have ever been abused or do not feel safe at home. Summary  Adopting a healthy lifestyle and getting preventive care are important in promoting health and wellness.  Follow your health care provider's instructions about healthy  diet, exercising, and getting tested or screened for diseases.  Follow your health care provider's instructions on monitoring your cholesterol and blood pressure. This information is not intended to replace advice given to you by your health care provider. Make sure you discuss any questions you have with your health care provider. Document Revised: 10/02/2018 Document Reviewed: 10/02/2018 Elsevier Patient Education  2020 [employer]  

## 2020-07-26 NOTE — Progress Notes (Signed)
I,Katawbba Wiggins,acting as a Education administrator for Maximino Greenland, MD.,have documented all relevant documentation on the behalf of Maximino Greenland, MD,as directed by  Maximino Greenland, MD while in the presence of Maximino Greenland, MD.  This visit occurred during the SARS-CoV-2 public health emergency.  Safety protocols were in place, including screening questions prior to the visit, additional usage of staff PPE, and extensive cleaning of exam room while observing appropriate contact time as indicated for disinfecting solutions.  Subjective:     Patient ID: Deanna Sparks , female    DOB: Oct 03, 1976 , 44 y.o.   MRN: [id number]   Chief Complaint  Patient presents with  . Annual Exam  . Hypertension    HPI  The patient is here for a physical examination. She is followed by GYN for her pelvic exams.  She was last seen by Dr. Benson Setting at [facility]. The patient would also like to be checked for bacteria vaginosis.   Hypertension This is a chronic problem. The current episode started more than 1 year ago. The problem has been gradually improving since onset. The problem is controlled. Pertinent negatives include no anxiety, blurred vision, palpitations or shortness of breath. Past treatments include lifestyle changes. The current treatment provides moderate improvement.     Past Medical History:  Diagnosis Date  . Genital herpes    last outbreak 12/2011  . Hypertension      Family History  Problem Relation Age of Onset  . Hypertension Mother   . Healthy Father   . Anesthesia problems Neg Hx   . Other Neg Hx      Current Outpatient Medications:  .  acyclovir (ZOVIRAX) 400 MG tablet, TAKE 1 TABLET(400 MG) BY MOUTH TWICE DAILY, Disp: 120 tablet, Rfl: 2 .  lisinopril-hydrochlorothiazide (PRINZIDE,ZESTORETIC) 20-12.5 MG tablet, Take 1 tablet by mouth daily., Disp: 90 tablet, Rfl: 1 .  ipratropium (ATROVENT) 0.03 % nasal spray, Place 2 sprays into the nose 3 (three) times daily., Disp: 30  mL, Rfl: 2   Allergies  Allergen Reactions  . Codeine Itching  . Ultram [Tramadol] Itching      The patient states she uses none for birth control. Last LMP was Patient's last menstrual period was 07/02/2020 (exact date).. Negative for Dysmenorrhea. Negative for: breast discharge, breast lump(s), breast pain and breast self exam. Associated symptoms include abnormal vaginal bleeding. Pertinent negatives include abnormal bleeding (hematology), anxiety, decreased libido, depression, difficulty falling sleep, dyspareunia, history of infertility, nocturia, sexual dysfunction, sleep disturbances, urinary incontinence, urinary urgency, vaginal discharge and vaginal itching. Diet regular.The patient states her exercise level is  intermittent.  . The patient's tobacco use is:  Social History   Tobacco Use  Smoking Status Never Smoker  Smokeless Tobacco Never Used  . She has been exposed to passive smoke. The patient's alcohol use is:  Social History   Substance and Sexual Activity  Alcohol Use Yes   Comment: occasionally  \  Review of Systems  Constitutional: Negative.   HENT: Negative.        She c/o congestion that is worsened by certain smells. Certain smells can also make her nose runny. She has not been using any nasal sprays.   Eyes: Negative.  Negative for blurred vision.  Respiratory: Negative.  Negative for shortness of breath.   Cardiovascular: Negative.  Negative for palpitations.  Gastrointestinal: Negative.   Endocrine: Negative.   Genitourinary: Negative.        She c/o vaginal odor. She denies vaginal  discharge. Recently had STD testing with her GYN.  Musculoskeletal: Negative.   Skin: Negative.   Allergic/Immunologic: Negative.   Neurological: Negative.   Hematological: Negative.   Psychiatric/Behavioral: Negative.      Today's Vitals   07/26/20 1045  BP: 116/78  Pulse: 97  Temp: 98 F (36.7 C)  TempSrc: Oral  Weight: 208 lb (94.3 kg)  Height: 5' 7.4" (1.712  m)   Body mass index is 32.19 kg/m.  Wt Readings from Last 3 Encounters:  07/26/20 208 lb (94.3 kg)  07/07/20 210 lb (95.3 kg)  01/13/20 208 lb 3.2 oz (94.4 kg)   Objective:  Physical Exam Vitals and nursing note reviewed. Exam conducted with a chaperone present.  Constitutional:      Appearance: Normal appearance. She is obese.  HENT:     Head: Normocephalic and atraumatic.     Right Ear: Tympanic membrane, ear canal and external ear normal.     Left Ear: Tympanic membrane, ear canal and external ear normal.     Nose:     Comments: Deferred, masked    Mouth/Throat:     Comments: Deferred, masked Eyes:     Extraocular Movements: Extraocular movements intact.     Conjunctiva/sclera: Conjunctivae normal.     Pupils: Pupils are equal, round, and reactive to light.  Neck:     Thyroid: Thyromegaly present.  Cardiovascular:     Rate and Rhythm: Normal rate and regular rhythm.     Pulses: Normal pulses.     Heart sounds: Normal heart sounds.  Pulmonary:     Effort: Pulmonary effort is normal.     Breath sounds: Normal breath sounds.  Chest:     Breasts: Tanner Score is 5.        Right: Normal.        Left: Normal.  Abdominal:     General: Abdomen is flat. Bowel sounds are normal.     Palpations: Abdomen is soft.     Hernia: There is no hernia in the left inguinal area or right inguinal area.  Genitourinary:    General: Normal vulva.     Exam position: Lithotomy position.     Vagina: Vaginal discharge (for about 4 days) present.  Musculoskeletal:        General: Normal range of motion.     Cervical back: Normal range of motion and neck supple.  Skin:    General: Skin is warm and dry.  Neurological:     General: No focal deficit present.     Mental Status: She is alert and oriented to person, place, and time.  Psychiatric:        Mood and Affect: Mood normal.        Behavior: Behavior normal.         Assessment And Plan:     1. Encounter for general adult  medical examination with abnormal findings Comments: A full exam was performed. Importance of monthly self breast exams was discussed with the patient. PATIENT IS ADVISED TO GET 30-45 MINUTES REGULAR EXERCISE NO LESS THAN FOUR TO FIVE DAYS PER WEEK - BOTH WEIGHTBEARING EXERCISES AND AEROBIC ARE RECOMMENDED.  PATIENT IS ADVISED TO FOLLOW A HEALTHY DIET WITH AT LEAST SIX FRUITS/VEGGIES PER DAY, DECREASE INTAKE OF RED MEAT, AND TO INCREASE FISH INTAKE TO TWO DAYS PER WEEK.  MEATS/FISH SHOULD NOT BE FRIED, BAKED OR BROILED IS PREFERABLE.  I SUGGEST WEARING SPF 50 SUNSCREEN ON EXPOSED PARTS AND ESPECIALLY WHEN IN THE DIRECT SUNLIGHT FOR  AN EXTENDED PERIOD OF TIME.  PLEASE AVOID FAST FOOD RESTAURANTS AND INCREASE YOUR WATER INTAKE.  - Hepatitis C antibody - VITAMIN D 25 Hydroxy (Vit-D Deficiency, Fractures) - CBC - CMP14+EGFR - Lipid panel - HIV antibody (with reflex)  2. Essential hypertension Comments: Chronic, well controlled. She will continue with current meds. She is encouraged to avoid adding salt to her foods. EKG performed, NSR w/o acute changes. She will rto in six months for re-evaluation.  - POCT UA - Microalbumin - EKG 12-Lead - POCT Urinalysis Dipstick (81002)  3. Goiter Comments: I will refer her to Endocrinology for further evaluation.  - Ambulatory referral to Endocrinology  4. Chronic rhinitis Comments: Her sx are suggestive of vasomotor rhinitis. I will send in rx Atrovent NS. She will let me know if her sx improve.  - ipratropium (ATROVENT) 0.03 % nasal spray; Place 2 sprays into the nose 3 (three) times daily.  Dispense: 30 mL; Refill: 2  5. Vaginal odor Comments: I will send Nuswab culture. - NuSwab Vaginitis Plus (VG+)  6. Class 1 obesity due to excess calories without serious comorbidity with body mass index (BMI) of 32.0 to 32.9 in adult Comments: She is encouraged to strive for BMI less than 30 to decrease cardiac risk. Advised to aim for at least 150 minutes of  exercise per week.   7. Screening for colon cancer Comments: I will refer her to GI for CRC screening.  - Ambulatory referral to Gastroenterology     Patient was given opportunity to ask questions. Patient verbalized understanding of the plan and was able to repeat key elements of the plan. All questions were answered to their satisfaction.   Maximino Greenland, MD   I, Maximino Greenland, MD, have reviewed all documentation for this visit. The documentation on 07/26/20 for the exam, diagnosis, procedures, and orders are all accurate and complete.  THE PATIENT IS ENCOURAGED TO PRACTICE SOCIAL DISTANCING DUE TO THE COVID-19 PANDEMIC.

## 2020-07-27 LAB — CMP14+EGFR
ALT: 18 IU/L (ref 0–32)
AST: 12 IU/L (ref 0–40)
Albumin/Globulin Ratio: 1.5 (ref 1.2–2.2)
Albumin: 4.2 g/dL (ref 3.8–4.8)
Alkaline Phosphatase: 60 IU/L (ref 44–121)
BUN/Creatinine Ratio: 17 (ref 9–23)
BUN: 14 mg/dL (ref 6–24)
Bilirubin Total: 0.5 mg/dL (ref 0.0–1.2)
CO2: 23 mmol/L (ref 20–29)
Calcium: 9.3 mg/dL (ref 8.7–10.2)
Chloride: 108 mmol/L — ABNORMAL HIGH (ref 96–106)
Creatinine, Ser: 0.83 mg/dL (ref 0.57–1.00)
GFR calc Af Amer: 98 mL/min/{1.73_m2} (ref >59)
GFR calc non Af Amer: 85 mL/min/{1.73_m2} (ref >59)
Globulin, Total: 2.8 g/dL (ref 1.5–4.5)
Glucose: 91 mg/dL (ref 65–99)
Potassium: 4.1 mmol/L (ref 3.5–5.2)
Sodium: 143 mmol/L (ref 134–144)
Total Protein: 7 g/dL (ref 6.0–8.5)

## 2020-07-27 LAB — CBC
Hematocrit: 41.7 % (ref 34.0–46.6)
Hemoglobin: 14 g/dL (ref 11.1–15.9)
MCH: 30.4 pg (ref 26.6–33.0)
MCHC: 33.6 g/dL (ref 31.5–35.7)
MCV: 91 fL (ref 79–97)
Platelets: 308 10*3/uL (ref 150–450)
RBC: 4.6 x10E6/uL (ref 3.77–5.28)
RDW: 12 % (ref 11.7–15.4)
WBC: 9.3 10*3/uL (ref 3.4–10.8)

## 2020-07-27 LAB — HEPATITIS C ANTIBODY: Hep C Virus Ab: <0.1 s/co ratio (ref 0.0–0.9)

## 2020-07-27 LAB — LIPID PANEL
Chol/HDL Ratio: 3.5 ratio (ref 0.0–4.4)
Cholesterol, Total: 171 mg/dL (ref 100–199)
HDL: 49 mg/dL (ref >39)
LDL Chol Calc (NIH): 105 mg/dL — ABNORMAL HIGH (ref 0–99)
Triglycerides: 95 mg/dL (ref 0–149)
VLDL Cholesterol Cal: 17 mg/dL (ref 5–40)

## 2020-07-27 LAB — VITAMIN D 25 HYDROXY (VIT D DEFICIENCY, FRACTURES): Vit D, 25-Hydroxy: 16.9 ng/mL — ABNORMAL LOW (ref 30.0–100.0)

## 2020-07-27 LAB — HIV ANTIBODY (ROUTINE TESTING W REFLEX): HIV Screen 4th Generation wRfx: NONREACTIVE

## 2020-07-28 ENCOUNTER — Other Ambulatory Visit: Payer: Self-pay

## 2020-07-28 LAB — NUSWAB VAGINITIS PLUS (VG+)
Candida albicans, NAA: NEGATIVE
Candida glabrata, NAA: NEGATIVE
Chlamydia trachomatis, NAA: NEGATIVE
Neisseria gonorrhoeae, NAA: NEGATIVE
Trich vag by NAA: NEGATIVE

## 2020-07-28 MED ORDER — VITAMIN D3 1.25 MG (50000 UT) PO CAPS: ORAL_CAPSULE | ORAL | 1 refills | Status: DC

## 2020-09-01 ENCOUNTER — Ambulatory Visit: Payer: 59 | Admitting: Allergy

## 2020-09-07 ENCOUNTER — Ambulatory Visit: Payer: 59 | Admitting: Internal Medicine

## 2020-09-08 ENCOUNTER — Other Ambulatory Visit: Payer: Self-pay

## 2020-09-08 MED ORDER — LISINOPRIL-HYDROCHLOROTHIAZIDE 20-12.5 MG PO TABS: 1.0000 | ORAL_TABLET | Freq: Every day | ORAL | 4 refills | Status: DC

## 2020-09-10 ENCOUNTER — Ambulatory Visit: Payer: 59 | Admitting: Podiatry

## 2020-09-24 ENCOUNTER — Other Ambulatory Visit: Payer: Self-pay

## 2020-09-24 ENCOUNTER — Ambulatory Visit (INDEPENDENT_AMBULATORY_CARE_PROVIDER_SITE_OTHER): Payer: 59 | Admitting: Internal Medicine

## 2020-09-24 ENCOUNTER — Encounter: Payer: Self-pay | Admitting: Internal Medicine

## 2020-09-24 VITALS — BP 170/98 | HR 81 | Ht 68.0 in | Wt 212.2 lb

## 2020-09-24 DIAGNOSIS — E042 Nontoxic multinodular goiter: Secondary | ICD-10-CM | POA: Diagnosis not present

## 2020-09-24 NOTE — Progress Notes (Signed)
Patient ID: Deanna Sparks, female   DOB: 03/20/1976, 44 y.o.   MRN: [id number]   This visit occurred during the SARS-CoV-2 public health emergency.  Safety protocols were in place, including screening questions prior to the visit, additional usage of staff PPE, and extensive cleaning of exam room while observing appropriate contact time as indicated for disinfecting solutions.   HPI  Deanna Sparks is a 44 y.o.-year-old female, referred by her PCP, Dr. Baird Cancer, for evaluation for multinodular goiter.  Patient was found to have a goiter on exam approximately 2 years ago.  She had a thyroid ultrasound at that time which showed several small thyroid nodules, of which 2 were dominant.  One of them qualify for 1 year follow-up.  A repeat ultrasound obtained in 12/2019 showed small nodules 1 of which none met criteria for biopsy or follow-up.  Thyroid U/S (06/19/2019): Parenchymal Echotexture: Mildly heterogenous Isthmus: 5 mm  Right lobe: 5.7 x 1.7 x 2.3 cm  Left lobe: 5.3 x 1.7 x 2.3  _________________________________________________________   Estimated total number of nodules >/= 1 cm: 2   Number of spongiform nodules >/= 2 cm not described below (TR1): 0   Number of mixed cystic and solid nodules >/= 1.5 cm not described  below (Halfway): 0  _________________________________________________________   Nodule # 1:  Location: Right; Inferior  Maximum size: 1.3 cm; Other 2 dimensions: 0.6 x 1.0 cm  Composition: solid/almost completely solid (2)  Echogenicity: isoechoic (1)  Shape: not taller-than-wide (0)  Margins: ill-defined (0)  Echogenic foci: none (0)  ACR TI-RADS total points: 3.  ACR TI-RADS recommendations:  Given size (<1.4 cm) and appearance, this nodule does NOT meet  TI-RADS criteria for biopsy or dedicated follow-up.  _________________________________________________________   Nodule # 2:  Location: Left; Mid  Maximum size: 1.6 cm; Other 2 dimensions: 0.8 x 1.4 cm   Composition: solid/almost completely solid (2)  Echogenicity: isoechoic (1)  Shape: not taller-than-wide (0)  Margins: ill-defined (0)  Echogenic foci: none (0)  ACR TI-RADS total points: 3.  ACR TI-RADS recommendations:  Given size (>/= 1.5 - 2.4 cm) and appearance, a follow-up  ultrasound in 1 year should be considered based on TI-RADS criteria.  _________________________________________________________   Minor gland heterogeneity. Normal vascularity. No regional adenopathy.   IMPRESSION:  1.3 cm right inferior thyroid TR 3 nodule does not meet criteria for biopsy or follow-up.  1.6 cm left mid thyroid TR 3 nodule meets criteria for follow-up in 1 year.    Thyroid U/S (01/21/2020): Parenchymal Echotexture: Mildly heterogenous Isthmus: 0.4 cm Right lobe: 5.4 x 2 x 2.6 c Left lobe: 5 x 1.6 x 2.2 cm _________________________________________________________  Estimated total number of nodules >/= 1 cm: 3 Number of spongiform nodules >/=  2 cm not described below (TR1): 0 Number of mixed cystic and solid nodules >/= 1.5 cm not described below (TR2): 0 _________________________________________________________  There is a small 0.8 cm hypoechoic thyroid nodule in the right mid thyroid gland. No further follow-up is required.  There is a 1.2 cm isoechoic thyroid nodule in the left mid thyroid gland. No further follow-up is required for this thyroid nodule.  There is a 1.4 cm isoechoic thyroid nodule in the left mid thyroid gland/isthmus. No further follow-up is required.  Nodule # 2: Location: Right; Inferior Maximum size: 1.4 cm; Other 2 dimensions: 1.3 x 0.7 cm Composition: solid/almost completely solid (2) Echogenicity: isoechoic (1) Shape: not taller-than-wide (0) Margins: ill-defined (0) Echogenic foci: none (0) ACR TI-RADS  total points: 3. ACR TI-RADS recommendations: Given size (<1.4 cm) and appearance, this nodule does NOT meet TI-RADS criteria for biopsy or  dedicated follow-up. _________________________________________________________  No concerning pathologically enlarged cervical lymph nodes were identified.  IMPRESSION: Mildly heterogeneous, mildly enlarged thyroid gland with multiple small thyroid nodules, none of which meet criteria for follow-up or fine-needle aspiration.  Pt denies: - feeling nodules in neck - hoarseness - choking - SOB with lying down But she occasionally has dysphagia with food.  This is not consistent, though.  I reviewed pt's thyroid tests: Lab Results  Component Value Date   TSH 0.739 07/07/2020   TSH 0.930 10/10/2018   FREET4 1.11 07/07/2020   FREET4 1.27 10/10/2018    Pt denies: - fatigue - heat intolerance/cold intolerance - tremors - palpitations - anxiety/depression - hyperdefecation/constipation - dry skin - hair loss  She did have a weight loss of 14 pounds recently after stopping cookies and chips.  However, per our scale, weight is approximately stable.  + FH of thyroid ds.in brother  - hyperthyroidism. No FH of thyroid cancer. No h/o radiation tx to head or neck.  No seaweed or kelp. No recent contrast studies. + steroid use - Kenalog in hip 07/07/2020. No herbal supplements. No Biotin supplements or Hair, Skin and Nails vitamins.  ROS: Constitutional: + See HPI, + nocturia and increased urination on HCTZ Eyes: no blurry vision, no xerophthalmia ENT: no sore throat,  + see HPI Cardiovascular: no CP/SOB/palpitations/leg swelling Respiratory: no cough/SOB Gastrointestinal: no N/V/D/C Musculoskeletal: no muscle/joint aches Skin: no rashes Neurological: no tremors/numbness/tingling/dizziness Psychiatric: no depression/anxiety  Past Medical History:  Diagnosis Date  . Genital herpes    last outbreak 12/2011  . Hypertension    Past Surgical History:  Procedure Laterality Date  . CESAREAN SECTION    . LIPOMA EXCISION  2008   removed from forehead   Social History    Socioeconomic History  . Marital status: Single    Spouse name: Not on file  . Number of children: 3  . Years of education: Not on file  . Highest education level: Not on file  Occupational History  . Occupation: CMA  Tobacco Use  . Smoking status: Never Smoker  . Smokeless tobacco: Never Used  Vaping Use  . Vaping Use: Never used  Substance and Sexual Activity  . Alcohol use: Yes    Comment: occasionally  . Drug use: Never  . Sexual activity: Not on file  Other Topics Concern  . Not on file  Social History Narrative  . Not on file   Social Determinants of Health   Financial Resource Strain:   . Difficulty of Paying Living Expenses: Not on file  Food Insecurity:   . Worried About Charity fundraiser in the Last Year: Not on file  . Ran Out of Food in the Last Year: Not on file  Transportation Needs:   . Lack of Transportation (Medical): Not on file  . Lack of Transportation (Non-Medical): Not on file  Physical Activity:   . Days of Exercise per Week: Not on file  . Minutes of Exercise per Session: Not on file  Stress:   . Feeling of Stress : Not on file  Social Connections:   . Frequency of Communication with Friends and Family: Not on file  . Frequency of Social Gatherings with Friends and Family: Not on file  . Attends Religious Services: Not on file  . Active Member of Clubs or Organizations: Not on file  .  Attends Archivist Meetings: Not on file  . Marital Status: Not on file  Intimate Partner Violence:   . Fear of Current or Ex-Partner: Not on file  . Emotionally Abused: Not on file  . Physically Abused: Not on file  . Sexually Abused: Not on file   Current Outpatient Medications on File Prior to Visit  Medication Sig Dispense Refill  . acyclovir (ZOVIRAX) 400 MG tablet TAKE 1 TABLET(400 MG) BY MOUTH TWICE DAILY 120 tablet 2  . ipratropium (ATROVENT) 0.03 % nasal spray Place 2 sprays into the nose 3 (three) times daily. 30 mL 2  .  lisinopril-hydrochlorothiazide (ZESTORETIC) 20-12.5 MG tablet Take 1 tablet by mouth daily. 30 tablet 4  . Cholecalciferol (VITAMIN D3) 1.25 MG (50000 UT) CAPS Take 1 capsule by mouth on Tuesday and Friday (Patient not taking: Reported on 09/24/2020) 24 capsule 1   No current facility-administered medications on file prior to visit.   Allergies  Allergen Reactions  . Codeine Itching  . Ultram [Tramadol] Itching   Family History  Problem Relation Age of Onset  . Hypertension Mother   . Healthy Father   . Thyroid disease Brother   . Anesthesia problems Neg Hx   . Other Neg Hx    PE: BP (!) 170/98   Pulse 81   Ht 5' 8"  (1.727 m)   Wt 212 lb 3.2 oz (96.3 kg)   SpO2 99%   BMI 32.26 kg/m  Wt Readings from Last 3 Encounters:  09/24/20 212 lb 3.2 oz (96.3 kg)  07/26/20 208 lb (94.3 kg)  07/07/20 210 lb (95.3 kg)   Constitutional: overweight, in NAD Eyes: PERRLA, EOMI, no exophthalmos ENT: moist mucous membranes, + slight symmetric thyromegaly, but developed sternocleidomastoid muscles, no cervical lymphadenopathy Cardiovascular: RRR, No MRG Respiratory: CTA B Gastrointestinal: abdomen soft, NT, ND, BS+ Musculoskeletal: no deformities, strength intact in all 4;  Skin: moist, warm, no rashes Neurological: no tremor with outstretched hands, DTR normal in all 4  ASSESSMENT: 1.  Multi-nodular goiter  PLAN: 1.  Multi-nodular goiter - I reviewed the report of her thyroid ultrasound from 2020 and the report and images of the latest thyroid ultrasound from 12/2019 along with the patient. I pointed out that the thyroid appears only mildly enlarged, with a mildly heterogeneous aspect, suggestive of inflammation.  However, it does not have increased blood flow, so inflammation appears to have been resolved.  The thyroid contains several nodules, with the largest being 1.4 cm, situated in the right inferior lobe.  However, these did not appear to be worrisome: The nodules are: - small -  Only one is hypoechoic, but this is very small, measuring 0.8 cm and it appears cystic, being very low risk; the rest of the nodules are isoechoic - without microcalcifications - without internal blood flow - more wide than tall - well delimited from surrounding tissue Pt does not have a thyroid cancer family history or a personal history of RxTx to head/neck. All these would favor benignity.  -We reviewed together her TFTs from 2019-2021 and they have been normal.  Also, her TPO and ATA antibodies were normal at that time, so no signs of Hashimoto's thyroiditis -She denies neck compression symptoms except for occasional dysphagia with food.  We discussed that if this intensifies or if she starts having more pressure in her neck, we can proceed with thyroidectomy, however, this is not indicated in the absence of significant distress. -As of now, no intervention or follow-up is needed  for her goiter -I advised her to return to see me as needed  Philemon Kingdom, MD PhD [facility] Endocrinology

## 2020-09-24 NOTE — Patient Instructions (Signed)
Pease return to see me as needed.   Goiter  A goiter is an enlarged thyroid gland. The thyroid is located in the lower front of the neck. It makes hormones that affect many body parts and systems, including the system that affects how quickly the body burns fuel for energy (metabolism). Most goiters are painless and are not a cause for concern. Some goiters can affect the way your thyroid makes thyroid hormones. Goiters and conditions that cause goiters can be treated, if necessary. What are the causes? Common causes of this condition include:  Lack (deficiency) of a mineral called iodine. The thyroid gland uses iodine to make thyroid hormones.  Diseases that attack healthy cells in the body (autoimmune diseases) and affect thyroid function, such as Graves' disease or Hashimoto's disease. These diseases may cause the body to produce too much thyroid hormone (hyperthyroidism) or too little of the hormone (hypothyroidism).  Conditions that cause inflammation of the thyroid (thyroiditis).  One or more small growths on the thyroid (nodular goiter). Other causes include:  Medical problems caused by abnormal genes that are passed from parent to child (genetic defects).  Thyroid injury or infection.  Tumors that may or may not be cancerous.  Pregnancy.  Certain medicines.  Exposure to radiation. In some cases, the cause may not be known. What increases the risk? This condition is more likely to develop in:  People who do not get enough iodine in their diet.  People who have a family history of goiter.  Women.  People who are older than age 24.  People who smoke tobacco.  People who have had exposure to radiation. What are the signs or symptoms? The main symptom of this condition is swelling in the lower, front part of the neck. This swelling can range from a very small bump to a large lump. Other symptoms may include:  A tight feeling in the throat.  A hoarse  voice.  Coughing.  Wheezing.  Difficulty swallowing or breathing.  Bulging veins in the neck.  Dizziness. When a goiter is the result of an overactive thyroid (hyperthyroidism), symptoms may also include:  Nervousness or restlessness.  Inability to tolerate heat.  Unexplained weight loss.  Diarrhea.  Change in the texture of hair or skin.  Changes in heartbeat, such as skipped beats, extra beats, or a rapid heart rate.  Loss of menstruation.  Shaky hands.  Increased appetite.  Sleep problems. When a goiter is the result of an underactive thyroid (hypothyroidism), symptoms may also include:  Feeling like you have no energy (lethargy).  Inability to tolerate cold.  Weight gain that is not explained by a change in diet or exercise habits.  Dry skin.  Coarse hair.  Irregular menstrual periods.  Constipation.  Sadness or depression.  Fatigue. In some cases, there may not be any symptoms and the thyroid hormone levels may be normal. How is this diagnosed? This condition may be diagnosed based on your symptoms, your medical history, and a physical exam. You may have tests, such as:  Blood tests to check thyroid function.  Imaging tests, such as: ? Ultrasound. ? CT scan. ? MRI. ? Thyroid scan.  Removal of a tissue sample (biopsy) of the goiter or any nodules. The sample will be tested to check for cancer. How is this treated? Treatment for this condition depends on the cause and your symptoms. Treatment may include:  Medicines to regulate thyroid hormone levels.  Anti-inflammatory medicines or steroid medicines, if the goiter is caused  by inflammation.  Iodine supplements or changes to your diet, if the goiter is caused by iodine deficiency.  Radioactive iodine treatment.  Surgery to remove your thyroid. In some cases, you may only need regular check-ups with your health care provider to monitor your condition, and you may not need  treatment. Follow these instructions at home:  Follow instructions from your health care provider about any changes to your diet.  Take over-the-counter and prescription medicines only as told by your health care provider. These include supplements.  Do not use any products that contain nicotine or tobacco, such as cigarettes and e-cigarettes. If you need help quitting, ask your health care provider.  Keep all follow-up visits as told by your health care provider. This is important. Contact a health care provider if:  Your symptoms do not get better with treatment.  You have nausea, vomiting, or diarrhea. Get help right away if:  You have sudden, unexplained confusion or other mental changes.  You have a fever.  You have chest pain.  You have trouble breathing or swallowing.  You suddenly become very weak.  You experience extreme restlessness.  You feel your heart racing. Summary  A goiter is an enlarged thyroid gland.  The thyroid gland is located in the lower front of the neck. It makes hormones that affect many body parts and systems, including the system that affects how quickly the body burns fuel for energy (metabolism).  The main symptom of this condition is swelling in the lower, front part of the neck. This swelling can range from a very small bump to a large lump.  Treatment for this condition depends on the cause and your symptoms. You may need medicines, supplements, or regular monitoring of your condition. This information is not intended to replace advice given to you by your health care provider. Make sure you discuss any questions you have with your health care provider. Document Revised: 09/21/2017 Document Reviewed: 07/05/2017 Elsevier Patient Education  2020 [employer].

## 2020-10-26 ENCOUNTER — Other Ambulatory Visit: Payer: Self-pay

## 2020-10-26 MED ORDER — LISINOPRIL-HYDROCHLOROTHIAZIDE 20-12.5 MG PO TABS: 1.0000 | ORAL_TABLET | Freq: Every day | ORAL | 4 refills | Status: DC

## 2020-10-26 MED ORDER — ACYCLOVIR 400 MG PO TABS: ORAL_TABLET | ORAL | 2 refills | Status: DC

## 2021-01-24 ENCOUNTER — Other Ambulatory Visit: Payer: Self-pay

## 2021-01-24 ENCOUNTER — Encounter: Payer: Self-pay | Admitting: Internal Medicine

## 2021-01-24 ENCOUNTER — Ambulatory Visit (INDEPENDENT_AMBULATORY_CARE_PROVIDER_SITE_OTHER): Payer: 59 | Admitting: Internal Medicine

## 2021-01-24 VITALS — BP 142/96 | HR 82 | Temp 98.2°F | Ht 68.0 in | Wt 216.0 lb

## 2021-01-24 DIAGNOSIS — H538 Other visual disturbances: Secondary | ICD-10-CM

## 2021-01-24 DIAGNOSIS — E6609 Other obesity due to excess calories: Secondary | ICD-10-CM

## 2021-01-24 DIAGNOSIS — I10 Essential (primary) hypertension: Secondary | ICD-10-CM

## 2021-01-24 DIAGNOSIS — E049 Nontoxic goiter, unspecified: Secondary | ICD-10-CM

## 2021-01-24 DIAGNOSIS — Z6832 Body mass index (BMI) 32.0-32.9, adult: Secondary | ICD-10-CM

## 2021-01-24 DIAGNOSIS — R0683 Snoring: Secondary | ICD-10-CM | POA: Diagnosis not present

## 2021-01-24 DIAGNOSIS — R635 Abnormal weight gain: Secondary | ICD-10-CM

## 2021-01-24 DIAGNOSIS — Z1211 Encounter for screening for malignant neoplasm of colon: Secondary | ICD-10-CM

## 2021-01-24 MED ORDER — ACYCLOVIR 400 MG PO TABS: ORAL_TABLET | ORAL | 2 refills | Status: DC

## 2021-01-24 NOTE — Progress Notes (Signed)
I,Katawbba Wiggins,acting as a Education administrator for Maximino Greenland, MD.,have documented all relevant documentation on the behalf of Maximino Greenland, MD,as directed by  Maximino Greenland, MD while in the presence of Maximino Greenland, MD.  This visit occurred during the SARS-CoV-2 public health emergency.  Safety protocols were in place, including screening questions prior to the visit, additional usage of staff PPE, and extensive cleaning of exam room while observing appropriate contact time as indicated for disinfecting solutions.  Subjective:     Patient ID: Deanna Sparks , female    DOB: [birth year] , 45 y.o.   MRN: [id number]   Chief Complaint  Patient presents with  . Hypertension    HPI  The patient is here today for a blood pressure f/u.  She admits her BP is elevated at times. She states she can "tell" when elevated. She does not always check her BP on a regular basis. She admits she is not exercising regularly.   Hypertension This is a chronic problem. The current episode started more than 1 year ago. The problem has been gradually improving since onset. The problem is controlled. Pertinent negatives include no anxiety, blurred vision, palpitations or shortness of breath.     Past Medical History:  Diagnosis Date  . Genital herpes    last outbreak 12/2011  . Hypertension      Family History  Problem Relation Age of Onset  . Hypertension Mother   . Healthy Father   . Thyroid disease Brother   . Anesthesia problems Neg Hx   . Other Neg Hx      Current Outpatient Medications:  .  ipratropium (ATROVENT) 0.03 % nasal spray, Place 2 sprays into the nose 3 (three) times daily., Disp: 30 mL, Rfl: 2 .  lisinopril-hydrochlorothiazide (ZESTORETIC) 20-12.5 MG tablet, Take 1 tablet by mouth daily., Disp: 30 tablet, Rfl: 4 .  acyclovir (ZOVIRAX) 400 MG tablet, TAKE 1 TABLET(400 MG) BY MOUTH TWICE DAILY, Disp: 360 tablet, Rfl: 2 .  Cholecalciferol (VITAMIN D3) 1.25 MG (50000 UT) CAPS, Take  1 capsule by mouth on Tuesday and Friday (Patient not taking: No sig reported), Disp: 24 capsule, Rfl: 1   Allergies  Allergen Reactions  . Codeine Itching  . Ultram [Tramadol] Itching     Review of Systems  Constitutional: Negative.   HENT:       She c/o visual changes. States her vision is more blurry. States she has difficulty reading signs that are farther away.   Eyes: Negative for blurred vision.  Respiratory: Negative.  Negative for shortness of breath.   Cardiovascular: Negative.  Negative for palpitations.  Gastrointestinal: Negative.   Psychiatric/Behavioral: Negative.   All other systems reviewed and are negative.    Today's Vitals   01/24/21 0841  BP: (!) 142/96  Pulse: 82  Temp: 98.2 F (36.8 C)  TempSrc: Oral  Weight: 216 lb (98 kg)  Height: 5' 8"  (1.727 m)   Body mass index is 32.84 kg/m.  Wt Readings from Last 3 Encounters:  01/24/21 216 lb (98 kg)  09/24/20 212 lb 3.2 oz (96.3 kg)  07/26/20 208 lb (94.3 kg)   BP Readings from Last 3 Encounters:  01/24/21 (!) 142/96  09/24/20 (!) 170/98  07/26/20 116/78    Objective:  Physical Exam Vitals and nursing note reviewed.  Constitutional:      Appearance: Normal appearance. She is obese.  HENT:     Head: Normocephalic and atraumatic.     Nose:  Comments: Masked     Mouth/Throat:     Comments: Masked  Eyes:     Extraocular Movements: Extraocular movements intact.  Neck:     Thyroid: Thyromegaly present.     Comments: goiter Cardiovascular:     Rate and Rhythm: Normal rate and regular rhythm.     Heart sounds: Normal heart sounds.  Pulmonary:     Effort: Pulmonary effort is normal.     Breath sounds: Normal breath sounds.  Musculoskeletal:     Cervical back: Normal range of motion.  Skin:    General: Skin is warm.  Neurological:     General: No focal deficit present.     Mental Status: She is alert.  Psychiatric:        Mood and Affect: Mood normal.        Behavior: Behavior normal.          Assessment And Plan:     1. Essential hypertension Comments: Uncontrolled. I will add amlodipine 2.69m nightly. She agrees to rto in 2 weeks for a nurse visit. Encouraged to decrease her salt intake and increase exercise.  I will see her back in six weeks. Encouraged to also read labels and to avoid seasoning her foods with salt.  - CMP14+EGFR  2. Blurred vision, bilateral Comments: I will refer her to Ophthalmology for further evaluation. Advised to try readers as well.  - Ambulatory referral to Ophthalmology - CMP14+EGFR - Hemoglobin A1c  3. Snoring Comments: She does admit to snoring. Also c/o daytime fatigue, nocturia.  She agrees to Neuro referral for sleep study evaluation.  - Ambulatory referral to Neurology  4. Goiter Comments: Endo notes reviewed, previous u/s results reviewed. She would like to repeat thyroid u/s - she is concerned b/c difficulty swallowing in the mornings.   5. Weight gain Comments: She has gained 8 lbs since October 2021. Importance of regular exercise was discussed in detail with the patient. - Hemoglobin A1c - TSH  6. Class 1 obesity due to excess calories without serious comorbidity with body mass index (BMI) of 32.0 to 32.9 in adult Comments: She is encouraged to initially strive for bmi less than 30 to decrease cardiac risk Advised to aim for at least 150 minutes of exercise per week.   7. Special screening for malignant neoplasm of colon She agrees to GI referral for CRC screening.  - Ambulatory referral to Gastroenterology  Patient was given opportunity to ask questions. Patient verbalized understanding of the plan and was able to repeat key elements of the plan. All questions were answered to their satisfaction.   I, RMaximino Greenland MD, have reviewed all documentation for this visit. The documentation on 01/24/21 for the exam, diagnosis, procedures, and orders are all accurate and complete.   IF YOU HAVE BEEN REFERRED TO A  SPECIALIST, IT MAY TAKE 1-2 WEEKS TO SCHEDULE/PROCESS THE REFERRAL. IF YOU HAVE NOT HEARD FROM US/SPECIALIST IN TWO WEEKS, PLEASE GIVE [place] CALL AT [phone].   THE PATIENT IS ENCOURAGED TO PRACTICE SOCIAL DISTANCING DUE TO THE COVID-19 PANDEMIC.

## 2021-01-24 NOTE — Patient Instructions (Signed)

## 2021-01-25 LAB — CMP14+EGFR
ALT: 19 IU/L (ref 0–32)
AST: 19 IU/L (ref 0–40)
Albumin/Globulin Ratio: 1.5 (ref 1.2–2.2)
Albumin: 4.3 g/dL (ref 3.8–4.8)
Alkaline Phosphatase: 61 IU/L (ref 44–121)
BUN/Creatinine Ratio: 16 (ref 9–23)
BUN: 14 mg/dL (ref 6–24)
Bilirubin Total: 0.7 mg/dL (ref 0.0–1.2)
CO2: 24 mmol/L (ref 20–29)
Calcium: 9.7 mg/dL (ref 8.7–10.2)
Chloride: 101 mmol/L (ref 96–106)
Creatinine, Ser: 0.85 mg/dL (ref 0.57–1.00)
Globulin, Total: 2.8 g/dL (ref 1.5–4.5)
Glucose: 97 mg/dL (ref 65–99)
Potassium: 4.2 mmol/L (ref 3.5–5.2)
Sodium: 139 mmol/L (ref 134–144)
Total Protein: 7.1 g/dL (ref 6.0–8.5)
eGFR: 86 mL/min/{1.73_m2} (ref >59)

## 2021-01-25 LAB — TSH: TSH: 0.939 u[IU]/mL (ref 0.450–4.500)

## 2021-01-25 LAB — HEMOGLOBIN A1C
Est. average glucose Bld gHb Est-mCnc: 114 mg/dL
Hgb A1c MFr Bld: 5.6 % (ref 4.8–5.6)

## 2021-02-01 ENCOUNTER — Encounter: Payer: Self-pay | Admitting: Internal Medicine

## 2021-02-08 ENCOUNTER — Ambulatory Visit: Payer: 59

## 2021-02-15 ENCOUNTER — Other Ambulatory Visit: Payer: Self-pay

## 2021-02-15 MED ORDER — ACYCLOVIR 400 MG PO TABS: ORAL_TABLET | ORAL | 2 refills | Status: DC

## 2021-02-15 MED ORDER — AMLODIPINE BESYLATE 2.5 MG PO TABS: 2.5000 mg | ORAL_TABLET | Freq: Every day | ORAL | 1 refills | Status: DC

## 2021-02-21 ENCOUNTER — Other Ambulatory Visit: Payer: Self-pay

## 2021-02-21 MED ORDER — ACYCLOVIR 400 MG PO TABS: ORAL_TABLET | ORAL | 2 refills | Status: DC

## 2021-03-07 ENCOUNTER — Ambulatory Visit: Payer: 59 | Admitting: Internal Medicine

## 2021-03-14 ENCOUNTER — Encounter: Payer: Self-pay | Admitting: Neurology

## 2021-03-14 ENCOUNTER — Ambulatory Visit: Payer: 59 | Admitting: Internal Medicine

## 2021-03-14 ENCOUNTER — Institutional Professional Consult (permissible substitution): Payer: 59 | Admitting: Neurology

## 2021-03-14 ENCOUNTER — Ambulatory Visit (INDEPENDENT_AMBULATORY_CARE_PROVIDER_SITE_OTHER): Payer: 59 | Admitting: Neurology

## 2021-03-14 VITALS — BP 144/97 | HR 89 | Ht 68.5 in | Wt 219.0 lb

## 2021-03-14 DIAGNOSIS — R457 State of emotional shock and stress, unspecified: Secondary | ICD-10-CM | POA: Insufficient documentation

## 2021-03-14 DIAGNOSIS — R0601 Orthopnea: Secondary | ICD-10-CM | POA: Insufficient documentation

## 2021-03-14 DIAGNOSIS — R419 Unspecified symptoms and signs involving cognitive functions and awareness: Secondary | ICD-10-CM

## 2021-03-14 DIAGNOSIS — G4730 Sleep apnea, unspecified: Secondary | ICD-10-CM

## 2021-03-14 DIAGNOSIS — F5112 Insufficient sleep syndrome: Secondary | ICD-10-CM

## 2021-03-14 DIAGNOSIS — R0683 Snoring: Secondary | ICD-10-CM | POA: Insufficient documentation

## 2021-03-14 DIAGNOSIS — R351 Nocturia: Secondary | ICD-10-CM

## 2021-03-14 DIAGNOSIS — G4709 Other insomnia: Secondary | ICD-10-CM | POA: Insufficient documentation

## 2021-03-14 MED ORDER — TRAZODONE HCL 50 MG PO TABS: 50.0000 mg | ORAL_TABLET | Freq: Every evening | ORAL | 1 refills | Status: DC | PRN

## 2021-03-14 NOTE — Patient Instructions (Signed)
Insomnia Insomnia is a sleep disorder that makes it difficult to fall asleep or stay asleep. Insomnia can cause fatigue, low energy, difficulty concentrating, mood swings, and poor performance at work or school. There are three different ways to classify insomnia:  Difficulty falling asleep.  Difficulty staying asleep.  Waking up too early in the morning. Any type of insomnia can be long-term (chronic) or short-term (acute). Both are common. Short-term insomnia usually lasts for three months or less. Chronic insomnia occurs at least three times a week for longer than three months. What are the causes? Insomnia may be caused by another condition, situation, or substance, such as:  Anxiety.  Certain medicines.  Gastroesophageal reflux disease (GERD) or other gastrointestinal conditions.  Asthma or other breathing conditions.  Restless legs syndrome, sleep apnea, or other sleep disorders.  Chronic pain.  Menopause.  Stroke.  Abuse of alcohol, tobacco, or illegal drugs.  Mental health conditions, such as depression.  Caffeine.  Neurological disorders, such as Alzheimer's disease.  An overactive thyroid (hyperthyroidism). Sometimes, the cause of insomnia may not be known. What increases the risk? Risk factors for insomnia include:  Gender. Women are affected more often than men.  Age. Insomnia is more common as you get older.  Stress.  Lack of exercise.  Irregular work schedule or working night shifts.  Traveling between different time zones.  Certain medical and mental health conditions. What are the signs or symptoms? If you have insomnia, the main symptom is having trouble falling asleep or having trouble staying asleep. This may lead to other symptoms, such as:  Feeling fatigued or having low energy.  Feeling nervous about going to sleep.  Not feeling rested in the morning.  Having trouble concentrating.  Feeling irritable, anxious, or depressed. How  is this diagnosed? This condition may be diagnosed based on:  Your symptoms and medical history. Your health care provider may ask about: ? Your sleep habits. ? Any medical conditions you have. ? Your mental health.  A physical exam. How is this treated? Treatment for insomnia depends on the cause. Treatment may focus on treating an underlying condition that is causing insomnia. Treatment may also include:  Medicines to help you sleep.  Counseling or therapy.  Lifestyle adjustments to help you sleep better. Follow these instructions at home: Eating and drinking  Limit or avoid alcohol, caffeinated beverages, and cigarettes, especially close to bedtime. These can disrupt your sleep.  Do not eat a large meal or eat spicy foods right before bedtime. This can lead to digestive discomfort that can make it hard for you to sleep.   Sleep habits  Keep a sleep diary to help you and your health care provider figure out what could be causing your insomnia. Write down: ? When you sleep. ? When you wake up during the night. ? How well you sleep. ? How rested you feel the next day. ? Any side effects of medicines you are taking. ? What you eat and drink.  Make your bedroom a dark, comfortable place where it is easy to fall asleep. ? Put up shades or blackout curtains to block light from outside. ? Use a white noise machine to block noise. ? Keep the temperature cool.  Limit screen use before bedtime. This includes: ? Watching TV. ? Using your smartphone, tablet, or computer.  Stick to a routine that includes going to bed and waking up at the same times every day and night. This can help you fall asleep faster. Consider   making a quiet activity, such as reading, part of your nighttime routine.  Try to avoid taking naps during the day so that you sleep better at night.  Get out of bed if you are still awake after 15 minutes of trying to sleep. Keep the lights down, but try reading or  doing a quiet activity. When you feel sleepy, go back to bed.   General instructions  Take over-the-counter and prescription medicines only as told by your health care provider.  Exercise regularly, as told by your health care provider. Avoid exercise starting several hours before bedtime.  Use relaxation techniques to manage stress. Ask your health care provider to suggest some techniques that may work well for you. These may include: ? Breathing exercises. ? Routines to release muscle tension. ? Visualizing peaceful scenes.  Make sure that you drive carefully. Avoid driving if you feel very sleepy.  Keep all follow-up visits as told by your health care provider. This is important. Contact a health care provider if:  You are tired throughout the day.  You have trouble in your daily routine due to sleepiness.  You continue to have sleep problems, or your sleep problems get worse. Get help right away if:  You have serious thoughts about hurting yourself or someone else. If you ever feel like you may hurt yourself or others, or have thoughts about taking your own life, get help right away. You can go to your nearest emergency department or call:  Your local emergency services (911 in the [place]  A suicide crisis helpline, such as the [place] at 9135813744. This is open 24 hours a day. Summary  Insomnia is a sleep disorder that makes it difficult to fall asleep or stay asleep.  Insomnia can be long-term (chronic) or short-term (acute).  Treatment for insomnia depends on the cause. Treatment may focus on treating an underlying condition that is causing insomnia.  Keep a sleep diary to help you and your health care provider figure out what could be causing your insomnia. This information is not intended to replace advice given to you by your health care provider. Make sure you discuss any questions you have with your health care provider. Document  Revised: 08/19/2020 Document Reviewed: 08/19/2020 Elsevier Patient Education  2021 [place] for Sleep Apnea  Sleep apnea is a condition in which breathing pauses or becomes shallow during sleep. Sleep apnea screening is a test to determine if you are at risk for sleep apnea. The test is easy and only takes a few minutes. Your health care provider may ask you to have this test in preparation for surgery or as part of a physical exam. What are the symptoms of sleep apnea? Common symptoms of sleep apnea include:  Snoring.  Restless sleep.  Daytime sleepiness.  Pauses in breathing.  Choking during sleep.  Irritability.  Forgetfulness.  Trouble thinking clearly.  Depression.  Personality changes. Most people with sleep apnea are not aware that they have it. Why should I get screened? Getting screened for sleep apnea can help:  Ensure your safety. It is important for your health care providers to know whether or not you have sleep apnea, especially if you are having surgery or have other long-term (chronic) health conditions.  Improve your health and allow you to get a better night's rest. Restful sleep can help you: ? Have more energy. ? Lose weight. ? Improve high blood pressure. ? Improve diabetes management. ? Prevent stroke. ? Prevent  car accidents. How is screening done? Screening usually includes being asked a list of questions about your sleep quality. Some questions you may be asked include:  Do you snore?  Is your sleep restless?  Do you have daytime sleepiness?  Has a partner or spouse told you that you stop breathing during sleep?  Have you had trouble concentrating or memory loss? If your screening test is positive, you are at risk for the condition. Further testing may be needed to confirm a diagnosis of sleep apnea. Where to find more information You can find screening tools online or at your health care clinic. For more information about  sleep apnea screening and healthy sleep, visit these websites:  Centers for Disease Control and Prevention: LearningDermatology.pl  American Sleep Apnea Association: www.sleepapnea.org Contact a health care provider if:  You think that you may have sleep apnea. Summary  Sleep apnea screening can help determine if you are at risk for sleep apnea.  It is important for your health care providers to know whether or not you have sleep apnea, especially if you are having surgery or have other chronic health conditions.  You may be asked to take a screening test for sleep apnea in preparation for surgery or as part of a physical exam. This information is not intended to replace advice given to you by your health care provider. Make sure you discuss any questions you have with your health care provider. Document Revised: 07/26/2018 Document Reviewed: 01/19/2017 Elsevier Patient Education  2021 [place] Sleep Information, Adult Quality sleep is important for your mental and physical health. It also improves your quality of life. Quality sleep means you:  Are asleep for most of the time you are in bed.  Fall asleep within 30 minutes.  Wake up no more than once a night.  Are awake for no longer than 20 minutes if you do wake up during the night. Most adults need 7-8 hours of quality sleep each night. How can poor sleep affect me? If you do not get enough quality sleep, you may have:  Mood swings.  Daytime sleepiness.  Confusion.  Decreased reaction time.  Sleep disorders, such as insomnia and sleep apnea.  Difficulty with: ? Solving problems. ? Coping with stress. ? Paying attention. These issues may affect your performance and productivity at work, school, and at home. Lack of sleep may also put you at higher risk for accidents, suicide, and risky behaviors. If you do not get quality sleep you may also be at higher risk for several health problems,  including:  Infections.  Type 2 diabetes.  Heart disease.  High blood pressure.  Obesity.  Worsening of long-term conditions, like arthritis, kidney disease, depression, Parkinson's disease, and epilepsy. What actions can I take to get more quality sleep?  Stick to a sleep schedule. Go to sleep and wake up at about the same time each day. Do not try to sleep less on weekdays and make up for lost sleep on weekends. This does not work.  Try to get about 30 minutes of exercise on most days. Do not exercise 2-3 hours before going to bed.  Limit naps during the day to 30 minutes or less.  Do not use any products that contain nicotine or tobacco, such as cigarettes or e-cigarettes. If you need help quitting, ask your health care provider.  Do not drink caffeinated beverages for at least 8 hours before going to bed. Coffee, tea, and some sodas contain caffeine.  Do not drink alcohol close to bedtime.  Do not eat large meals close to bedtime.  Do not take naps in the late afternoon.  Try to get at least 30 minutes of sunlight every day. Morning sunlight is best.  Make time to relax before bed. Reading, listening to music, or taking a hot bath promotes quality sleep.  Make your bedroom a place that promotes quality sleep. Keep your bedroom dark, quiet, and at a comfortable room temperature. Make sure your bed is comfortable. Take out sleep distractions like TV, a computer, smartphone, and bright lights.  If you are lying awake in bed for longer than 20 minutes, get up and do a relaxing activity until you feel sleepy.  Work with your health care provider to treat medical conditions that may affect sleeping, such as: ? Nasal obstruction. ? Snoring. ? Sleep apnea and other sleep disorders.  Talk to your health care provider if you think any of your prescription medicines may cause you to have difficulty falling or staying asleep.  If you have sleep problems, talk with a sleep  consultant. If you think you have a sleep disorder, talk with your health care provider about getting evaluated by a specialist.      Where to find more information  [place] website: https://sleepfoundation.org  National Heart, Lung, and [place] ([place]): http://www.saunders.info/.pdf  Centers for Disease Control and Prevention (CDC): LearningDermatology.pl Contact a health care provider if you:  Have trouble getting to sleep or staying asleep.  Often wake up very early in the morning and cannot get back to sleep.  Have daytime sleepiness.  Have daytime sleep attacks of suddenly falling asleep and sudden muscle weakness (narcolepsy).  Have a tingling sensation in your legs with a strong urge to move your legs (restless legs syndrome).  Stop breathing briefly during sleep (sleep apnea).  Think you have a sleep disorder or are taking a medicine that is affecting your quality of sleep. Summary  Most adults need 7-8 hours of quality sleep each night.  Getting enough quality sleep is an important part of health and well-being.  Make your bedroom a place that promotes quality sleep and avoid things that may cause you to have poor sleep, such as alcohol, caffeine, smoking, and large meals.  Talk to your health care provider if you have trouble falling asleep or staying asleep. This information is not intended to replace advice given to you by your health care provider. Make sure you discuss any questions you have with your health care provider. Document Revised: 01/16/2018 Document Reviewed: 01/16/2018 Elsevier Patient Education  2021 [employer].

## 2021-03-14 NOTE — Addendum Note (Signed)
Addended by: Larey Seat on: 03/14/2021 12:05 PM   Modules accepted: Orders

## 2021-03-14 NOTE — Progress Notes (Signed)
SLEEP MEDICINE CLINIC    Provider:  Larey Seat, MD  Primary Care Physician:  Glendale Chard, [address] [place] [address]     Referring Provider: Glendale Chard, [address] [place],  [place] [address]          Chief Complaint according to patient   Patient presents with:    . New Patient (Initial Visit)     She has been told she stops breathing in her sleep and her snoring has worsened. She avg 6 hrs a night of solid sleep and wakes up still feeling tired.                HISTORY OF PRESENT ILLNESS:  Deanna Sparks is a 45 - year old  African-American female patient and was seen hereupon referral on 03/14/2021 from her PCP. She reports having frequent headaches, often with some residual nausea, sometimes present when waking from sleep. She feels poorly rested and not refreshed in AM. Her BP was first diagnosed 8 years ago, and  has been elevated again  for several month.   Chief concern according to patient : "not getting good sleep , sleep is fragmanted, lot's of bathroom breaks,  I work  From 7 AM- 3 PM , rising at 5 AM. My sleep and snoring changed over the last 8 years - since pregnancy".    I have the pleasure of seeing Deanna Sparks today, a right-handed [place] American female with a possible sleep disorder.  She has a medical history of Obesity and Hypertension, Goiter and Migraines. Her head hurst and feels heavy, her eyes feel sore.   Sleep relevant medical history: Nocturia 3-5 times , no Tonsillectomy.   Family medical /sleep history: No other family member on CPAP with OSA, insomnia, sleep walkers.    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 Patient is working as a Technical brewer- and lives in a household with her son, age 75 . Family status is single. no pets.  Tobacco use: never    ETOH use; rare,  Caffeine intake in form of Soda( 3a week ) . Regular exercise in  form of walking.      Sleep habits are as follows: The patient's dinner time is between 6-7 PM. The patient goes to bed at 12 PM and continues to sleep for 1-2 hours, wakes for many bathroom breaks. She reports difficulties to fall asleep and stay asleep. Nocturia. TV helps her to doze off.   The preferred sleep position is laterally-, with the support of 6 pillows.  Dreams are reportedly frequent . 5 AM is the usual rise time. The patient wakes up spontaneously. She reports not feeling refreshed or restored in AM, with symptoms such as dry mouth , morning headaches , and residual fatigue.  Naps are taken infrequently, there is not time but there a desire- lasting from 20 minutes and are more refreshing than nocturnal sleep.    Review of Systems: Out of a complete 14 system review, the patient complains of only the following symptoms, and all other reviewed systems are negative.:  Fatigue, sleepiness , snoring, fragmented sleep, Insomnia .     How likely are you to doze in the following situations: 0 = not likely, 1 = slight chance, 2 = moderate chance, 3 = high chance   Sitting and Reading? Watching Television? Sitting inactive in a  public place (theater or meeting)? As a passenger in a car for an hour without a break? Lying down in the afternoon when circumstances permit? Sitting and talking to someone? Sitting quietly after lunch without alcohol? In a car, while stopped for a few minutes in traffic?   Total = all over the place - 5 - 12 points- / 24 points   FSS endorsed at 38/ 63 points.   Social History   Socioeconomic History  . Marital status: Single    Spouse name: Not on file  . Number of children: 3  . Years of education: Not on file  . Highest education level: Not on file  Occupational History  . Occupation: CMA  Tobacco Use  . Smoking status: Never Smoker  . Smokeless tobacco: Never Used  Vaping Use  . Vaping Use: Never used  Substance and Sexual Activity  .  Alcohol use: Yes    Comment: occasionally  . Drug use: Never  . Sexual activity: Not on file  Other Topics Concern  . Not on file  Social History Narrative  . Not on file   Social Determinants of Health   Financial Resource Strain: Not on file  Food Insecurity: Not on file  Transportation Needs: Not on file  Physical Activity: Not on file  Stress: Not on file  Social Connections: Not on file    Family History  Problem Relation Age of Onset  . Hypertension Mother   . Healthy Father   . Thyroid disease Brother   . Anesthesia problems Neg Hx   . Other Neg Hx     Past Medical History:  Diagnosis Date  . Genital herpes    last outbreak 12/2011  . Hypertension     Past Surgical History:  Procedure Laterality Date  . CESAREAN SECTION    . LIPOMA EXCISION  2008   removed from forehead     Current Outpatient Medications on File Prior to Visit  Medication Sig Dispense Refill  . acyclovir (ZOVIRAX) 400 MG tablet TAKE 1 TABLET(400 MG) BY MOUTH TWICE DAILY 360 tablet 2  . amLODipine (NORVASC) 2.5 MG tablet Take 1 tablet (2.5 mg total) by mouth daily. 30 tablet 1  . ipratropium (ATROVENT) 0.03 % nasal spray Place 2 sprays into the nose 3 (three) times daily. (Patient taking differently: Place 2 sprays into the nose 3 (three) times daily as needed.) 30 mL 2  . lisinopril-hydrochlorothiazide (ZESTORETIC) 20-12.5 MG tablet Take 1 tablet by mouth daily. 30 tablet 4   No current facility-administered medications on file prior to visit.    Allergies  Allergen Reactions  . Codeine Itching  . Ultram [Tramadol] Itching    Physical exam:  Today's Vitals   03/14/21 1113  BP: (!) 144/97  Pulse: 89  Weight: 219 lb (99.3 kg)  Height: 5' 8.5" (1.74 m)   Body mass index is 32.81 kg/m.   Wt Readings from Last 3 Encounters:  03/14/21 219 lb (99.3 kg)  01/24/21 216 lb (98 kg)  09/24/20 212 lb 3.2 oz (96.3 kg)     Ht Readings from Last 3 Encounters:  03/14/21 5' 8.5" (1.74  m)  01/24/21 5' 8"  (1.727 m)  09/24/20 5' 8"  (1.727 m)      General: The patient is awake, alert and appears not in acute distress.  The patient is well groomed. Head: Normocephalic, atraumatic. Neck is supple. Mallampati 3 plus,  neck circumference: 17 inches . Nasal airflow  patent.   Retrognathia is not  seen.  Dental status: intact Cardiovascular:  Regular rate and cardiac rhythm by pulse,  without distended neck veins. Respiratory: Lungs are clear to auscultation.  Skin:  Without evidence of ankle edema, or rash. Trunk: The patient's posture is erect.   Neurologic exam : The patient is awake and alert, oriented to place and time.   Memory subjective described as intact. She feels she cannot concentrate as well because of fatigue,  Attention span & concentration ability appears normal.  Speech is fluent,  without  dysarthria, dysphonia or aphasia.  Mood and affect are appropriate.   Cranial nerves: no loss of smell or taste reported  Pupils are inequal ( the left is 3 mm , the right 4-5 mm wide- but both are briskly reactive to light.  Funduscopic exam deferred.  Extraocular movements in vertical and horizontal planes were intact and without nystagmus. No Diplopia. Visual fields by finger perimetry are intact. Hearing was intact to soft voice and finger rubbing.    Facial sensation intact to fine touch.  Facial motor strength is symmetric and tongue and uvula move midline.  Neck ROM :  rotation, tilt and flexion extension were normal for age and shoulder shrug was symmetrical.    Motor exam:  Symmetric bulk, tone and ROM.   Normal tone without cog- wheeling, symmetric grip strength .   Sensory:  Fine touch and vibration were normal.  Proprioception tested in the upper extremities was normal.   Coordination: Rapid alternating movements in the fingers/hands were of normal speed.  The Finger-to-nose maneuver was intact without evidence of ataxia, dysmetria or tremor.   Gait  and station: Patient could rise unassisted from a seated position, walked without assistive device.  Stance is of normal width/ base and the patient turned with 3 steps.  Toe and heel walk were deferred.  Deep tendon reflexes: in the  upper and lower extremities are symmetric and intact.  Babinski response was deferred .       After spending a total time of 35  minutes face to face and additional time for physical and neurologic examination, review of laboratory studies,  personal review of imaging studies, reports and results of other testing and review of referral information / records as far as provided in visit, I have established the following assessments:  1)  EDS, excessive fatigue and sleepiness, keeps busy.  2)  interrupted sleep at night - by Nocturia and by her autistic 38 year old son. 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  3) high risk of OSA by morning headaches, EDS,and snoring, witnessed apneas.   And also co-morbidities: HTN prediabetes,     My Plan is to proceed with:  1) HST to screen for apnea and help her treat it by CPAP , if indicated.  2) PSG would be easier due to caregiver duties - but would only work with a sleep aid.  3) I will write trazodone.   I would like to thank  Glendale Chard, [address],  [place] [id number] for allowing me to meet with and to take care of this pleasant patient.   In short, Deanna Sparks is presenting with insomnia, Nocturia, caregiver duties at night and witnessed snoring and apnea. We wil invite her for an attended PSG or HST   I plan to follow up either personally or through our NP within 3-4 month.   CC: I will share my notes  with PCP .  Electronically signed by: Larey Seat, MD 03/14/2021 11:33 AM  [facility] Board certified by The [employer] of Sleep Medicine and Diplomate of the [employer] of  Sleep Medicine. Board certified In Neurology through the [place], Fellow of the [employer] of Neurology. Medical Director of [facility].

## 2021-04-20 ENCOUNTER — Ambulatory Visit (INDEPENDENT_AMBULATORY_CARE_PROVIDER_SITE_OTHER): Payer: 59 | Admitting: Neurology

## 2021-04-20 ENCOUNTER — Ambulatory Visit: Payer: 59 | Admitting: Internal Medicine

## 2021-04-20 DIAGNOSIS — G4730 Sleep apnea, unspecified: Secondary | ICD-10-CM

## 2021-04-20 DIAGNOSIS — R0601 Orthopnea: Secondary | ICD-10-CM

## 2021-04-20 DIAGNOSIS — G4733 Obstructive sleep apnea (adult) (pediatric): Secondary | ICD-10-CM | POA: Diagnosis not present

## 2021-04-20 DIAGNOSIS — R457 State of emotional shock and stress, unspecified: Secondary | ICD-10-CM

## 2021-04-20 DIAGNOSIS — F5112 Insufficient sleep syndrome: Secondary | ICD-10-CM

## 2021-04-20 DIAGNOSIS — R0683 Snoring: Secondary | ICD-10-CM

## 2021-04-20 DIAGNOSIS — R351 Nocturia: Secondary | ICD-10-CM

## 2021-04-20 DIAGNOSIS — G4709 Other insomnia: Secondary | ICD-10-CM

## 2021-04-20 DIAGNOSIS — R419 Unspecified symptoms and signs involving cognitive functions and awareness: Secondary | ICD-10-CM

## 2021-04-21 NOTE — Progress Notes (Signed)
   [facility] TEST (Watch PAT) REPORT  STUDY DATE: 04-20-21  DOB: February 16, 1976  MRN: [id number]  ORDERING CLINICIAN: Larey Seat, MD   REFERRING CLINICIAN: Glendale Chard, MD   CLINICAL INFORMATION/HISTORY: Deanna Sparks is a 45 - year old  African-American female patient and was seen on 03/14/2021 . She reports having frequent headaches, often with some residual nausea, sometimes present when waking from sleep. She feels poorly rested and not refreshed in AM. Orthopnea and nasal drip, congestion. Her elevated BP was first diagnosed 8 years ago, and  has been elevated again for several month.  PAZ FUENTES has a medical history of Obesity and Hypertension, Goiter and Migraines.  Her head hurts and feels heavy, her eyes feel sore.   Epworth sleepiness score: 12/24. FSS 38 points.   BMI: 32.3 kg/m  Neck Circumference: 17 "  Sleep Summary:   Total Recording Time (hours, min): 7 h 11 min Total Sleep Time (hours, min):  6 h 19 min  Percent REM (%):    26.74%   Respiratory Indices:   Calculated pAHI (per hour):  5.8/hour         REM pAHI:       12.5/hour       NREM pAHI:   4.0/hour  Supine AHI:    3.3/hour  Oxygen Saturation Statistics:    Oxygen Saturation (%) Mean: 96%   Minimum oxygen saturation (%):       84%   O2 Saturation Range (%):   84 - 99%    O2 Saturation (minutes) <89 %: 0.7 min  Pulse Rate Statistics:   Pulse Mean (bpm):    79/min    Pulse Range 57 - 105/min.   IMPRESSION: This HST found only very, very mild OSA (obstructive sleep apnea) at an AHI of 5.8/h. There was no hypoxia and normal pulse rate variability.  Snoring was present.    RECOMMENDATION: This mild and uncomplicated apnea is not likely to contribute to the patient's symptoms.  Snoring at such mild apnea can be treated by a dental device if the patient desires to treat.  Her main problem seems to be insufficient sleep and insomnia.     INTERPRETING  PHYSICIAN:  Larey Seat, MD    [facility] and [facility] Sleep Board certified by The [employer] of Sleep Medicine and Diplomate of the [employer] of Sleep Medicine. Board certified In Neurology through the [place], Fellow of the [employer] of Neurology. Medical Director of [employer].

## 2021-05-05 NOTE — Procedures (Signed)
  [facility] TEST (Watch PAT) REPORT  STUDY DATE: 04-20-21  DOB: 10/21/1976  MRN: [id number]  ORDERING CLINICIAN: Larey Seat, MD   REFERRING CLINICIAN: Glendale Chard, MD   CLINICAL INFORMATION/HISTORY: Deanna Sparks is a 45 - year old  African-American female patient and was seen on 03/14/2021 . She reports having frequent headaches, often with some residual nausea, sometimes present when waking from sleep. She feels poorly rested and not refreshed in AM. Orthopnea and nasal drip, congestion. Her elevated BP was first diagnosed 8 years ago, and  has been elevated again for several month.  Deanna Sparks has a medical history of Obesity and Hypertension, Goiter and Migraines.  Her head hurts and feels heavy, her eyes feel sore.   Epworth sleepiness score: 12/24. FSS 38 points.   BMI: 32.3 kg/m  Neck Circumference: 17 "  Sleep Summary:   Total Recording Time (hours, min): 7 h 11 min Total Sleep Time (hours, min):  6 h 19 min  Percent REM (%):    26.74%   Respiratory Indices:   Calculated pAHI (per hour):  5.8/hour         REM pAHI:       12.5/hour       NREM pAHI:   4.0/hour  Supine AHI:    3.3/hour  Oxygen Saturation Statistics:    Oxygen Saturation (%) Mean: 96%   Minimum oxygen saturation (%):       84%   O2 Saturation Range (%):   84 - 99%    O2 Saturation (minutes) <89 %: 0.7 min  Pulse Rate Statistics:   Pulse Mean (bpm):    79/min    Pulse Range 57 - 105/min.   IMPRESSION: This HST found only very, very mild OSA (obstructive sleep apnea) at an AHI of 5.8/h. There was no hypoxia and normal pulse rate variability.  Snoring was present.    RECOMMENDATION: This mild and uncomplicated apnea is not likely to contribute to the patient's symptoms.  Snoring at such mild apnea can be treated by a dental device if the patient desires to treat.  Her main problem seems to be insufficient sleep and insomnia.     INTERPRETING  PHYSICIAN:  Larey Seat, MD    [facility] and [facility] Sleep Board certified by The [employer] of Sleep Medicine and Diplomate of the [employer] of Sleep Medicine. Board certified In Neurology through the [place], Fellow of the [employer] of Neurology. Medical Director of [employer].

## 2021-05-05 NOTE — Progress Notes (Signed)
  IMPRESSION: This HST found only very, very mild OSA (obstructive sleep apnea) at an AHI of 5.8/h. There was no hypoxia and normal pulse rate variability. Snoring was present.   RECOMMENDATION: This mild and uncomplicated apnea is not likely to contribute to the patient's symptoms. Snoring at such mild apnea can be treated by a dental device if the patient desires to treat. Her main problem seems to be insufficient sleep and insomnia.  If the patient wants to speak to a sleep dentist, we can arrange for a referral

## 2021-05-10 ENCOUNTER — Telehealth: Payer: Self-pay | Admitting: Neurology

## 2021-05-10 DIAGNOSIS — R0601 Orthopnea: Secondary | ICD-10-CM

## 2021-05-10 DIAGNOSIS — F5112 Insufficient sleep syndrome: Secondary | ICD-10-CM

## 2021-05-10 DIAGNOSIS — G4730 Sleep apnea, unspecified: Secondary | ICD-10-CM

## 2021-05-10 DIAGNOSIS — R419 Unspecified symptoms and signs involving cognitive functions and awareness: Secondary | ICD-10-CM

## 2021-05-10 NOTE — Telephone Encounter (Signed)
Called patient to discuss sleep study results. No answer at this time. LVM for the patient to call back.

## 2021-05-10 NOTE — Telephone Encounter (Signed)
Pt returned call. I advised pt that Dr. Brett Fairy reviewed their sleep study results and found that pt has mild sleep apnea. Dr. Brett Fairy recommends that pt starts auto CPAP. I reviewed PAP compliance expectations with the pt. Pt is agreeable to starting a CPAP. I advised pt that an order will be sent to a DME, [facility] and [facility] will call the pt within about one week after they file with the pt's insurance. [facility]) will show the pt how to use the machine, fit for masks, and troubleshoot the CPAP if needed. A follow up appt was made for insurance purposes with Dr. Brett Fairy on 08/24/2021 at 8:30 am. Pt verbalized understanding to arrive 15 minutes early and bring their CPAP. A letter with all of this information in it will be mailed to the pt as a reminder. I verified with the pt that the address we have on file is correct. Pt verbalized understanding of results. Pt had no questions at this time but was encouraged to call back if questions arise. I have sent the order to [facility])  and have received confirmation that they have received the order.

## 2021-05-10 NOTE — Addendum Note (Signed)
Addended by: Darleen Crocker on: 05/10/2021 10:49 AM   Modules accepted: Orders

## 2021-05-10 NOTE — Telephone Encounter (Signed)
-----   Message from Larey Seat, MD sent at 05/05/2021  5:03 PM EDT -----  IMPRESSION: This HST found only very, very mild OSA (obstructive sleep apnea) at an AHI of 5.8/h. There was no hypoxia and normal pulse rate variability. Snoring was present.   RECOMMENDATION: This mild and uncomplicated apnea is not likely to contribute to the patient's symptoms. Snoring at such mild apnea can be treated by a dental device if the patient desires to treat. Her main problem seems to be insufficient sleep and insomnia.  If the patient wants to speak to a sleep dentist, we can arrange for a referral

## 2021-06-08 DIAGNOSIS — G4733 Obstructive sleep apnea (adult) (pediatric): Secondary | ICD-10-CM | POA: Diagnosis not present

## 2021-06-22 ENCOUNTER — Other Ambulatory Visit: Payer: Self-pay

## 2021-06-22 ENCOUNTER — Encounter: Payer: Self-pay | Admitting: Internal Medicine

## 2021-06-22 ENCOUNTER — Ambulatory Visit (INDEPENDENT_AMBULATORY_CARE_PROVIDER_SITE_OTHER): Payer: 59 | Admitting: Internal Medicine

## 2021-06-22 VITALS — BP 116/80 | HR 95 | Temp 98.2°F | Ht 68.5 in | Wt 223.4 lb

## 2021-06-22 DIAGNOSIS — E6609 Other obesity due to excess calories: Secondary | ICD-10-CM | POA: Diagnosis not present

## 2021-06-22 DIAGNOSIS — F419 Anxiety disorder, unspecified: Secondary | ICD-10-CM | POA: Diagnosis not present

## 2021-06-22 DIAGNOSIS — G4733 Obstructive sleep apnea (adult) (pediatric): Secondary | ICD-10-CM | POA: Diagnosis not present

## 2021-06-22 DIAGNOSIS — I10 Essential (primary) hypertension: Secondary | ICD-10-CM

## 2021-06-22 DIAGNOSIS — Z6833 Body mass index (BMI) 33.0-33.9, adult: Secondary | ICD-10-CM

## 2021-06-22 DIAGNOSIS — F32A Depression, unspecified: Secondary | ICD-10-CM | POA: Insufficient documentation

## 2021-06-22 DIAGNOSIS — Z9989 Dependence on other enabling machines and devices: Secondary | ICD-10-CM

## 2021-06-22 MED ORDER — LISINOPRIL-HYDROCHLOROTHIAZIDE 20-12.5 MG PO TABS: 1.0000 | ORAL_TABLET | Freq: Every day | ORAL | 2 refills | Status: DC

## 2021-06-22 MED ORDER — ESCITALOPRAM OXALATE 10 MG PO TABS: 10.0000 mg | ORAL_TABLET | Freq: Every day | ORAL | 2 refills | Status: DC

## 2021-06-22 MED ORDER — AMLODIPINE BESYLATE 2.5 MG PO TABS: 2.5000 mg | ORAL_TABLET | Freq: Every day | ORAL | 1 refills | Status: DC

## 2021-06-22 NOTE — Progress Notes (Signed)
I,Yamilka [employer] as a Education administrator for Maximino Greenland, MD.,have documented all relevant documentation on the behalf of Maximino Greenland, MD,as directed by  Maximino Greenland, MD while in the presence of Maximino Greenland, MD.  This visit occurred during the SARS-CoV-2 public health emergency.  Safety protocols were in place, including screening questions prior to the visit, additional usage of staff PPE, and extensive cleaning of exam room while observing appropriate contact time as indicated for disinfecting solutions.  Subjective:     Patient ID: Deanna Sparks , female    DOB: 20-Oct-1976 , 45 y.o.   MRN: [id number]   Chief Complaint  Patient presents with   Anxiety    HPI  Patient presents today for anxiety. She is not sure what is contributing to her sx. States she feels anxious on occasion, unable to identify what triggers her sx besides stress.  She denies change in her eating/sleep habits.   Anxiety Presents for initial visit. Onset was 6 to 12 months ago. The problem has been gradually worsening. Symptoms include chest pain, irritability, nervous/anxious behavior and palpitations. Symptoms occur constantly. The severity of symptoms is interfering with daily activities. Exacerbated by: she feels anything triggers her anxiety.   Past treatments include nothing.    Past Medical History:  Diagnosis Date   Genital herpes    last outbreak 12/2011   Hypertension      Family History  Problem Relation Age of Onset   Hypertension Mother    Healthy Father    Thyroid disease Brother    Anesthesia problems Neg Hx    Other Neg Hx      Current Outpatient Medications:    acyclovir (ZOVIRAX) 400 MG tablet, TAKE 1 TABLET(400 MG) BY MOUTH TWICE DAILY, Disp: 360 tablet, Rfl: 2   escitalopram (LEXAPRO) 10 MG tablet, Take 1 tablet (10 mg total) by mouth daily., Disp: 30 tablet, Rfl: 2   ipratropium (ATROVENT) 0.03 % nasal spray, Place 2 sprays into the nose 3 (three) times daily.  (Patient taking differently: Place 2 sprays into the nose 3 (three) times daily as needed.), Disp: 30 mL, Rfl: 2   amLODipine (NORVASC) 2.5 MG tablet, Take 1 tablet (2.5 mg total) by mouth daily., Disp: 90 tablet, Rfl: 1   lisinopril-hydrochlorothiazide (ZESTORETIC) 20-12.5 MG tablet, Take 1 tablet by mouth daily., Disp: 90 tablet, Rfl: 2   Allergies  Allergen Reactions   Codeine Itching   Ultram [Tramadol] Itching     Review of Systems  Constitutional:  Positive for irritability.  Respiratory: Negative.    Cardiovascular:  Positive for chest pain and palpitations.  Gastrointestinal: Negative.   Neurological: Negative.   Psychiatric/Behavioral:  The patient is nervous/anxious.     Today's Vitals   06/22/21 1450  BP: 116/80  Pulse: 95  Temp: 98.2 F (36.8 C)  Weight: 223 lb 6.4 oz (101.3 kg)  Height: 5' 8.5" (1.74 m)  PainSc: 0-No pain   Body mass index is 33.47 kg/m.  Wt Readings from Last 3 Encounters:  06/22/21 223 lb 6.4 oz (101.3 kg)  03/14/21 219 lb (99.3 kg)  01/24/21 216 lb (98 kg)     Objective:  Physical Exam Vitals and nursing note reviewed.  Constitutional:      Appearance: Normal appearance.  HENT:     Head: Normocephalic and atraumatic.     Nose:     Comments: Masked     Mouth/Throat:     Comments: Masked Cardiovascular:  Rate and Rhythm: Normal rate and regular rhythm.     Heart sounds: Normal heart sounds.  Pulmonary:     Effort: Pulmonary effort is normal.     Breath sounds: Normal breath sounds.  Musculoskeletal:     Cervical back: Normal range of motion.  Skin:    General: Skin is warm.  Neurological:     General: No focal deficit present.     Mental Status: She is alert.  Psychiatric:        Mood and Affect: Mood normal.        Behavior: Behavior normal.        Assessment And Plan:     1. Anxiety Comments: I will start her on escitalopram 12m daily. She agrees to rto in 4-6 weeks for re-evaluation. She may also benefit from  therapy. I will also check labs as listed below. Pt advised her sx could also be due to perimenopause.  - TSH - Magnesium  2. OSA on CPAP Comments: Chronic, importance of CPAP compliance was d/w patient. Encouraged to wear at least 4 hrs/night.   3. Essential hypertension Comments: Chronic, well controlled. NO change in meds today. Encouraged to follow low sodium diet.   4. Class 1 obesity due to excess calories with body mass index (BMI) of 33.0 to 33.9 in adult, unspecified whether serious comorbidity present She is encouraged to strive for BMI less than 30 to decrease cardiac risk. Advised to aim for at least 150 minutes of exercise per week.   Patient was given opportunity to ask questions. Patient verbalized understanding of the plan and was able to repeat key elements of the plan. All questions were answered to their satisfaction.   I, RMaximino Greenland MD, have reviewed all documentation for this visit. The documentation on 06/22/21 for the exam, diagnosis, procedures, and orders are all accurate and complete.   IF YOU HAVE BEEN REFERRED TO A SPECIALIST, IT MAY TAKE 1-2 WEEKS TO SCHEDULE/PROCESS THE REFERRAL. IF YOU HAVE NOT HEARD FROM US/SPECIALIST IN TWO WEEKS, PLEASE GIVE [place] CALL AT [phone].   THE PATIENT IS ENCOURAGED TO PRACTICE SOCIAL DISTANCING DUE TO THE COVID-19 PANDEMIC.

## 2021-06-22 NOTE — Patient Instructions (Signed)
Generalized Anxiety Disorder, Adult Generalized anxiety disorder (GAD) is a mental health condition. Unlike normal worries, anxiety related to GAD is not triggered by a specific event. These worries do not fade or get better with time. GAD interferes with relationships, work, and school. GAD symptoms can vary from mild to severe. People with severe GAD can have intense waves of anxiety with physical symptoms that are similar to panic attacks. What are the causes? The exact cause of GAD is not known, but the following are believed to have an impact: Differences in natural brain chemicals. Genes passed down from parents to children. Differences in the way threats are perceived. Development during childhood. Personality. What increases the risk? The following factors may make you more likely to develop this condition: Being female. Having a family history of anxiety disorders. Being very shy. Experiencing very stressful life events, such as the death of a loved one. Having a very stressful family environment. What are the signs or symptoms? People with GAD often worry excessively about many things in their lives, such as their health and family. Symptoms may also include: Mental and emotional symptoms: Worrying excessively about natural disasters. Fear of being late. Difficulty concentrating. Fears that others are judging your performance. Physical symptoms: Fatigue. Headaches, muscle tension, muscle twitches, trembling, or feeling shaky. Feeling like your heart is pounding or beating very fast. Feeling out of breath or like you cannot take a deep breath. Having trouble falling asleep or staying asleep, or experiencing restlessness. Sweating. Nausea, diarrhea, or irritable bowel syndrome (IBS). Behavioral symptoms: Experiencing erratic moods or irritability. Avoidance of new situations. Avoidance of people. Extreme difficulty making decisions. How is this diagnosed? This condition  is diagnosed based on your symptoms and medical history. You will also have a physical exam. Your health care provider may perform tests to rule out other possible causes of your symptoms. To be diagnosed with GAD, a person must have anxiety that: Is out of his or her control. Affects several different aspects of his or her life, such as work and relationships. Causes distress that makes him or her unable to take part in normal activities. Includes at least three symptoms of GAD, such as restlessness, fatigue, trouble concentrating, irritability, muscle tension, or sleep problems. Before your health care provider can confirm a diagnosis of GAD, these symptoms must be present more days than they are not, and they must last for 6 months or longer. How is this treated? This condition may be treated with: Medicine. Antidepressant medicine is usually prescribed for long-term daily control. Anti-anxiety medicines may be added in severe cases, especially when panic attacks occur. Talk therapy (psychotherapy). Certain types of talk therapy can be helpful in treating GAD by providing support, education, and guidance. Options include: Cognitive behavioral therapy (CBT). People learn coping skills and self-calming techniques to ease their physical symptoms. They learn to identify unrealistic thoughts and behaviors and to replace them with more appropriate thoughts and behaviors. Acceptance and commitment therapy (ACT). This treatment teaches people how to be mindful as a way to cope with unwanted thoughts and feelings. Biofeedback. This process trains you to manage your body's response (physiological response) through breathing techniques and relaxation methods. You will work with a therapist while machines are used to monitor your physical symptoms. Stress management techniques. These include yoga, meditation, and exercise. A mental health specialist can help determine which treatment is best for you. Some  people see improvement with one type of therapy. However, other people require a combination  of therapies. Follow these instructions at home: Lifestyle Maintain a consistent routine and schedule. Anticipate stressful situations. Create a plan, and allow extra time to work with your plan. Practice stress management or self-calming techniques that you have learned from your therapist or your health care provider. General instructions Take over-the-counter and prescription medicines only as told by your health care provider. Understand that you are likely to have setbacks. Accept this and be kind to yourself as you persist to take better care of yourself. Recognize and accept your accomplishments, even if you judge them as small. Keep all follow-up visits as told by your health care provider. This is important. Contact a health care provider if: Your symptoms do not get better. Your symptoms get worse. You have signs of depression, such as: A persistently sad or irritable mood. Loss of enjoyment in activities that used to bring you joy. Change in weight or eating. Changes in sleeping habits. Avoiding friends or family members. Loss of energy for normal tasks. Feelings of guilt or worthlessness. Get help right away if: You have serious thoughts about hurting yourself or others. If you ever feel like you may hurt yourself or others, or have thoughts about taking your own life, get help right away. Go to your nearest emergency department or: Call your local emergency services (911 in the [place] Call a suicide crisis helpline, such as the [place] at (360)763-3611. This is open 24 hours a day in the [place] Text the Crisis Text Line at 908-119-6678 (in the [place].). Summary Generalized anxiety disorder (GAD) is a mental health condition that involves worry that is not triggered by a specific event. People with GAD often worry excessively about many things in their lives, such  as their health and family. GAD may cause symptoms such as restlessness, trouble concentrating, sleep problems, frequent sweating, nausea, diarrhea, headaches, and trembling or muscle twitching. A mental health specialist can help determine which treatment is best for you. Some people see improvement with one type of therapy. However, other people require a combination of therapies. This information is not intended to replace advice given to you by your health care provider. Make sure you discuss any questions you have with your health care provider. Document Revised: 07/30/2019 Document Reviewed: 07/30/2019 Elsevier Patient Education  [place].

## 2021-06-23 LAB — MAGNESIUM: Magnesium: 2.1 mg/dL (ref 1.6–2.3)

## 2021-06-23 LAB — TSH: TSH: 0.795 u[IU]/mL (ref 0.450–4.500)

## 2021-08-02 ENCOUNTER — Other Ambulatory Visit: Payer: Self-pay

## 2021-08-02 ENCOUNTER — Encounter: Payer: Self-pay | Admitting: Internal Medicine

## 2021-08-02 ENCOUNTER — Ambulatory Visit (INDEPENDENT_AMBULATORY_CARE_PROVIDER_SITE_OTHER): Payer: 59 | Admitting: Internal Medicine

## 2021-08-02 VITALS — BP 130/82 | HR 92 | Temp 98.1°F | Ht 68.0 in | Wt 223.6 lb

## 2021-08-02 DIAGNOSIS — Z0000 Encounter for general adult medical examination without abnormal findings: Secondary | ICD-10-CM

## 2021-08-02 DIAGNOSIS — Z1231 Encounter for screening mammogram for malignant neoplasm of breast: Secondary | ICD-10-CM

## 2021-08-02 DIAGNOSIS — F419 Anxiety disorder, unspecified: Secondary | ICD-10-CM

## 2021-08-02 DIAGNOSIS — Z0001 Encounter for general adult medical examination with abnormal findings: Secondary | ICD-10-CM | POA: Diagnosis not present

## 2021-08-02 DIAGNOSIS — Z1211 Encounter for screening for malignant neoplasm of colon: Secondary | ICD-10-CM

## 2021-08-02 DIAGNOSIS — N760 Acute vaginitis: Secondary | ICD-10-CM | POA: Diagnosis not present

## 2021-08-02 DIAGNOSIS — I10 Essential (primary) hypertension: Secondary | ICD-10-CM | POA: Diagnosis not present

## 2021-08-02 LAB — POCT URINALYSIS DIPSTICK
Bilirubin, UA: NEGATIVE
Glucose, UA: NEGATIVE
Ketones, UA: NEGATIVE
Leukocytes, UA: NEGATIVE
Nitrite, UA: NEGATIVE
Protein, UA: NEGATIVE
Spec Grav, UA: >=1.03 — AB (ref 1.010–1.025)
Urobilinogen, UA: 1 E.U./dL
pH, UA: 6 (ref 5.0–8.0)

## 2021-08-02 LAB — POCT UA - MICROALBUMIN
Albumin/Creatinine Ratio, Urine, POC: <30
Creatinine, POC: 300 mg/dL
Microalbumin Ur, POC: 10 mg/L

## 2021-08-02 MED ORDER — FLUCONAZOLE 150 MG PO TABS: ORAL_TABLET | ORAL | 0 refills | Status: DC

## 2021-08-02 NOTE — Progress Notes (Signed)
Earleen Newport as a Education administrator for Maximino Greenland, MD.,have documented all relevant documentation on the behalf of Maximino Greenland, MD,as directed by  Maximino Greenland, MD while in the presence of Maximino Greenland, MD.  This visit occurred during the SARS-CoV-2 public health emergency.  Safety protocols were in place, including screening questions prior to the visit, additional usage of staff PPE, and extensive cleaning of exam room while observing appropriate contact time as indicated for disinfecting solutions.  Subjective:     Patient ID: Deanna Sparks , female    DOB: 02/03/1976 , 45 y.o.   MRN: [id number]   Chief Complaint  Patient presents with   Annual Exam   Hypertension     HPI  Pt here for HM. She was previously seen at [facility] for her GYN care; however, she is establishing care with [facility] in Nov 2022. She declined her flu shot. She reports compliance with meds. She denies headache, chest pain, shortness of breath, dizziness. Pt would like STD screening, she complains of itching, burning.   Hypertension This is a chronic problem. The current episode started more than 1 year ago. The problem has been gradually improving since onset. The problem is controlled. Pertinent negatives include no anxiety, blurred vision, palpitations or shortness of breath. Past treatments include lifestyle changes. The current treatment provides moderate improvement.    Past Medical History:  Diagnosis Date   Genital herpes    last outbreak 12/2011   Hypertension      Family History  Problem Relation Age of Onset   Hypertension Mother    Healthy Father    Thyroid disease Brother    Anesthesia problems Neg Hx    Other Neg Hx      Current Outpatient Medications:    acyclovir (ZOVIRAX) 400 MG tablet, TAKE 1 TABLET(400 MG) BY MOUTH TWICE DAILY, Disp: 360 tablet, Rfl: 2   amLODipine (NORVASC) 2.5 MG tablet, Take 1 tablet (2.5 mg total) by mouth daily., Disp: 90  tablet, Rfl: 1   fluconazole (DIFLUCAN) 150 MG tablet, One tab po today, repeat in 2 days, Disp: 2 tablet, Rfl: 0   lisinopril-hydrochlorothiazide (ZESTORETIC) 20-12.5 MG tablet, Take 1 tablet by mouth daily., Disp: 90 tablet, Rfl: 2   escitalopram (LEXAPRO) 10 MG tablet, Take 1 tablet (10 mg total) by mouth daily. (Patient not taking: Reported on 08/02/2021), Disp: 30 tablet, Rfl: 2   ipratropium (ATROVENT) 0.03 % nasal spray, Place 2 sprays into the nose 3 (three) times daily. (Patient taking differently: Place 2 sprays into the nose 3 (three) times daily as needed.), Disp: 30 mL, Rfl: 2   Allergies  Allergen Reactions   Codeine Itching   Ultram [Tramadol] Itching      The patient states she uses none for birth control. Last LMP was Patient's last menstrual period was 07/12/2021 (exact date)..   . Negative for: breast discharge, breast lump(s), breast pain and breast self exam. Associated symptoms include abnormal vaginal bleeding. Pertinent negatives include abnormal bleeding (hematology), anxiety, decreased libido, depression, difficulty falling sleep, dyspareunia, history of infertility, nocturia, sexual dysfunction, sleep disturbances, urinary incontinence, urinary urgency, vaginal discharge and vaginal itching. Diet regular.The patient states her exercise level is  intermittent.  . The patient's tobacco use is:  Social History   Tobacco Use  Smoking Status Never  Smokeless Tobacco Never  . She has been exposed to passive smoke. The patient's alcohol use is:  Social History   Substance and Sexual  Activity  Alcohol Use Yes   Comment: occasionally    Review of Systems  Constitutional: Negative.   HENT: Negative.    Eyes: Negative.  Negative for blurred vision.  Respiratory: Negative.  Negative for shortness of breath.   Cardiovascular: Negative.  Negative for palpitations.  Gastrointestinal: Negative.   Endocrine: Negative.   Genitourinary:  Positive for vaginal discharge.        Also with vaginal itching. Discharge is described as grey color, fishy smell. Sx started about 2 weeks ago. She is currently sexually active, last active about a week ago.   Musculoskeletal: Negative.   Skin: Negative.   Allergic/Immunologic: Negative.   Neurological: Negative.   Hematological: Negative.   Psychiatric/Behavioral:  The patient is nervous/anxious.        States she stopped escitalopram. States she thinks it made her feel worse. Admits that she did not notify office of her concerns. She does not wish to try another medication.     Today's Vitals   08/02/21 1040  BP: 130/82  Pulse: 92  Temp: 98.1 F (36.7 C)  Weight: 223 lb 9.6 oz (101.4 kg)  Height: 5' 8" (1.727 m)  PainSc: 0-No pain   Body mass index is 34 kg/m.  Wt Readings from Last 3 Encounters:  08/02/21 223 lb 9.6 oz (101.4 kg)  06/22/21 223 lb 6.4 oz (101.3 kg)  03/14/21 219 lb (99.3 kg)    Objective:  Physical Exam Exam conducted with a chaperone present.  Constitutional:      General: She is not in acute distress.    Appearance: Normal appearance. She is well-developed. She is obese.  HENT:     Head: Normocephalic and atraumatic.     Right Ear: Hearing, tympanic membrane, ear canal and external ear normal. There is no impacted cerumen.     Left Ear: Hearing, tympanic membrane, ear canal and external ear normal. There is no impacted cerumen.     Nose:     Comments: Deferred - masked    Mouth/Throat:     Comments: Deferred - masked Eyes:     General: Lids are normal.     Extraocular Movements: Extraocular movements intact.     Conjunctiva/sclera: Conjunctivae normal.     Pupils: Pupils are equal, round, and reactive to light.     Funduscopic exam:    Right eye: No papilledema.        Left eye: No papilledema.  Neck:     Thyroid: Thyromegaly present. No thyroid mass.     Vascular: No carotid bruit.  Cardiovascular:     Rate and Rhythm: Normal rate and regular rhythm.     Pulses: Normal  pulses.     Heart sounds: Normal heart sounds. No murmur heard. Pulmonary:     Effort: Pulmonary effort is normal.     Breath sounds: Normal breath sounds.  Chest:     Chest wall: No mass.  Breasts:    Tanner Score is 5.     Right: Normal. No mass or tenderness.     Left: Normal. No mass or tenderness.  Abdominal:     General: Abdomen is flat. Bowel sounds are normal. There is no distension.     Palpations: Abdomen is soft.     Tenderness: There is no abdominal tenderness.  Genitourinary:    General: Normal vulva.     Exam position: Lithotomy position.     Vagina: Vaginal discharge present.     Cervix: Normal.  Rectum: Guaiac result negative.  Musculoskeletal:        General: No swelling. Normal range of motion.     Cervical back: Full passive range of motion without pain, normal range of motion and neck supple.     Right lower leg: No edema.     Left lower leg: No edema.  Lymphadenopathy:     Upper Body:     Right upper body: No supraclavicular, axillary or pectoral adenopathy.     Left upper body: No supraclavicular, axillary or pectoral adenopathy.  Skin:    General: Skin is warm and dry.     Capillary Refill: Capillary refill takes less than 2 seconds.  Neurological:     General: No focal deficit present.     Mental Status: She is alert and oriented to person, place, and time.     Cranial Nerves: No cranial nerve deficit.     Sensory: No sensory deficit.  Psychiatric:        Mood and Affect: Mood normal.        Behavior: Behavior normal.        Thought Content: Thought content normal.        Judgment: Judgment normal.        Assessment And Plan:     1. Annual physical exam Comments: A full exam was performed. Importance of monthly self breast exams was discussed with the patient. She is planning to establish w/ [facility] in Nov 22.  PATIENT IS ADVISED TO GET 30-45 MINUTES REGULAR EXERCISE NO LESS THAN FOUR TO FIVE DAYS PER WEEK - BOTH WEIGHTBEARING  EXERCISES AND AEROBIC ARE RECOMMENDED.  PATIENT IS ADVISED TO FOLLOW A HEALTHY DIET WITH AT LEAST SIX FRUITS/VEGGIES PER DAY, DECREASE INTAKE OF RED MEAT, AND TO INCREASE FISH INTAKE TO TWO DAYS PER WEEK.  MEATS/FISH SHOULD NOT BE FRIED, BAKED OR BROILED IS PREFERABLE.  IT IS ALSO IMPORTANT TO CUT BACK ON YOUR SUGAR INTAKE. PLEASE AVOID ANYTHING WITH ADDED SUGAR, CORN SYRUP OR OTHER SWEETENERS. IF YOU MUST USE A SWEETENER, YOU CAN TRY STEVIA. IT IS ALSO IMPORTANT TO AVOID ARTIFICIALLY SWEETENERS AND DIET BEVERAGES. LASTLY, I SUGGEST WEARING SPF 50 SUNSCREEN ON EXPOSED PARTS AND ESPECIALLY WHEN IN THE DIRECT SUNLIGHT FOR AN EXTENDED PERIOD OF TIME.  PLEASE AVOID FAST FOOD RESTAURANTS AND INCREASE YOUR WATER INTAKE.  - CMP14+EGFR - CBC - Hemoglobin A1c - Lipid panel - MM DIGITAL SCREENING BILATERAL; Future  2. Essential hypertension Comments: Chronic, controlled.  EKG performed, NSR w/o acute changes. Encouraged to follow low sodium diet. She will rto in six months for re-evaluation.  - EKG 12-Lead - POCT Urinalysis Dipstick (81002) - POCT UA - Microalbumin  3. Anxiety Comments: She no longer wants to take meds. she does agree to therapy at this time.  - Ambulatory referral to Psychology  4. Acute vaginitis Comments: Exam suggestive of Candida, will send rx diflucan. Pt would like full Nuswab, orders placed as requested. - NuSwab Vaginitis Plus (VG+)  5. Colon cancer screening Comments: She does not wish to have a colonoscopy. She does agree to [employer] testing. She verbalizes understanding that pos Cologuard warrants colonoscopy.  - Cologuard  6. Encounter for screening mammogram for malignant neoplasm of breast - MM DIGITAL SCREENING BILATERAL; Future  Patient was given opportunity to ask questions. Patient verbalized understanding of the plan and was able to repeat key elements of the plan. All questions were answered to their satisfaction.   I, Maximino Greenland, MD, have  reviewed all  documentation for this visit. The documentation on 08/02/21 for the exam, diagnosis, procedures, and orders are all accurate and complete.   THE PATIENT IS ENCOURAGED TO PRACTICE SOCIAL DISTANCING DUE TO THE COVID-19 PANDEMIC.

## 2021-08-02 NOTE — Patient Instructions (Signed)
Health Maintenance, Female Adopting a healthy lifestyle and getting preventive care are important in promoting health and wellness. Ask your health care provider about: The right schedule for you to have regular tests and exams. Things you can do on your own to prevent diseases and keep yourself healthy. What should I know about diet, weight, and exercise? Eat a healthy diet  Eat a diet that includes plenty of vegetables, fruits, low-fat dairy products, and lean protein. Do not eat a lot of foods that are high in solid fats, added sugars, or sodium. Maintain a healthy weight Body mass index (BMI) is used to identify weight problems. It estimates body fat based on height and weight. Your health care provider can help determine your BMI and help you achieve or maintain a healthy weight. Get regular exercise Get regular exercise. This is one of the most important things you can do for your health. Most adults should: Exercise for at least 150 minutes each week. The exercise should increase your heart rate and make you sweat (moderate-intensity exercise). Do strengthening exercises at least twice a week. This is in addition to the moderate-intensity exercise. Spend less time sitting. Even light physical activity can be beneficial. Watch cholesterol and blood lipids Have your blood tested for lipids and cholesterol at 46 years of age, then have this test every 5 years. Have your cholesterol levels checked more often if: Your lipid or cholesterol levels are high. You are older than 46 years of age. You are at high risk for heart disease. What should I know about cancer screening? Depending on your health history and family history, you may need to have cancer screening at various ages. This may include screening for: Breast cancer. Cervical cancer. Colorectal cancer. Skin cancer. Lung cancer. What should I know about heart disease, diabetes, and high blood pressure? Blood pressure and heart  disease High blood pressure causes heart disease and increases the risk of stroke. This is more likely to develop in people who have high blood pressure readings, are of African descent, or are overweight. Have your blood pressure checked: Every 3-5 years if you are 18-39 years of age. Every year if you are 40 years old or older. Diabetes Have regular diabetes screenings. This checks your fasting blood sugar level. Have the screening done: Once every three years after age 40 if you are at a normal weight and have a low risk for diabetes. More often and at a younger age if you are overweight or have a high risk for diabetes. What should I know about preventing infection? Hepatitis B If you have a higher risk for hepatitis B, you should be screened for this virus. Talk with your health care provider to find out if you are at risk for hepatitis B infection. Hepatitis C Testing is recommended for: Everyone born from 1945 through 1965. Anyone with known risk factors for hepatitis C. Sexually transmitted infections (STIs) Get screened for STIs, including gonorrhea and chlamydia, if: You are sexually active and are younger than 46 years of age. You are older than 46 years of age and your health care provider tells you that you are at risk for this type of infection. Your sexual activity has changed since you were last screened, and you are at increased risk for chlamydia or gonorrhea. Ask your health care provider if you are at risk. Ask your health care provider about whether you are at high risk for HIV. Your health care provider may recommend a prescription medicine   to help prevent HIV infection. If you choose to take medicine to prevent HIV, you should first get tested for HIV. You should then be tested every 3 months for as long as you are taking the medicine. Pregnancy If you are about to stop having your period (premenopausal) and you may become pregnant, seek counseling before you get  pregnant. Take 400 to 800 micrograms (mcg) of folic acid every day if you become pregnant. Ask for birth control (contraception) if you want to prevent pregnancy. Osteoporosis and menopause Osteoporosis is a disease in which the bones lose minerals and strength with aging. This can result in bone fractures. If you are 65 years old or older, or if you are at risk for osteoporosis and fractures, ask your health care provider if you should: Be screened for bone loss. Take a calcium or vitamin D supplement to lower your risk of fractures. Be given hormone replacement therapy (HRT) to treat symptoms of menopause. Follow these instructions at home: Lifestyle Do not use any products that contain nicotine or tobacco, such as cigarettes, e-cigarettes, and chewing tobacco. If you need help quitting, ask your health care provider. Do not use street drugs. Do not share needles. Ask your health care provider for help if you need support or information about quitting drugs. Alcohol use Do not drink alcohol if: Your health care provider tells you not to drink. You are pregnant, may be pregnant, or are planning to become pregnant. If you drink alcohol: Limit how much you use to 0-1 drink a day. Limit intake if you are breastfeeding. Be aware of how much alcohol is in your drink. In the [place] one drink equals one 12 oz bottle of beer (355 mL), one 5 oz glass of wine (148 mL), or one 1 oz glass of hard liquor (44 mL). General instructions Schedule regular health, dental, and eye exams. Stay current with your vaccines. Tell your health care provider if: You often feel depressed. You have ever been abused or do not feel safe at home. Summary Adopting a healthy lifestyle and getting preventive care are important in promoting health and wellness. Follow your health care provider's instructions about healthy diet, exercising, and getting tested or screened for diseases. Follow your health care provider's  instructions on monitoring your cholesterol and blood pressure. This information is not intended to replace advice given to you by your health care provider. Make sure you discuss any questions you have with your health care provider. Document Revised: 12/17/2020 Document Reviewed: 10/02/2018 Elsevier Patient Education  2022 [employer]  

## 2021-08-03 LAB — CMP14+EGFR
ALT: 20 IU/L (ref 0–32)
AST: 20 IU/L (ref 0–40)
Albumin/Globulin Ratio: 1.4 (ref 1.2–2.2)
Albumin: 4.1 g/dL (ref 3.8–4.8)
Alkaline Phosphatase: 61 IU/L (ref 44–121)
BUN/Creatinine Ratio: 18 (ref 9–23)
BUN: 16 mg/dL (ref 6–24)
Bilirubin Total: 0.7 mg/dL (ref 0.0–1.2)
CO2: 25 mmol/L (ref 20–29)
Calcium: 9.5 mg/dL (ref 8.7–10.2)
Chloride: 105 mmol/L (ref 96–106)
Creatinine, Ser: 0.87 mg/dL (ref 0.57–1.00)
Globulin, Total: 3 g/dL (ref 1.5–4.5)
Glucose: 95 mg/dL (ref 70–99)
Potassium: 4.2 mmol/L (ref 3.5–5.2)
Sodium: 141 mmol/L (ref 134–144)
Total Protein: 7.1 g/dL (ref 6.0–8.5)
eGFR: 83 mL/min/{1.73_m2} (ref >59)

## 2021-08-03 LAB — CBC
Hematocrit: 40.2 % (ref 34.0–46.6)
Hemoglobin: 13.7 g/dL (ref 11.1–15.9)
MCH: 30 pg (ref 26.6–33.0)
MCHC: 34.1 g/dL (ref 31.5–35.7)
MCV: 88 fL (ref 79–97)
Platelets: 276 10*3/uL (ref 150–450)
RBC: 4.56 x10E6/uL (ref 3.77–5.28)
RDW: 12.5 % (ref 11.7–15.4)
WBC: 8.2 10*3/uL (ref 3.4–10.8)

## 2021-08-03 LAB — LIPID PANEL
Chol/HDL Ratio: 4 ratio (ref 0.0–4.4)
Cholesterol, Total: 170 mg/dL (ref 100–199)
HDL: 43 mg/dL (ref >39)
LDL Chol Calc (NIH): 107 mg/dL — ABNORMAL HIGH (ref 0–99)
Triglycerides: 109 mg/dL (ref 0–149)
VLDL Cholesterol Cal: 20 mg/dL (ref 5–40)

## 2021-08-03 LAB — HEMOGLOBIN A1C
Est. average glucose Bld gHb Est-mCnc: 117 mg/dL
Hgb A1c MFr Bld: 5.7 % — ABNORMAL HIGH (ref 4.8–5.6)

## 2021-08-05 LAB — NUSWAB VAGINITIS PLUS (VG+)
Candida albicans, NAA: NEGATIVE
Candida glabrata, NAA: NEGATIVE
Chlamydia trachomatis, NAA: NEGATIVE
Neisseria gonorrhoeae, NAA: NEGATIVE
Trich vag by NAA: NEGATIVE

## 2021-08-08 DIAGNOSIS — G4733 Obstructive sleep apnea (adult) (pediatric): Secondary | ICD-10-CM | POA: Diagnosis not present

## 2021-08-24 ENCOUNTER — Ambulatory Visit (INDEPENDENT_AMBULATORY_CARE_PROVIDER_SITE_OTHER): Payer: 59 | Admitting: Neurology

## 2021-08-24 ENCOUNTER — Encounter: Payer: Self-pay | Admitting: Neurology

## 2021-08-24 ENCOUNTER — Other Ambulatory Visit (HOSPITAL_COMMUNITY)
Admission: RE | Admit: 2021-08-24 | Discharge: 2021-08-24 | Disposition: A | Discharge status: Home / Self Care | Payer: 59 | Source: Ambulatory Visit | Attending: Student | Admitting: Student

## 2021-08-24 ENCOUNTER — Encounter: Payer: Self-pay | Admitting: Student

## 2021-08-24 ENCOUNTER — Other Ambulatory Visit: Payer: Self-pay

## 2021-08-24 ENCOUNTER — Ambulatory Visit (INDEPENDENT_AMBULATORY_CARE_PROVIDER_SITE_OTHER): Payer: 59 | Admitting: Student

## 2021-08-24 VITALS — BP 142/78 | HR 98 | Ht 68.5 in | Wt 223.0 lb

## 2021-08-24 VITALS — BP 125/78 | HR 85 | Wt 223.5 lb

## 2021-08-24 DIAGNOSIS — Z01419 Encounter for gynecological examination (general) (routine) without abnormal findings: Secondary | ICD-10-CM | POA: Insufficient documentation

## 2021-08-24 DIAGNOSIS — Z124 Encounter for screening for malignant neoplasm of cervix: Secondary | ICD-10-CM | POA: Diagnosis not present

## 2021-08-24 DIAGNOSIS — R87618 Other abnormal cytological findings on specimens from cervix uteri: Secondary | ICD-10-CM

## 2021-08-24 DIAGNOSIS — R87619 Unspecified abnormal cytological findings in specimens from cervix uteri: Secondary | ICD-10-CM | POA: Insufficient documentation

## 2021-08-24 DIAGNOSIS — R457 State of emotional shock and stress, unspecified: Secondary | ICD-10-CM | POA: Diagnosis not present

## 2021-08-24 DIAGNOSIS — R87810 Cervical high risk human papillomavirus (HPV) DNA test positive: Secondary | ICD-10-CM | POA: Insufficient documentation

## 2021-08-24 DIAGNOSIS — R87610 Atypical squamous cells of undetermined significance on cytologic smear of cervix (ASC-US): Secondary | ICD-10-CM | POA: Insufficient documentation

## 2021-08-24 DIAGNOSIS — E042 Nontoxic multinodular goiter: Secondary | ICD-10-CM

## 2021-08-24 DIAGNOSIS — Z9989 Dependence on other enabling machines and devices: Secondary | ICD-10-CM

## 2021-08-24 DIAGNOSIS — G4733 Obstructive sleep apnea (adult) (pediatric): Secondary | ICD-10-CM | POA: Diagnosis not present

## 2021-08-24 DIAGNOSIS — Z0000 Encounter for general adult medical examination without abnormal findings: Secondary | ICD-10-CM | POA: Insufficient documentation

## 2021-08-24 NOTE — Progress Notes (Signed)
SLEEP MEDICINE CLINIC    Provider:  Larey Seat, MD  Primary Care Physician:  Glendale Chard, [address] [place] [address]     Referring Provider: Glendale Chard, [address] [place],  [place] [address]          Chief Complaint according to patient   Patient presents with:     New Patient (Initial Visit)     She has been told she stops breathing in her sleep and her snoring has worsened. She avg 6 hrs a night of solid sleep and wakes up still feeling tired.        Presents today for her initial CPAP follow up. Overall she does notice benefit with using the machine. She states that sometimes she will get up to go to BR and forget to reconnect it before falling back asleep. Other times she may fall asleep without remembering to get the machine. DME: [facility]         Interval HISTORY: 08-24-2021: [name] underwent a home sleep test at [facility] sleep on 04-20-21.  She had endorsed the Epworth sleepiness score at 12 points fatigue severity at 38 points and she had concerns about loud snoring, excessive daytime sleepiness, fragmented unrefreshing sleep.  Family members have worried about her snoring and also felt bothered by it.  She felt that she was unable to focus and concentrate in daytime.  She also would not feel that she got into deep sleep and she had more often nocturia and she desired to.  Her home sleep test showed 6 hours 19 minutes of sleep was 26.7% REM sleep, an AHI mild apnea with an AHI of 5.8/h.  No hypoxia and normal pulse rate variability what was noted was bother loud snoring.  The patient tried CPAP and she has had success.  She also noted that her concentration and focusing ability has improved, her Epworth sleepiness score has now been endorsed at only 7 points down from 12, she no longer feels excessively fatigued.  Some nights she can sleep 7 hours straight without a bathroom break and she has noticed  that her bed partner has not left the bedroom as he used to.  The last 30 days of her CPAP compliance report shows 70% of days the machine has been used but 10 of those days only less than 4 hours.  So it is the the total time on CPAP that would deem her not fully compliant.  Her device is an iron breeze 28 with a pressure range between 5 and 12 cmH2O no EPR was set, the residual AHI has been 0.2/h which is a great success.  95th percentile pressure is 9.1 cmH2O and she has very few air leaks.  She sometimes still wakes up with a dry mouth and noticed that her mouth drops open so she is entertaining using a chinstrap.  I am more worried about the time on CPAP on average that needs to be over 4 hours each night.  So we will be working on this tonight.     TACCARA BUSHNELL is a 45 - year old  African-American female patient and was seen hereupon referral on 08/24/2021 from her PCP. She reports having frequent headaches, often with some residual nausea, sometimes present when waking from sleep. She feels poorly rested and not refreshed in AM. Her BP was first diagnosed 8 years ago, and  has been elevated again  for several month.  Chief concern according to patient : "not getting good sleep , sleep is fragmanted, lot's of bathroom breaks,  I work  From 7 AM- 3 PM , rising at 5 AM. My sleep and snoring changed over the last 8 years - since pregnancy".    I have the pleasure of seeing NISHKA HEIDE today, a right-handed [place] American female with a possible sleep disorder.  She has a medical history of Obesity and Hypertension, Goiter and Migraines. Her head hurst and feels heavy, her eyes feel sore.   Sleep relevant medical history: Nocturia 3-5 times , no Tonsillectomy.   Family medical /sleep history: No other family member on CPAP with OSA, insomnia, sleep walkers.    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 Patient is working as a Technical brewer-  and lives in a household with her son, age 72 . Family status is single. no pets.  Tobacco use: never    ETOH use; rare,  Caffeine intake in form of Soda( 3a week ) . Regular exercise in form of walking.      Sleep habits are as follows: The patient's dinner time is between 6-7 PM. The patient goes to bed at 12 PM and continues to sleep for 1-2 hours, wakes for many bathroom breaks. She reports difficulties to fall asleep and stay asleep. Nocturia. TV helps her to doze off.   The preferred sleep position is laterally-, with the support of 6 pillows.  Dreams are reportedly frequent . 5 AM is the usual rise time. The patient wakes up spontaneously. She reports not feeling refreshed or restored in AM, with symptoms such as dry mouth , morning headaches , and residual fatigue.  Naps are taken infrequently, there is not time but there a desire- lasting from 20 minutes and are more refreshing than nocturnal sleep.    Review of Systems: Out of a complete 14 system review, the patient complains of only the following symptoms, and all other reviewed systems are negative.:  Fatigue, sleepiness , snoring, fragmented sleep, Insomnia .     How likely are you to doze in the following situations: 0 = not likely, 1 = slight chance, 2 = moderate chance, 3 = high chance   Sitting and Reading? Watching Television? Sitting inactive in a public place (theater or meeting)? As a passenger in a car for an hour without a break? Lying down in the afternoon when circumstances permit? Sitting and talking to someone? Sitting quietly after lunch without alcohol? In a car, while stopped for a few minutes in traffic?   Total = all over the place 7 points- / 24 points   FSS endorsed at 38/ 63 points.   Dry mouth  No longer nocturia.   Social History   Socioeconomic History   Marital status: Single    Spouse name: Not on file   Number of children: 3   Years of education: Not on file   Highest education  level: Not on file  Occupational History   Occupation: CMA  Tobacco Use   Smoking status: Never   Smokeless tobacco: Never  Vaping Use   Vaping Use: Never used  Substance and Sexual Activity   Alcohol use: Yes    Comment: occasionally   Drug use: Never   Sexual activity: Not on file  Other Topics Concern   Not on file  Social History Narrative   Not on file   Social  Determinants of Health   Financial Resource Strain: Not on file  Food Insecurity: Not on file  Transportation Needs: Not on file  Physical Activity: Not on file  Stress: Not on file  Social Connections: Not on file    Family History  Problem Relation Age of Onset   Hypertension Mother    Healthy Father    Thyroid disease Brother    Anesthesia problems Neg Hx    Other Neg Hx     Past Medical History:  Diagnosis Date   Genital herpes    last outbreak 12/2011   Hypertension     Past Surgical History:  Procedure Laterality Date   CESAREAN SECTION     LIPOMA EXCISION  2008   removed from forehead     Current Outpatient Medications on File Prior to Visit  Medication Sig Dispense Refill   acyclovir (ZOVIRAX) 400 MG tablet TAKE 1 TABLET(400 MG) BY MOUTH TWICE DAILY 360 tablet 2   amLODipine (NORVASC) 2.5 MG tablet Take 1 tablet (2.5 mg total) by mouth daily. 90 tablet 1   fluconazole (DIFLUCAN) 150 MG tablet One tab po today, repeat in 2 days 2 tablet 0   lisinopril-hydrochlorothiazide (ZESTORETIC) 20-12.5 MG tablet Take 1 tablet by mouth daily. 90 tablet 2   ipratropium (ATROVENT) 0.03 % nasal spray Place 2 sprays into the nose 3 (three) times daily. (Patient taking differently: Place 2 sprays into the nose 3 (three) times daily as needed.) 30 mL 2   No current facility-administered medications on file prior to visit.    Allergies  Allergen Reactions   Codeine Itching   Ultram [Tramadol] Itching    Physical exam:  Today's Vitals   08/24/21 0821  BP: (!) 142/78  Pulse: 98  Weight: 223 lb  (101.2 kg)  Height: 5' 8.5" (1.74 m)   Body mass index is 33.41 kg/m.   Wt Readings from Last 3 Encounters:  08/24/21 223 lb (101.2 kg)  08/02/21 223 lb 9.6 oz (101.4 kg)  06/22/21 223 lb 6.4 oz (101.3 kg)     Ht Readings from Last 3 Encounters:  08/24/21 5' 8.5" (1.74 m)  08/02/21 5' 8"  (1.727 m)  06/22/21 5' 8.5" (1.74 m)      General: The patient is awake, alert and appears not in acute distress.  The patient is well groomed. Head: Normocephalic, atraumatic. Neck is supple. Mallampati 3 plus,  neck circumference: 17 inches . Nasal airflow  patent.   Retrognathia is not seen.  Dental status: intact Cardiovascular:  Regular rate and cardiac rhythm by pulse,  without distended neck veins. Respiratory: Lungs are clear to auscultation.  Skin:  Without evidence of ankle edema, or rash. Trunk: The patient's posture is erect.   Neurologic exam : The patient is awake and alert, oriented to place and time.   Memory subjective described as intact. She feels she cannot concentrate as well because of fatigue,  Attention span & concentration ability appears normal.  Speech is fluent,  without  dysarthria, dysphonia or aphasia.  Mood and affect are appropriate.   Cranial nerves: no loss of smell or taste reported  Pupils are inequal ( the left is 3 mm , the right 4-5 mm wide- but both are briskly reactive to light.  Funduscopic exam deferred.  Extraocular movements in vertical and horizontal planes were intact and without nystagmus. No Diplopia. Visual fields by finger perimetry are intact. Hearing was intact to soft voice and finger rubbing.    Facial sensation intact  to fine touch.  Facial motor strength is symmetric and tongue and uvula move midline.  Neck ROM :  rotation, tilt and flexion extension were normal for age and shoulder shrug was symmetrical.    Motor exam:  Symmetric bulk, tone and ROM.   Normal tone without cog- wheeling, symmetric grip strength .   Sensory:   Fine touch and vibration were normal.  Proprioception tested in the upper extremities was normal.   Coordination: Rapid alternating movements in the fingers/hands were of normal speed.  The Finger-to-nose maneuver was intact without evidence of ataxia, dysmetria or tremor.   Gait and station: Patient could rise unassisted from a seated position, walked without assistive device.  Stance is of normal width/ base and the patient turned with 3 steps.  Toe and heel walk were deferred.  Deep tendon reflexes: in the  upper and lower extremities are symmetric and intact.  Babinski response was deferred .       After spending a total time of 20 minutes face to face and additional time for physical and neurologic examination, review of laboratory studies,  personal review of imaging studies, reports and results of other testing and review of referral information / records as far as provided in visit, I have established the following assessments:  1) HST positive OSA, mild. On auto CPAP.   1)  EDS, excessive fatigue and sleepiness have improved on CPAP.  2)  less interrupted sleep at night - by less Nocturia and by her autistic 76 year old son. Her son used to have a lot of sleep trouble, has responded to melatonin. 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  We discussed sleep hygiene. Getting 4 hours of sleep on CPAP each night. No TV watching in bed.  3)improved eye sight, blurred vision is improving -her eyes "feel rested " she is using readers now. Has an eye exam pending.    My Plan is to proceed with:  1) RV in 2 month with NP for compliance.   I would like to thank  Glendale Chard, [place],  [place] [address] for allowing me to meet with and to take care of this pleasant patient.   In short, SHALIA BARTKO is presenting with improved  restorative sleep.  CC: I will share my notes with PCP .  Electronically signed  by: Larey Seat, MD 08/24/2021 8:39 AM  [facility] Board certified by The [employer] of Sleep Medicine and Diplomate of the [employer] of Sleep Medicine. Board certified In Neurology through the [employer], Fellow of the [employer] of Neurology. Medical Director of [facility].

## 2021-08-24 NOTE — Progress Notes (Signed)
  History:  Ms. Deanna Sparks is a 45 y.o. [phone] who presents to clinic today for pap smear. Last pap smear was in 2021. She denies any problems with her period; it "jumps around" but it is not too heavy or painful. She says it is starting to  lighten up. She has a PCP for her other health needs. She does not want any STI testing today or birth control. She reports her breasts are tender and she does not want the breast exam.   The following portions of the patient's history were reviewed and updated as appropriate: allergies, current medications, family history, past medical history, social history, past surgical history and problem list.  Review of Systems:  Review of Systems  Constitutional: Negative.   HENT: Negative.    Respiratory: Negative.    Cardiovascular: Negative.   Gastrointestinal: Negative.   Genitourinary: Negative.   Musculoskeletal: Negative.   Skin: Negative.   Neurological: Negative.   Psychiatric/Behavioral: Negative.       Objective:  Physical Exam BP 125/78   Pulse 85   Wt 223 lb 8 oz (101.4 kg)   LMP 08/08/2021   BMI 33.49 kg/m  Physical Exam Constitutional:      Appearance: Normal appearance.  Cardiovascular:     Rate and Rhythm: Normal rate.  Pulmonary:     Effort: Pulmonary effort is normal.  Abdominal:     General: Abdomen is flat.  Genitourinary:    General: Normal vulva.  Musculoskeletal:        General: Normal range of motion.  Skin:    General: Skin is warm.  Neurological:     Mental Status: She is alert.      Labs and Imaging No results found for this or any previous visit (from the past 24 hour(s)).  No results found.  Health Maintenance Due  Topic Date Due   COLONOSCOPY (Pts 45-82yr Insurance coverage will need to be confirmed)  Never done   COVID-19 Vaccine (3 - Booster for Moderna series) 07/14/2020   MAMMOGRAM  07/16/2021     Assessment & Plan:   1. Well woman exam   2. Other abnormal cytological finding of  specimen from cervix    -patient will do coLoguard-she has the box at home -Pap smear today -Mammo appt scheduled today for December 7 -no birth control or STI testing today -breast exam deferred per patient request Approximately 30 minutes of total time was spent with this patient on care and examination.   [place]11/11/2020 2:23 PM

## 2021-08-24 NOTE — Patient Instructions (Signed)
Quality Sleep Information, Adult Quality sleep is important for your mental and physical health. It also improves your quality of life. Quality sleep means you: Are asleep for most of the time you are in bed. Fall asleep within 30 minutes. Wake up no more than once a night.  Are awake for no longer than 20 minutes if you do wake up during the night. Most adults need 7-8 hours of quality sleep each night. How can poor sleep affect me? If you do not get enough quality sleep, you may have: Mood swings. Daytime sleepiness. Confusion. Decreased reaction time. Sleep disorders, such as insomnia and sleep apnea. Difficulty with: Solving problems. Coping with stress. Paying attention. These issues may affect your performance and productivity at work, school, and at home. Lack of sleep may also put you at higher risk for accidents, suicide, and risky behaviors. If you do not get quality sleep you may also be at higher risk for several health problems, including: Infections. Type 2 diabetes. Heart disease. High blood pressure. Obesity. Worsening of long-term conditions, like arthritis, kidney disease, depression, Parkinson's disease, and epilepsy. What actions can I take to get more quality sleep?   Stick to a sleep schedule. Go to sleep and wake up at about the same time each day. Do not try to sleep less on weekdays and make up for lost sleep on weekends. This does not work. Try to get about 30 minutes of exercise on most days. Do not exercise 2-3 hours before going to bed. Limit naps during the day to 30 minutes or less. Do not use any products that contain nicotine or tobacco, such as cigarettes or e-cigarettes. If you need help quitting, ask your health care provider. Do not drink caffeinated beverages for at least 8 hours before going to bed. Coffee, tea, and some sodas contain caffeine. Do not drink alcohol close to bedtime. Do not eat large meals close to bedtime. Do not take naps in  the late afternoon. Try to get at least 30 minutes of sunlight every day. Morning sunlight is best. Make time to relax before bed. Reading, listening to music, or taking a hot bath promotes quality sleep. Make your bedroom a place that promotes quality sleep. Keep your bedroom dark, quiet, and at a comfortable room temperature. Make sure your bed is comfortable. Take out sleep distractions like TV, a computer, smartphone, and bright lights. If you are lying awake in bed for longer than 20 minutes, get up and do a relaxing activity until you feel sleepy. Work with your health care provider to treat medical conditions that may affect sleeping, such as: Nasal obstruction. Snoring. Sleep apnea and other sleep disorders. Talk to your health care provider if you think any of your prescription medicines may cause you to have difficulty falling or staying asleep. If you have sleep problems, talk with a sleep consultant. If you think you have a sleep disorder, talk with your health care provider about getting evaluated by a specialist. Where to find more information [place] website: https://sleepfoundation.org National Heart, Lung, and [place] ([place]): http://www.saunders.info/.pdf Centers for Disease Control and Prevention (CDC): LearningDermatology.pl Contact a health care provider if you: Have trouble getting to sleep or staying asleep. Often wake up very early in the morning and cannot get back to sleep. Have daytime sleepiness. Have daytime sleep attacks of suddenly falling asleep and sudden muscle weakness (narcolepsy). Have a tingling sensation in your legs with a strong urge to move your legs (restless  legs syndrome). Stop breathing briefly during sleep (sleep apnea). Think you have a sleep disorder or are taking a medicine that is affecting your quality of sleep. Summary Most adults need 7-8 hours of quality sleep each  night. Getting enough quality sleep is an important part of health and well-being. Make your bedroom a place that promotes quality sleep and avoid things that may cause you to have poor sleep, such as alcohol, caffeine, smoking, and large meals. Talk to your health care provider if you have trouble falling asleep or staying asleep. This information is not intended to replace advice given to you by your health care provider. Make sure you discuss any questions you have with your health care provider. Document Revised: 01/16/2018 Document Reviewed: 01/16/2018 Elsevier Patient Education  [place].

## 2021-08-31 LAB — CYTOLOGY - PAP
Comment: NEGATIVE
Comment: NEGATIVE
Diagnosis: UNDETERMINED — AB
HPV 16: NEGATIVE
HPV 18 / 45: NEGATIVE
High risk HPV: POSITIVE — AB

## 2021-09-01 ENCOUNTER — Encounter: Payer: Self-pay | Admitting: Student

## 2021-09-01 DIAGNOSIS — R87620 Atypical squamous cells of undetermined significance on cytologic smear of vagina (ASC-US): Secondary | ICD-10-CM | POA: Insufficient documentation

## 2021-09-01 DIAGNOSIS — G5603 Carpal tunnel syndrome, bilateral upper limbs: Secondary | ICD-10-CM | POA: Diagnosis not present

## 2021-09-06 ENCOUNTER — Encounter: Payer: Self-pay | Admitting: Internal Medicine

## 2021-09-13 ENCOUNTER — Telehealth: Payer: Self-pay | Admitting: Neurology

## 2021-09-13 ENCOUNTER — Encounter: Payer: Self-pay | Admitting: Internal Medicine

## 2021-09-13 NOTE — Telephone Encounter (Signed)
Deanna Sparks with [facility] called stating they received the PA for CPAP supplies however they are needing clinical notes. She suggested attaching the notes to claim submitted online or you can fax the notes to [phone]. Case [id number]

## 2021-09-13 NOTE — Telephone Encounter (Signed)
I have copied and pasted this message and sent to the DME company since they are the ones who handle insurance authorizations.

## 2021-09-22 ENCOUNTER — Ambulatory Visit: Payer: 59 | Admitting: Student

## 2021-09-27 DIAGNOSIS — G4733 Obstructive sleep apnea (adult) (pediatric): Secondary | ICD-10-CM | POA: Diagnosis not present

## 2021-09-28 ENCOUNTER — Ambulatory Visit: Payer: 59

## 2021-10-03 ENCOUNTER — Encounter: Payer: Self-pay | Admitting: Family Medicine

## 2021-10-03 ENCOUNTER — Other Ambulatory Visit (HOSPITAL_COMMUNITY)
Admission: RE | Admit: 2021-10-03 | Discharge: 2021-10-03 | Disposition: A | Discharge status: Home / Self Care | Payer: Medicaid Other | Source: Ambulatory Visit | Attending: Student | Admitting: Student

## 2021-10-03 ENCOUNTER — Ambulatory Visit (INDEPENDENT_AMBULATORY_CARE_PROVIDER_SITE_OTHER): Payer: Medicaid Other | Admitting: Family Medicine

## 2021-10-03 ENCOUNTER — Other Ambulatory Visit: Payer: Self-pay

## 2021-10-03 VITALS — BP 139/95 | HR 73 | Ht 68.5 in | Wt 224.7 lb

## 2021-10-03 DIAGNOSIS — R87610 Atypical squamous cells of undetermined significance on cytologic smear of cervix (ASC-US): Secondary | ICD-10-CM | POA: Diagnosis not present

## 2021-10-03 DIAGNOSIS — R87810 Cervical high risk human papillomavirus (HPV) DNA test positive: Secondary | ICD-10-CM | POA: Diagnosis not present

## 2021-10-03 HISTORY — PX: COLPOSCOPY: SHX161

## 2021-10-03 NOTE — Progress Notes (Signed)
    GYNECOLOGY OFFICE COLPOSCOPY PROCEDURE NOTE  45 y.o. [id number] here for colposcopy for ASCUS with POSITIVE high risk HPV pap smear on 08/24/21. Discussed role for HPV in cervical dysplasia, need for surveillance.  Patient gave informed written consent, time out was performed.  Placed in lithotomy position. Cervix viewed with speculum and colposcope after application of acetic acid.   Colposcopy adequate? Yes  acetowhite lesion(s) noted at 9-11 o'clock; corresponding biopsies obtained.  ECC specimen obtained. All specimens were labeled and sent to pathology.  Patient was given post procedure instructions.  Will follow up pathology and manage accordingly; patient will be contacted with results and recommendations.  Routine preventative health maintenance measures emphasized.  Caren Macadam, MD, MPH, ABFM, [facility] Attending Physician Center for [facility]

## 2021-10-05 ENCOUNTER — Encounter: Payer: Self-pay | Admitting: Family Medicine

## 2021-10-05 ENCOUNTER — Telehealth: Payer: Self-pay

## 2021-10-05 LAB — SURGICAL PATHOLOGY

## 2021-10-05 NOTE — Telephone Encounter (Signed)
The patient was asked if she received the cologuard test kit. The patient said yes. She was asked if she can complete the kit before it expires.  The pt said she will complete and ship it tomorrow.

## 2021-10-06 ENCOUNTER — Encounter: Payer: Self-pay | Admitting: Family Medicine

## 2021-10-08 DIAGNOSIS — G4733 Obstructive sleep apnea (adult) (pediatric): Secondary | ICD-10-CM | POA: Diagnosis not present

## 2021-10-11 ENCOUNTER — Telehealth: Payer: Self-pay

## 2021-10-11 NOTE — Telephone Encounter (Addendum)
-----   Message from Caren Macadam, MD sent at 10/06/2021 11:43 AM EST ----- CIN 1. Needs repeat pap in 1 year.   Called pt to review results and provider recommendation. No questions or concerns at this time. Recall placed.

## 2021-10-21 ENCOUNTER — Other Ambulatory Visit: Payer: Self-pay | Admitting: Neurology

## 2021-10-27 ENCOUNTER — Other Ambulatory Visit: Payer: Self-pay

## 2021-10-27 ENCOUNTER — Ambulatory Visit
Admission: RE | Admit: 2021-10-27 | Discharge: 2021-10-27 | Disposition: A | Discharge status: Home / Self Care | Payer: Medicaid Other | Source: Ambulatory Visit | Attending: Internal Medicine | Admitting: Internal Medicine

## 2021-10-27 DIAGNOSIS — Z0000 Encounter for general adult medical examination without abnormal findings: Secondary | ICD-10-CM

## 2021-10-27 DIAGNOSIS — Z1231 Encounter for screening mammogram for malignant neoplasm of breast: Secondary | ICD-10-CM

## 2021-12-01 ENCOUNTER — Ambulatory Visit: Payer: Medicaid Other

## 2021-12-02 ENCOUNTER — Telehealth: Payer: Self-pay

## 2021-12-02 NOTE — Telephone Encounter (Signed)
Left pt vm, received fax from psychology specialist. Pt declined to make an appointment at this time. Provider wants to know why an appointment not scheduled, upon pt request for referral. Pt given ext [phone]. If pt decides she does need specialist in the future, she will have to call their office to reschedule.

## 2021-12-13 ENCOUNTER — Other Ambulatory Visit: Payer: Self-pay

## 2021-12-13 MED ORDER — LISINOPRIL-HYDROCHLOROTHIAZIDE 20-12.5 MG PO TABS: 1.0000 | ORAL_TABLET | Freq: Every day | ORAL | 2 refills | Status: DC

## 2021-12-20 ENCOUNTER — Ambulatory Visit: Payer: 59 | Admitting: Internal Medicine

## 2022-02-02 ENCOUNTER — Ambulatory Visit: Payer: 59 | Admitting: Internal Medicine

## 2022-02-06 DIAGNOSIS — G4733 Obstructive sleep apnea (adult) (pediatric): Secondary | ICD-10-CM | POA: Diagnosis not present

## 2022-02-15 ENCOUNTER — Ambulatory Visit (HOSPITAL_COMMUNITY)
Admission: EM | Admit: 2022-02-15 | Discharge: 2022-02-15 | Disposition: A | Discharge status: Home / Self Care | Payer: Medicaid Other | Attending: Physician Assistant | Admitting: Physician Assistant

## 2022-02-15 ENCOUNTER — Other Ambulatory Visit: Payer: Self-pay

## 2022-02-15 ENCOUNTER — Encounter (HOSPITAL_COMMUNITY): Payer: Self-pay | Admitting: Emergency Medicine

## 2022-02-15 DIAGNOSIS — R519 Headache, unspecified: Secondary | ICD-10-CM | POA: Diagnosis not present

## 2022-02-15 DIAGNOSIS — R29898 Other symptoms and signs involving the musculoskeletal system: Secondary | ICD-10-CM

## 2022-02-15 LAB — CBG MONITORING, ED: Glucose-Capillary: 82 mg/dL (ref 70–99)

## 2022-02-15 NOTE — Discharge Instructions (Signed)
As we discussed, I think the safest thing to do is to go to the emergency room for further evaluation and management. ?

## 2022-02-15 NOTE — ED Triage Notes (Signed)
Pt reports at work and got a headache then pain down left neck and shoulder and caused weakness in left arm around 1130am.  Reports took some of her BP medications due to not taking them yet.  ?Reports around 8 am noticed had hard time with expressing thoughts.  ?

## 2022-02-15 NOTE — ED Provider Notes (Signed)
?[facility] ? ? ? ?CSN: [id number] ?Arrival date & time: 02/15/22  1246 ? ? ?  ? ?History   ?Chief Complaint ?Chief Complaint  ?Patient presents with  ? Headache  ? Extremity Weakness  ? ? ?HPI ?Deanna Sparks is a 46 y.o. female.  ? ?Patient presents today with several different symptoms.  She reports that around 8 AM she developed a very severe left-sided headache that is the worst headache of her life.  She also reports difficulty finding words and expressing her thoughts.  She then developed severe left shoulder pain with weakness in her left arm.  This has since improved but continues to be somewhat bothersome.  She thought symptoms might be related to her blood pressure she denies taking her antihypertensive medication but has since taken this medicine and continues to have pain.  She reports pain is rated 5 on a 0-10 pain scale, no aggravating alleviating factors identified.  She denies any medication changes or head injury.  She has been taking ibuprofen more regularly as she has ongoing tooth pain and this was prescribed by her dentist.  This has not provided any relief of headache symptoms.  She denies history of migraines or neurological condition including MS, TIA, previous stroke.  She does have a history of hypertension as well as hyperlipidemia but denies history of diabetes, smoking, family history of cardiovascular disease.  She is having difficulty with daily activities as a result of symptoms. ? ? ?Past Medical History:  ?Diagnosis Date  ? Genital herpes   ? last outbreak 12/2011  ? Hypertension   ? Vaginal Pap smear, abnormal   ? ? ?Patient Active Problem List  ? Diagnosis Date Noted  ? ASCUS with positive high risk HPV cervical 08/24/2021  ? Well woman exam 08/24/2021  ? Anxiety 06/22/2021  ? OSA on CPAP 06/22/2021  ? Sleeps in sitting position due to orthopnea 03/14/2021  ? Loud snoring 03/14/2021  ? Insufficient sleep syndrome 03/14/2021  ? Nocturia more than twice per night  03/14/2021  ? Caregiver stress syndrome 03/14/2021  ? Other insomnia 03/14/2021  ? Sleep apnea with cognitive complaints 03/14/2021  ? Multinodular goiter (nontoxic) 09/24/2020  ? Essential hypertension 01/21/2019  ? Goiter 01/21/2019  ? Postnasal drip 01/21/2019  ? Plantar fasciitis 10/10/2018  ? Carpal tunnel syndrome 10/22/2015  ? Preterm labor 04/03/2012  ? ? ?Past Surgical History:  ?Procedure Laterality Date  ? CESAREAN SECTION    ? COLPOSCOPY  10/03/2021  ? LIPOMA EXCISION  2008  ? removed from forehead  ? ? ?OB History   ? ? Gravida  ?4  ? Para  ?3  ? Term  ?2  ? Preterm  ?1  ? AB  ?0  ? Living  ?3  ?  ? ? SAB  ?0  ? IAB  ?0  ? Ectopic  ?0  ? Multiple  ?0  ? Live Births  ?1  ?   ?  ?  ? ? ? ?Home Medications   ? ?Prior to Admission medications   ?Medication Sig Start Date End Date Taking? Authorizing Provider  ?acyclovir (ZOVIRAX) 400 MG tablet TAKE 1 TABLET(400 MG) BY MOUTH TWICE DAILY 02/21/21   [place] Chard, MD  ?lisinopril-hydrochlorothiazide (ZESTORETIC) 20-12.5 MG tablet Take 1 tablet by mouth daily. 12/13/21   [place] Chard, MD  ? ? ?Family History ?Family History  ?Problem Relation Age of Onset  ? Hypertension Mother   ? Healthy Father   ? Thyroid disease Brother   ?  Anesthesia problems Neg Hx   ? Other Neg Hx   ? ? ?Social History ?Social History  ? ?Tobacco Use  ? Smoking status: Never  ? Smokeless tobacco: Never  ?Vaping Use  ? Vaping Use: Never used  ?Substance Use Topics  ? Alcohol use: Yes  ?  Comment: occasionally  ? Drug use: Never  ? ? ? ?Allergies   ?Codeine and Ultram [tramadol] ? ? ?Review of Systems ?Review of Systems  ?Constitutional:  Positive for activity change. Negative for appetite change and fatigue.  ?Eyes:  Negative for photophobia and visual disturbance.  ?Respiratory:  Negative for cough and shortness of breath.   ?Cardiovascular:  Negative for chest pain.  ?Gastrointestinal:  Negative for abdominal pain, diarrhea, nausea and vomiting.  ?Neurological:  Positive for speech  difficulty and headaches. Negative for dizziness, syncope, facial asymmetry, weakness, light-headedness and numbness.  ? ? ?Physical Exam ?Triage Vital Signs ?ED Triage Vitals  ?Enc Vitals Group  ?   BP 02/15/22 1357 (!) 158/112  ?   Pulse Rate 02/15/22 1357 80  ?   Resp 02/15/22 1357 18  ?   Temp --   ?   Temp Source 02/15/22 1357 Oral  ?   SpO2 02/15/22 1357 98 %  ?   Weight --   ?   Height --   ?   Head Circumference --   ?   Peak Flow --   ?   Pain Score 02/15/22 1355 5  ?   Pain Loc --   ?   Pain Edu? --   ?   Excl. in [place]? --   ? ?No data found. ? ?Updated Vital Signs ?BP (!) 145/90 (BP Location: Right Arm)   Pulse 80   Temp 99.6 ?F (37.6 ?C)   Resp 18   SpO2 98%  ? ?Visual Acuity ?Right Eye Distance:   ?Left Eye Distance:   ?Bilateral Distance:   ? ?Right Eye Near:   ?Left Eye Near:    ?Bilateral Near:    ? ?Physical Exam ?Vitals reviewed.  ?Constitutional:   ?   General: She is awake. She is not in acute distress. ?   Appearance: Normal appearance. She is well-developed. She is not ill-appearing.  ?   Comments: Very pleasant female appears stated age in no acute distress sitting comfortably in exam room  ?HENT:  ?   Head: Normocephalic and atraumatic. No raccoon eyes, Battle's sign or contusion.  ?   Right Ear: Tympanic membrane, ear canal and external ear normal. No hemotympanum.  ?   Left Ear: Tympanic membrane, ear canal and external ear normal. No hemotympanum.  ?   Nose: Nose normal.  ?   Mouth/Throat:  ?   Tongue: Tongue does not deviate from midline.  ?   Pharynx: Uvula midline. No oropharyngeal exudate or posterior oropharyngeal erythema.  ?Eyes:  ?   Extraocular Movements: Extraocular movements intact.  ?   Conjunctiva/sclera: Conjunctivae normal.  ?   Pupils: Pupils are equal, round, and reactive to light.  ?Cardiovascular:  ?   Rate and Rhythm: Normal rate and regular rhythm.  ?   Heart sounds: Normal heart sounds, S1 normal and S2 normal. No murmur heard. ?Pulmonary:  ?   Effort: Pulmonary  effort is normal.  ?   Breath sounds: Normal breath sounds. No wheezing, rhonchi or rales.  ?   Comments: Clear to auscultation bilaterally ?Musculoskeletal:  ?   Cervical back: No spasms, tenderness or bony tenderness.  No spinous process tenderness or muscular tenderness.  ?   Comments: Strength 5/5 bilateral upper and lower extremities  ?Lymphadenopathy:  ?   Head:  ?   Right side of head: No submental, submandibular or tonsillar adenopathy.  ?   Left side of head: No submental, submandibular or tonsillar adenopathy.  ?Neurological:  ?   General: No focal deficit present.  ?   Mental Status: She is alert.  ?   Cranial Nerves: Cranial nerves 2-12 are intact.  ?   Motor: Motor function is intact.  ?   Coordination: Coordination is intact.  ?   Gait: Gait is intact.  ?   Comments: No focal neurological defect on exam.  ?Psychiatric:     ?   Behavior: Behavior is cooperative.  ? ? ? ?UC Treatments / Results  ?Labs ?(all labs ordered are listed, but only abnormal results are displayed) ?Labs Reviewed  ?CBG MONITORING, ED  ? ? ?EKG ? ? ?Radiology ?No results found. ? ?Procedures ?Procedures (including critical care time) ? ?Medications Ordered in UC ?Medications - No data to display ? ?Initial Impression / Assessment and Plan / UC Course  ?I have reviewed the triage vital signs and the nursing notes. ? ?Pertinent labs & imaging results that were available during my care of the patient were reviewed by me and considered in my medical decision making (see chart for details). ? ?  ? ?Vital signs and physical exam are reassuring today.  Glucose was appropriate.  EKG obtained showed no ischemic changes with normal sinus rhythm and ventricular rate of 74 bpm; compared to 08/02/2021 tracing there is late R wave progression without significant changes.  Discussed that given clinical presentation with dysarthria, focal weakness, worst headache of her life she should go to the emergency room for further evaluation and management.   Patient reports that she is feeling better and declined emergency room evaluation.  Discussed that this is our recommendation and that if she has any recurrent/worsening symptoms it is imperative she goes to which s

## 2022-03-07 DIAGNOSIS — Z1211 Encounter for screening for malignant neoplasm of colon: Secondary | ICD-10-CM | POA: Diagnosis not present

## 2022-03-08 DIAGNOSIS — G4733 Obstructive sleep apnea (adult) (pediatric): Secondary | ICD-10-CM | POA: Diagnosis not present

## 2022-03-14 LAB — COLOGUARD: COLOGUARD: NEGATIVE

## 2022-03-27 DIAGNOSIS — G4733 Obstructive sleep apnea (adult) (pediatric): Secondary | ICD-10-CM | POA: Diagnosis not present

## 2022-04-08 DIAGNOSIS — G4733 Obstructive sleep apnea (adult) (pediatric): Secondary | ICD-10-CM | POA: Diagnosis not present

## 2022-04-10 ENCOUNTER — Other Ambulatory Visit: Payer: Self-pay | Admitting: Internal Medicine

## 2022-04-11 ENCOUNTER — Other Ambulatory Visit: Payer: Self-pay

## 2022-04-11 MED ORDER — ACYCLOVIR 400 MG PO TABS: ORAL_TABLET | ORAL | 2 refills | Status: DC

## 2022-04-24 ENCOUNTER — Ambulatory Visit (INDEPENDENT_AMBULATORY_CARE_PROVIDER_SITE_OTHER): Payer: Medicaid Other | Admitting: Internal Medicine

## 2022-04-24 ENCOUNTER — Encounter: Payer: Self-pay | Admitting: Internal Medicine

## 2022-04-24 VITALS — BP 120/72 | HR 85 | Temp 98.8°F | Ht 71.2 in | Wt 229.2 lb

## 2022-04-24 DIAGNOSIS — Z1211 Encounter for screening for malignant neoplasm of colon: Secondary | ICD-10-CM

## 2022-04-24 DIAGNOSIS — Z6831 Body mass index (BMI) 31.0-31.9, adult: Secondary | ICD-10-CM | POA: Diagnosis not present

## 2022-04-24 DIAGNOSIS — R7309 Other abnormal glucose: Secondary | ICD-10-CM | POA: Diagnosis not present

## 2022-04-24 DIAGNOSIS — G4733 Obstructive sleep apnea (adult) (pediatric): Secondary | ICD-10-CM

## 2022-04-24 DIAGNOSIS — E559 Vitamin D deficiency, unspecified: Secondary | ICD-10-CM | POA: Diagnosis not present

## 2022-04-24 DIAGNOSIS — Z9989 Dependence on other enabling machines and devices: Secondary | ICD-10-CM | POA: Diagnosis not present

## 2022-04-24 DIAGNOSIS — E6609 Other obesity due to excess calories: Secondary | ICD-10-CM

## 2022-04-24 DIAGNOSIS — R682 Dry mouth, unspecified: Secondary | ICD-10-CM

## 2022-04-24 DIAGNOSIS — I10 Essential (primary) hypertension: Secondary | ICD-10-CM

## 2022-04-24 DIAGNOSIS — Z23 Encounter for immunization: Secondary | ICD-10-CM

## 2022-04-24 MED ORDER — LISINOPRIL-HYDROCHLOROTHIAZIDE 20-12.5 MG PO TABS: 1.0000 | ORAL_TABLET | Freq: Every day | ORAL | 2 refills | Status: DC

## 2022-04-24 MED ORDER — TETANUS-DIPHTH-ACELL PERTUSSIS 5-2.5-18.5 LF-MCG/0.5 IM SUSY
0.5000 mL | PREFILLED_SYRINGE | Freq: Once | INTRAMUSCULAR | Status: AC
  Administered 2022-04-24: 0.5 mL via INTRAMUSCULAR

## 2022-04-24 NOTE — Patient Instructions (Addendum)
Leaky gut - cut out sugary foods  Hypertension, Adult Hypertension is another name for high blood pressure. High blood pressure forces your heart to work harder to pump blood. This can cause problems over time. There are two numbers in a blood pressure reading. There is a top number (systolic) over a bottom number (diastolic). It is best to have a blood pressure that is below 120/80. What are the causes? The cause of this condition is not known. Some other conditions can lead to high blood pressure. What increases the risk? Some lifestyle factors can make you more likely to develop high blood pressure: Smoking. Not getting enough exercise or physical activity. Being overweight. Having too much fat, sugar, calories, or salt (sodium) in your diet. Drinking too much alcohol. Other risk factors include: Having any of these conditions: Heart disease. Diabetes. High cholesterol. Kidney disease. Obstructive sleep apnea. Having a family history of high blood pressure and high cholesterol. Age. The risk increases with age. Stress. What are the signs or symptoms? High blood pressure may not cause symptoms. Very high blood pressure (hypertensive crisis) may cause: Headache. Fast or uneven heartbeats (palpitations). Shortness of breath. Nosebleed. Vomiting or feeling like you may vomit (nauseous). Changes in how you see. Very bad chest pain. Feeling dizzy. Seizures. How is this treated? This condition is treated by making healthy lifestyle changes, such as: Eating healthy foods. Exercising more. Drinking less alcohol. Your doctor may prescribe medicine if lifestyle changes do not help enough and if: Your top number is above 130. Your bottom number is above 80. Your personal target blood pressure may vary. Follow these instructions at home: Eating and drinking  If told, follow the DASH eating plan. To follow this plan: Fill one half of your plate at each meal with fruits and  vegetables. Fill one fourth of your plate at each meal with whole grains. Whole grains include whole-wheat pasta, brown rice, and whole-grain bread. Eat or drink low-fat dairy products, such as skim milk or low-fat yogurt. Fill one fourth of your plate at each meal with low-fat (lean) proteins. Low-fat proteins include fish, chicken without skin, eggs, beans, and tofu. Avoid fatty meat, cured and processed meat, or chicken with skin. Avoid pre-made or processed food. Limit the amount of salt in your diet to less than 1,500 mg each day. Do not drink alcohol if: Your doctor tells you not to drink. You are pregnant, may be pregnant, or are planning to become pregnant. If you drink alcohol: Limit how much you have to: 0-1 drink a day for women. 0-2 drinks a day for men. Know how much alcohol is in your drink. In the [place] one drink equals one 12 oz bottle of beer (355 mL), one 5 oz glass of wine (148 mL), or one 1 oz glass of hard liquor (44 mL). Lifestyle  Work with your doctor to stay at a healthy weight or to lose weight. Ask your doctor what the best weight is for you. Get at least 30 minutes of exercise that causes your heart to beat faster (aerobic exercise) most days of the week. This may include walking, swimming, or biking. Get at least 30 minutes of exercise that strengthens your muscles (resistance exercise) at least 3 days a week. This may include lifting weights or doing Pilates. Do not smoke or use any products that contain nicotine or tobacco. If you need help quitting, ask your doctor. Check your blood pressure at home as told by your doctor. Keep all  follow-up visits. Medicines Take over-the-counter and prescription medicines only as told by your doctor. Follow directions carefully. Do not skip doses of blood pressure medicine. The medicine does not work as well if you skip doses. Skipping doses also puts you at risk for problems. Ask your doctor about side effects or  reactions to medicines that you should watch for. Contact a doctor if: You think you are having a reaction to the medicine you are taking. You have headaches that keep coming back. You feel dizzy. You have swelling in your ankles. You have trouble with your vision. Get help right away if: You get a very bad headache. You start to feel mixed up (confused). You feel weak or numb. You feel faint. You have very bad pain in your: Chest. Belly (abdomen). You vomit more than once. You have trouble breathing. These symptoms may be an emergency. Get help right away. Call 911. Do not wait to see if the symptoms will go away. Do not drive yourself to the hospital. Summary Hypertension is another name for high blood pressure. High blood pressure forces your heart to work harder to pump blood. For most people, a normal blood pressure is less than 120/80. Making healthy choices can help lower blood pressure. If your blood pressure does not get lower with healthy choices, you may need to take medicine. This information is not intended to replace advice given to you by your health care provider. Make sure you discuss any questions you have with your health care provider. Document Revised: 07/28/2021 Document Reviewed: 07/28/2021 Elsevier Patient Education  [place].

## 2022-04-24 NOTE — Progress Notes (Unsigned)
I,Jameka J Llittleton,acting as a scribe for Robyn N Sanders, MD.,have documented all relevant documentation on the behalf of Robyn N Sanders, MD,as directed by  Robyn N Sanders, MD while in the presence of Robyn N Sanders, MD.  This visit occurred during the SARS-CoV-2 public health emergency.  Safety protocols were in place, including screening questions prior to the visit, additional usage of staff PPE, and extensive cleaning of exam room while observing appropriate contact time as indicated for disinfecting solutions.  Subjective:     Patient ID: Deanna Sparks , female    DOB: 11/02/1975 , 46 y.o.   MRN: [id number]   Chief Complaint  Patient presents with   Hypertension    HPI  The patient is here today for a blood pressure f/u.  She reports compliance with meds. She denies headaches, chest pain and shortness of breath.  Patient wanted to know if she could get a shingles vaccine today.   Hypertension This is a chronic problem. The current episode started more than 1 year ago. The problem has been gradually improving since onset. The problem is controlled. Pertinent negatives include no anxiety, blurred vision, palpitations or shortness of breath.     Past Medical History:  Diagnosis Date   Genital herpes    last outbreak 12/2011   Hypertension    Vaginal Pap smear, abnormal      Family History  Problem Relation Age of Onset   Hypertension Mother    Healthy Father    Thyroid disease Brother    Anesthesia problems Neg Hx    Other Neg Hx      Current Outpatient Medications:    acyclovir (ZOVIRAX) 400 MG tablet, TAKE 1 TABLET(400 MG) BY MOUTH TWICE DAILY, Disp: 360 tablet, Rfl: 2   lisinopril-hydrochlorothiazide (ZESTORETIC) 20-12.5 MG tablet, Take 1 tablet by mouth daily., Disp: 90 tablet, Rfl: 2  Current Facility-Administered Medications:    Tdap (BOOSTRIX) injection 0.5 mL, 0.5 mL, Intramuscular, Once, Sanders, Robyn, MD   Allergies  Allergen Reactions   Codeine  Itching   Ultram [Tramadol] Itching     Review of Systems  Constitutional: Negative.   Eyes:  Negative for blurred vision.  Respiratory: Negative.  Negative for shortness of breath.   Cardiovascular: Negative.  Negative for palpitations.  Neurological: Negative.   Psychiatric/Behavioral: Negative.       Today's Vitals   04/24/22 1605  BP: 120/72  Pulse: 85  Temp: 98.8 F (37.1 C)  Weight: 229 lb 3.2 oz (104 kg)  Height: 5' 11.2" (1.808 m)  PainSc: 0-No pain   Body mass index is 31.79 kg/m.  Wt Readings from Last 3 Encounters:  04/24/22 229 lb 3.2 oz (104 kg)  10/03/21 224 lb 11.2 oz (101.9 kg)  08/24/21 223 lb 8 oz (101.4 kg)     Objective:  Physical Exam Vitals and nursing note reviewed.  Constitutional:      Appearance: Normal appearance.  HENT:     Head: Normocephalic and atraumatic.  Eyes:     Extraocular Movements: Extraocular movements intact.  Cardiovascular:     Rate and Rhythm: Normal rate and regular rhythm.     Heart sounds: Normal heart sounds.  Pulmonary:     Effort: Pulmonary effort is normal.     Breath sounds: Normal breath sounds.  Musculoskeletal:     Cervical back: Normal range of motion.  Skin:    General: Skin is warm.  Neurological:     General: No focal deficit present.       Mental Status: She is alert.  Psychiatric:        Mood and Affect: Mood normal.        Behavior: Behavior normal.       Assessment And Plan:     1. Essential hypertension - CMP14+EGFR  2. OSA on CPAP  3. Dry mouth  4. Class 1 obesity due to excess calories with serious comorbidity and body mass index (BMI) of 31.0 to 31.9 in adult  5. Screen for colon cancer  6. Need for Tdap vaccination - Tdap (BOOSTRIX) injection 0.5 mL  7. Vitamin D deficiency disease - Vitamin D (25 hydroxy)  8. Other abnormal glucose - Hemoglobin A1c     Patient was given opportunity to ask questions. Patient verbalized understanding of the plan and was able to repeat key  elements of the plan. All questions were answered to their satisfaction.  Maximino Greenland, MD   I, Maximino Greenland, MD, have reviewed all documentation for this visit. The documentation on 04/24/22 for the exam, diagnosis, procedures, and orders are all accurate and complete.   IF YOU HAVE BEEN REFERRED TO A SPECIALIST, IT MAY TAKE 1-2 WEEKS TO SCHEDULE/PROCESS THE REFERRAL. IF YOU HAVE NOT HEARD FROM US/SPECIALIST IN TWO WEEKS, PLEASE GIVE [place] A CALL AT [phone].   THE PATIENT IS ENCOURAGED TO PRACTICE SOCIAL DISTANCING DUE TO THE COVID-19 PANDEMIC.

## 2022-04-25 LAB — CMP14+EGFR
ALT: 19 IU/L (ref 0–32)
AST: 21 IU/L (ref 0–40)
Albumin/Globulin Ratio: 1.4 (ref 1.2–2.2)
Albumin: 4.2 g/dL (ref 3.8–4.8)
Alkaline Phosphatase: 59 IU/L (ref 44–121)
BUN/Creatinine Ratio: 18 (ref 9–23)
BUN: 17 mg/dL (ref 6–24)
Bilirubin Total: 0.4 mg/dL (ref 0.0–1.2)
CO2: 23 mmol/L (ref 20–29)
Calcium: 9.3 mg/dL (ref 8.7–10.2)
Chloride: 104 mmol/L (ref 96–106)
Creatinine, Ser: 0.93 mg/dL (ref 0.57–1.00)
Globulin, Total: 2.9 g/dL (ref 1.5–4.5)
Glucose: 98 mg/dL (ref 70–99)
Potassium: 3.9 mmol/L (ref 3.5–5.2)
Sodium: 143 mmol/L (ref 134–144)
Total Protein: 7.1 g/dL (ref 6.0–8.5)
eGFR: 77 mL/min/{1.73_m2} (ref >59)

## 2022-04-25 LAB — VITAMIN D 25 HYDROXY (VIT D DEFICIENCY, FRACTURES): Vit D, 25-Hydroxy: 20 ng/mL — ABNORMAL LOW (ref 30.0–100.0)

## 2022-04-25 LAB — HEMOGLOBIN A1C
Est. average glucose Bld gHb Est-mCnc: 120 mg/dL
Hgb A1c MFr Bld: 5.8 % — ABNORMAL HIGH (ref 4.8–5.6)

## 2022-04-27 ENCOUNTER — Encounter: Payer: Medicaid Other | Admitting: Certified Nurse Midwife

## 2022-04-28 ENCOUNTER — Encounter: Payer: Self-pay | Admitting: Internal Medicine

## 2022-05-08 DIAGNOSIS — G4733 Obstructive sleep apnea (adult) (pediatric): Secondary | ICD-10-CM | POA: Diagnosis not present

## 2022-05-09 ENCOUNTER — Other Ambulatory Visit: Payer: Self-pay

## 2022-05-09 MED ORDER — FLUCONAZOLE 150 MG PO TABS: 150.0000 mg | ORAL_TABLET | Freq: Every day | ORAL | 0 refills | Status: DC

## 2022-06-08 DIAGNOSIS — G4733 Obstructive sleep apnea (adult) (pediatric): Secondary | ICD-10-CM | POA: Diagnosis not present

## 2022-06-15 ENCOUNTER — Encounter: Payer: Medicaid Other | Admitting: Obstetrics and Gynecology

## 2022-06-19 ENCOUNTER — Telehealth (INDEPENDENT_AMBULATORY_CARE_PROVIDER_SITE_OTHER): Payer: Medicaid Other | Admitting: Internal Medicine

## 2022-06-19 ENCOUNTER — Encounter: Payer: Self-pay | Admitting: Internal Medicine

## 2022-06-19 DIAGNOSIS — M79605 Pain in left leg: Secondary | ICD-10-CM

## 2022-06-19 DIAGNOSIS — M5442 Lumbago with sciatica, left side: Secondary | ICD-10-CM

## 2022-06-19 MED ORDER — PREDNISONE 5 MG (21) PO TBPK
ORAL_TABLET | ORAL | 0 refills | Status: DC
Start: 2022-06-19 — End: 2022-07-04

## 2022-06-19 MED ORDER — CYCLOBENZAPRINE HCL 10 MG PO TABS: ORAL_TABLET | ORAL | 0 refills | Status: DC

## 2022-06-19 MED ORDER — MELOXICAM 7.5 MG PO TABS
7.5000 mg | ORAL_TABLET | Freq: Every day | ORAL | 0 refills | Status: DC
Start: 2022-06-19 — End: 2022-08-09

## 2022-06-19 NOTE — Progress Notes (Signed)
Virtual Visit via Video   This visit type was conducted due to national recommendations for restrictions regarding the COVID-19 Pandemic (e.g. social distancing) in an effort to limit this patient's exposure and mitigate transmission in our community.  Due to her co-morbid illnesses, this patient is at least at moderate risk for complications without adequate follow up.  This format is felt to be most appropriate for this patient at this time.  All issues noted in this document were discussed and addressed.  A limited physical exam was performed with this format.    This visit type was conducted due to national recommendations for restrictions regarding the COVID-19 Pandemic (e.g. social distancing) in an effort to limit this patient's exposure and mitigate transmission in our community.  Patients identity confirmed using two different identifiers.  This format is felt to be most appropriate for this patient at this time.  All issues noted in this document were discussed and addressed.  No physical exam was performed (except for noted visual exam findings with Video Visits).    Date:  07/09/2022   ID:  Deanna Sparks, DOB 1976-06-14, MRN [id number]  Patient Location:  Home  Provider location:   Office    Chief Complaint:  "I have back/leg pain"  History of Present Illness:    Deanna Sparks is a 46 y.o. female who presents via video conferencing for a telehealth visit today.    e patient does not have symptoms concerning for COVID-19 infection (fever, chills, cough, or new shortness of breath).   She presents today for virtual visit. She prefers this method of contact due to COVID-19 pandemic.   Patient presents today for leg pain along with burning pain in her left leg.  She denies fall/trauma. States pain starts in her buttocks region and then radiates down her left leg. , left leg. 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 She thinks may have pulled  something. She is concerned because her sx have not resolved completely. She did have minimal relief with heating paid and ibuprofen. Pain radiates down to behind her left knee, then radiates to lateral part of her leg midway down her lower leg. She does admit to LLE paresthesias, but denies LLE weakness.    Leg Pain  The incident occurred more than 1 week ago. The incident occurred at a nursing home. The injury mechanism was a burn. The pain is present in the left leg. The quality of the pain is described as burning and aching.     Past Medical History:  Diagnosis Date   Genital herpes    last outbreak 12/2011   Hypertension    Vaginal Pap smear, abnormal    Past Surgical History:  Procedure Laterality Date   CESAREAN SECTION     COLPOSCOPY  10/03/2021   LIPOMA EXCISION  2008   removed from forehead     Current Meds  Medication Sig   acyclovir (ZOVIRAX) 400 MG tablet TAKE 1 TABLET(400 MG) BY MOUTH TWICE DAILY   lisinopril-hydrochlorothiazide (ZESTORETIC) 20-12.5 MG tablet Take 1 tablet by mouth daily.   meloxicam (MOBIC) 7.5 MG tablet Take 1 tablet (7.5 mg total) by mouth daily. prn   [DISCONTINUED] cyclobenzaprine (FLEXERIL) 10 MG tablet One tab po qpm prn back pain   [DISCONTINUED] predniSONE (STERAPRED UNI-PAK 21 TAB) 5 MG (21) TBPK tablet Use as directed, dispense as a 6day dose pack     Allergies:   Codeine and Ultram [  tramadol]   Social History   Tobacco Use   Smoking status: Never   Smokeless tobacco: Never  Vaping Use   Vaping Use: Never used  Substance Use Topics   Alcohol use: Yes    Comment: occasionally   Drug use: Never     Family Hx: The patient's family history includes Healthy in her father; Hypertension in her mother; Thyroid disease in her brother. There is no history of Anesthesia problems or Other.  ROS:   Please see the history of present illness.    Review of Systems  Constitutional: Negative.   Eyes: Negative.   Respiratory: Negative.     Genitourinary: Negative.   Skin: Negative.   Neurological: Negative.   Endo/Heme/Allergies: Negative.   Psychiatric/Behavioral: Negative.      All other systems reviewed and are negative.   Labs/Other Tests and Data Reviewed:    Recent Labs: 08/02/2021: Hemoglobin 13.7; Platelets 276 04/24/2022: ALT 19; BUN 17; Creatinine, Ser 0.93; Potassium 3.9; Sodium 143   Recent Lipid Panel Lab Results  Component Value Date/Time   CHOL 170 08/02/2021 11:49 AM   TRIG 109 08/02/2021 11:49 AM   HDL 43 08/02/2021 11:49 AM   CHOLHDL 4.0 08/02/2021 11:49 AM   LDLCALC 107 (H) 08/02/2021 11:49 AM    Wt Readings from Last 3 Encounters:  04/24/22 229 lb 3.2 oz (104 kg)  10/03/21 224 lb 11.2 oz (101.9 kg)  08/24/21 223 lb 8 oz (101.4 kg)     Exam:    Vital Signs:  LMP 06/07/2022 (Exact Date)     Physical Exam Vitals and nursing note reviewed.  HENT:     Head: Normocephalic and atraumatic.  Pulmonary:     Effort: Pulmonary effort is normal.  Musculoskeletal:        General: Normal range of motion.  Neurological:     Mental Status: She is alert and oriented to person, place, and time.  Psychiatric:        Mood and Affect: Affect normal.     ASSESSMENT & PLAN:    1. Acute left-sided low back pain with left-sided sciatica Comments: I will send rx prednisone dose pack and cyclobenzaprine to use nightly prn. She will let me know if her sx persist/worsen.   2. Left leg pain Comments: Please see above. Advised to take meloxicam daily prn with food. She was also encouraged to perform stretching exercises daily.   COVID-19 Education: The signs and symptoms of COVID-19 were discussed with the patient and how to seek care for testing (follow up with PCP or arrange E-visit).  The importance of social distancing was discussed today.  Patient Risk:   After full review of this patients clinical status, I feel that they are at least moderate risk at this time.  Time:   Today, I have spent  13 minutes/ 34 seconds with the patient with telehealth technology discussing above diagnoses.     Medication Adjustments/Labs and Tests Ordered: Current medicines are reviewed at length with the patient today.  Concerns regarding medicines are outlined above.   Tests Ordered: No orders of the defined types were placed in this encounter.   Medication Changes: Meds ordered this encounter  Medications   DISCONTD: predniSONE (STERAPRED UNI-PAK 21 TAB) 5 MG (21) TBPK tablet    Sig: Use as directed, dispense as a 6day dose pack    Dispense:  21 each    Refill:  0   DISCONTD: cyclobenzaprine (FLEXERIL) 10 MG tablet    Sig:  One tab po qpm prn back pain    Dispense:  30 tablet    Refill:  0   meloxicam (MOBIC) 7.5 MG tablet    Sig: Take 1 tablet (7.5 mg total) by mouth daily. prn    Dispense:  30 tablet    Refill:  0    Disposition:  Follow up prn  Signed, [name], MD

## 2022-06-19 NOTE — Patient Instructions (Signed)
Sciatica  Sciatica is pain, weakness, tingling, or loss of feeling (numbness) along the sciatic nerve. The sciatic nerve starts in the lower back and goes down the back of each leg. Sciatica usually affects one side of the body. Sciatica usually goes away on its own or with treatment. Sometimes, sciatica may come back. What are the causes? This condition happens when the sciatic nerve is pinched or has pressure put on it. This may be caused by: A disk in between the bones of the spine bulging out too far (herniated disk). Changes in the spinal disks due to aging. A condition that affects a muscle in the butt. Extra bone growth near the sciatic nerve. A break (fracture) of the area between your hip bones (pelvis). Pregnancy. Tumor. This is rare. What increases the risk? You are more likely to develop this condition if you: Play sports that put pressure or stress on the spine. Have poor strength and ease of movement (flexibility). Have had a back injury or back surgery. Sit for long periods of time. Do activities that involve bending or lifting over and over again. Are very overweight (obese). What are the signs or symptoms? Symptoms can vary from mild to very bad. They may include: Any of these problems in the lower back, leg, hip, or butt: Mild tingling, loss of feeling, or dull aches. A burning feeling. Sharp pains. Loss of feeling in the back of the calf or the sole of the foot. Leg weakness. Very bad back pain that makes it hard to move. These symptoms may get worse when you cough, sneeze, or laugh. They may also get worse when you sit or stand for long periods of time. How is this treated? This condition often gets better without any treatment. However, treatment may include: Changing or cutting back on physical activity when you have pain. Exercising, including strengthening and stretching. Putting ice or heat on the affected area. Shots of medicines to relieve pain and  swelling or to relax your muscles. Surgery. Follow these instructions at home: Medicines Take over-the-counter and prescription medicines only as told by your doctor. Ask your doctor if you should avoid driving or using machines while you are taking your medicine. Managing pain     If told, put ice on the affected area. To do this: Put ice in a plastic bag. Place a towel between your skin and the bag. Leave the ice on for 20 minutes, 2-3 times a day. If your skin turns bright red, take off the ice right away to prevent skin damage. The risk of skin damage is higher if you cannot feel pain, heat, or cold. If told, put heat on the affected area. Do this as often as told by your doctor. Use the heat source that your doctor tells you to use, such as a moist heat pack or a heating pad. Place a towel between your skin and the heat source. Leave the heat on for 20-30 minutes. If your skin turns bright red, take off the heat right away to prevent burns. The risk of burns is higher if you cannot feel pain, heat, or cold. Activity  Return to your normal activities when your doctor says that it is safe. Avoid activities that make your symptoms worse. Take short rests during the day. When you rest for a long time, do some physical activity or stretching between periods of rest. Avoid sitting for a long time without moving. Get up and move around at least one time each   hour. Do exercises and stretches as told by your doctor. Do not lift anything that is heavier than 10 lb (4.5 kg). Avoid lifting heavy things even when you do not have symptoms. Avoid lifting heavy things over and over. When you lift objects, always lift in a way that is safe for your body. To do this, you should: Bend your knees. Keep the object close to your body. Avoid twisting. General instructions Stay at a healthy weight. Wear comfortable shoes that support your feet. Avoid wearing high heels. Avoid sleeping on a mattress  that is too soft or too hard. You might have less pain if you sleep on a mattress that is firm enough to support your back. Contact a doctor if: Your pain is not controlled by medicine. Your pain does not get better. Your pain gets worse. Your pain lasts longer than 4 weeks. You lose weight without trying. Get help right away if: You cannot control when you pee (urinate) or poop (have a bowel movement). You have weakness in any of these areas and it gets worse: Lower back. The area between your hip bones. Butt. Legs. You have redness or swelling of your back. You have a burning feeling when you pee. Summary Sciatica is pain, weakness, tingling, or loss of feeling (numbness) along the sciatic nerve. This may include the lower back, legs, hips, and butt. This condition happens when the sciatic nerve is pinched or has pressure put on it. Treatment often includes rest, exercise, medicines, and putting ice or heat on the affected area. This information is not intended to replace advice given to you by your health care provider. Make sure you discuss any questions you have with your health care provider. Document Revised: 01/16/2022 Document Reviewed: 01/16/2022 Elsevier Patient Education  2023 [employer]  

## 2022-07-04 ENCOUNTER — Encounter (HOSPITAL_COMMUNITY): Payer: Self-pay

## 2022-07-04 ENCOUNTER — Ambulatory Visit (HOSPITAL_COMMUNITY)
Admission: EM | Admit: 2022-07-04 | Discharge: 2022-07-04 | Disposition: A | Discharge status: Home / Self Care | Payer: Medicaid Other | Attending: Emergency Medicine | Admitting: Emergency Medicine

## 2022-07-04 DIAGNOSIS — R519 Headache, unspecified: Secondary | ICD-10-CM

## 2022-07-04 DIAGNOSIS — M542 Cervicalgia: Secondary | ICD-10-CM | POA: Diagnosis not present

## 2022-07-04 MED ORDER — CYCLOBENZAPRINE HCL 10 MG PO TABS: 10.0000 mg | ORAL_TABLET | Freq: Every day | ORAL | 0 refills | Status: DC

## 2022-07-04 MED ORDER — DEXAMETHASONE SODIUM PHOSPHATE 10 MG/ML IJ SOLN
10.0000 mg | Freq: Once | INTRAMUSCULAR | Status: AC
Start: 2022-07-04 — End: 2022-07-04
  Administered 2022-07-04: 10 mg via INTRAMUSCULAR

## 2022-07-04 MED ORDER — PREDNISONE 10 MG (21) PO TBPK: ORAL_TABLET | Freq: Every day | ORAL | 0 refills | Status: DC

## 2022-07-04 MED ORDER — DEXAMETHASONE SODIUM PHOSPHATE 10 MG/ML IJ SOLN
INTRAMUSCULAR | Status: AC
  Filled 2022-07-04: qty 1

## 2022-07-04 NOTE — ED Provider Notes (Signed)
[facility]    CSN: [id number] Arrival date & time: 07/04/22  1406      History   Chief Complaint Chief Complaint  Patient presents with   Headache    HPI Deanna Sparks is a 46 y.o. female.   Patient presents with posterior neck pain beginning 1 day ago and upon awakening.  Symptoms are worse when turning head from side to side.  Pain radiates to the posterior and top of head causing a bad headache.  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  Denies numbness or tingling, dizziness, lightheadedness, visual changes, weakness, memory or speech changes.  Has attempted use of the hydrocodone which was helpful in minimizing pain from a 10 to a 5.  Has also attempted use of 30 mg of meloxicam since this morning.   Past Medical History:  Diagnosis Date   Genital herpes    last outbreak 12/2011   Hypertension    Vaginal Pap smear, abnormal     Patient Active Problem List   Diagnosis Date Noted   ASCUS with positive high risk HPV cervical 08/24/2021   Well woman exam 08/24/2021   Anxiety 06/22/2021   OSA on CPAP 06/22/2021   Sleeps in sitting position due to orthopnea 03/14/2021   Loud snoring 03/14/2021   Insufficient sleep syndrome 03/14/2021   Nocturia more than twice per night 03/14/2021   Caregiver stress syndrome 03/14/2021   Other insomnia 03/14/2021   Sleep apnea with cognitive complaints 03/14/2021   Multinodular goiter (nontoxic) 09/24/2020   Essential hypertension 01/21/2019   Goiter 01/21/2019   Postnasal drip 01/21/2019   Plantar fasciitis 10/10/2018   Carpal tunnel syndrome 10/22/2015   Preterm labor 04/03/2012    Past Surgical History:  Procedure Laterality Date   CESAREAN SECTION     COLPOSCOPY  10/03/2021   LIPOMA EXCISION  2008   removed from forehead    OB History     Gravida  4   Para  3   Term  2   Preterm  1   AB  0   Living  3      SAB  0   IAB  0   Ectopic  0    Multiple  0   Live Births  1            Home Medications    Prior to Admission medications   Medication Sig Start Date End Date Taking? Authorizing Provider  acyclovir (ZOVIRAX) 400 MG tablet TAKE 1 TABLET(400 MG) BY MOUTH TWICE DAILY 04/11/22   Dorothyann Peng, MD  cyclobenzaprine (FLEXERIL) 10 MG tablet One tab po qpm prn back pain 06/19/22   Dorothyann Peng, MD  fluconazole (DIFLUCAN) 150 MG tablet Take 1 tablet (150 mg total) by mouth daily. Patient not taking: Reported on 06/19/2022 05/09/22   Dorothyann Peng, MD  lisinopril-hydrochlorothiazide (ZESTORETIC) 20-12.5 MG tablet Take 1 tablet by mouth daily. 04/24/22   Dorothyann Peng, MD  meloxicam (MOBIC) 7.5 MG tablet Take 1 tablet (7.5 mg total) by mouth daily. prn 06/19/22   Dorothyann Peng, MD  predniSONE (STERAPRED UNI-PAK 21 TAB) 5 MG (21) TBPK tablet Use as directed, dispense as a 6day dose pack 06/19/22   Dorothyann Peng, MD    Family History Family History  Problem Relation Age of Onset   Hypertension Mother    Healthy Father    Thyroid disease Brother    Anesthesia problems Neg Hx  Other Neg Hx     Social History Social History   Tobacco Use   Smoking status: Never   Smokeless tobacco: Never  Vaping Use   Vaping Use: Never used  Substance Use Topics   Alcohol use: Yes    Comment: occasionally   Drug use: Never     Allergies   Codeine and Ultram [tramadol]   Review of Systems Review of Systems  Musculoskeletal:  Positive for neck pain. Negative for arthralgias, back pain, gait problem, joint swelling, myalgias and neck stiffness.  Neurological:  Positive for headaches. Negative for dizziness, tremors, seizures, syncope, facial asymmetry, speech difficulty, weakness, light-headedness and numbness.     Physical Exam Triage Vital Signs ED Triage Vitals  Enc Vitals Group     BP 07/04/22 1522 128/84     Pulse Rate 07/04/22 1522 80     Resp 07/04/22 1522 17     Temp 07/04/22 1522 98.1 F (36.7 C)     Temp  src --      SpO2 07/04/22 1522 98 %     Weight --      Height --      Head Circumference --      Peak Flow --      Pain Score 07/04/22 1521 5     Pain Loc --      Pain Edu? --      Excl. in GC? --    No data found.  Updated Vital Signs BP 128/84   Pulse 80   Temp 98.1 F (36.7 C)   Resp 17   LMP 06/07/2022 (Exact Date)   SpO2 98%   Visual Acuity Right Eye Distance:   Left Eye Distance:   Bilateral Distance:    Right Eye Near:   Left Eye Near:    Bilateral Near:     Physical Exam Constitutional:      Appearance: She is well-developed.  HENT:     Head: Normocephalic.  Neck:     Comments: No spinal tenderness noted, tenderness present along the base of the lateral aspects of the neck, range of motion is intact, 2+ carotid pulses, no rigidity or crepitus present Neurological:     General: No focal deficit present.     Mental Status: She is alert and oriented to person, place, and time. Mental status is at baseline.     Cranial Nerves: No cranial nerve deficit.     Motor: No weakness.  Psychiatric:        Mood and Affect: Mood normal.        Behavior: Behavior normal.      UC Treatments / Results  Labs (all labs ordered are listed, but only abnormal results are displayed) Labs Reviewed - No data to display  EKG   Radiology No results found.  Procedures Procedures (including critical care time)  Medications Ordered in UC Medications - No data to display  Initial Impression / Assessment and Plan / UC Course  I have reviewed the triage vital signs and the nursing notes.  Pertinent labs & imaging results that were available during my care of the patient were reviewed by me and considered in my medical decision making (see chart for details).  Neck pain, bad headache  Etiology appears to be muscular, no neurological abnormalities, discussed with patient, will avoid NSAIDs and advised patient to not take any for the remainder of the day if she is already  exceeded the daily dosage, Decadron injection given in office  and prescribed prednisone taper and Flexeril, recommended RICE, heat, massage, stretching and activity as tolerated, work note given and given walker referral to orthopedics if symptoms persist or worsen Final Clinical Impressions(s) / UC Diagnoses   Final diagnoses:  None   Discharge Instructions   None    ED Prescriptions   None    PDMP not reviewed this encounter.   Hans Eden, NP 07/04/22 1605

## 2022-07-04 NOTE — Discharge Instructions (Addendum)
For your headache and neck pain  -On exam your pain is primarily over your neck and shoulder muscles and there is no tenderness directly over your spine -On exam there are no abnormalities neurologically -You have been given an injection of  Decadron here in the office today to help minimize your symptoms -Starting tomorrow take prednisone every morning as directed to reduce inflammation that occurs with injury, please avoid use of meloxicam, naproxen, ibuprofen, Aleve and Advil while using this medication, may use Tylenol 325 to 1000 mg every 6 hours for additional comfort -May use Flexeril at bedtime, be mindful this medication will make you drowsy -While headaches are present ensure that you are getting adequate rest and adequate fluid intake -Participate in low stimulation activities avoiding bright lights and loud noises when symptoms are present -If your headaches continue to persist please follow-up with your primary doctor for reevaluation -At any point if you have the worst headache of your life please go to the nearest emergency department for immediate evaluation

## 2022-07-04 NOTE — ED Triage Notes (Signed)
Pt presents with complaints of stiff neck that started yesterday morning, she though she slept wrong. Today she is having pain in her ears, neck, and headache. Pt reports taking meloxicam 7.5 mg x 4 today and a hydrocodone and pain went from a 10 to a 5/10.

## 2022-07-09 DIAGNOSIS — G4733 Obstructive sleep apnea (adult) (pediatric): Secondary | ICD-10-CM | POA: Diagnosis not present

## 2022-07-11 DIAGNOSIS — G5603 Carpal tunnel syndrome, bilateral upper limbs: Secondary | ICD-10-CM | POA: Diagnosis not present

## 2022-07-24 ENCOUNTER — Ambulatory Visit (INDEPENDENT_AMBULATORY_CARE_PROVIDER_SITE_OTHER): Payer: Medicaid Other | Admitting: Internal Medicine

## 2022-07-24 ENCOUNTER — Encounter: Payer: Self-pay | Admitting: Internal Medicine

## 2022-07-24 VITALS — BP 148/100 | HR 80 | Temp 98.3°F | Ht 71.0 in | Wt 221.2 lb

## 2022-07-24 DIAGNOSIS — R0981 Nasal congestion: Secondary | ICD-10-CM | POA: Diagnosis not present

## 2022-07-24 DIAGNOSIS — I10 Essential (primary) hypertension: Secondary | ICD-10-CM | POA: Diagnosis not present

## 2022-07-24 LAB — POC COVID19 BINAXNOW: SARS Coronavirus 2 Ag: NEGATIVE

## 2022-07-24 NOTE — Patient Instructions (Signed)

## 2022-07-24 NOTE — Progress Notes (Signed)
Hershal Coria Martin,acting as a Neurosurgeon for [name], MD.,have documented all relevant documentation on the behalf of [name], MD,as directed by  [name], MD while in the presence of [name], MD.    Subjective:     Patient ID: Deanna Sparks , female    DOB: 1976-04-08 , 46 y.o.   MRN: [id number]   Chief Complaint  Patient presents with   Sinus Problem    HPI  Pt presents today with sinus complaints. Patient states she is having sinus headaches, congestion, drainage and her ears hurt. She denies fever/chills, ill contacts. She denies having any h/o allergies. Patient states she has been having these issues for 3 weeks. She took Sudafed w/o relief of her sx. Patient states her blood pressure was also high last night. Therefore, she took an amlodipine, this had been stopped previously because her BP had improved.   BP Readings from Last 3 Encounters: 07/24/22 : (!) 140/100 07/04/22 : 128/84 04/24/22 : 120/72    Sinus Problem This is a chronic problem. There has been no fever. Associated symptoms include congestion and sinus pressure. Pertinent negatives include no hoarse voice or sneezing. Past treatments include oral decongestants. The treatment provided no relief.     Past Medical History:  Diagnosis Date   Genital herpes    last outbreak 12/2011   Hypertension    Vaginal Pap smear, abnormal      Family History  Problem Relation Age of Onset   Hypertension Mother    Healthy Father    Thyroid disease Brother    Anesthesia problems Neg Hx    Other Neg Hx      Current Outpatient Medications:    cyclobenzaprine (FLEXERIL) 10 MG tablet, Take 1 tablet (10 mg total) by mouth at bedtime., Disp: 10 tablet, Rfl: 0   lisinopril-hydrochlorothiazide (ZESTORETIC) 20-12.5 MG tablet, Take 1 tablet by mouth daily., Disp: 90 tablet, Rfl: 2   meloxicam (MOBIC) 7.5 MG tablet, Take 1 tablet (7.5 mg total) by mouth daily. prn, Disp: 30 tablet, Rfl: 0    acyclovir (ZOVIRAX) 400 MG tablet, TAKE 1 TABLET(400 MG) BY MOUTH TWICE DAILY (Patient not taking: Reported on 07/24/2022), Disp: 360 tablet, Rfl: 2   fluconazole (DIFLUCAN) 150 MG tablet, Take 1 tablet (150 mg total) by mouth daily. (Patient not taking: Reported on 06/19/2022), Disp: 1 tablet, Rfl: 0   predniSONE (STERAPRED UNI-PAK 21 TAB) 10 MG (21) TBPK tablet, Take by mouth daily. Take 6 tabs by mouth daily  for 1 days, then 5 tabs for 1 days, then 4 tabs for 1 days, then 3 tabs for 1 days, 2 tabs for 1 days, then 1 tab by mouth daily for 1 days (Patient not taking: Reported on 07/24/2022), Disp: 42 tablet, Rfl: 0   Allergies  Allergen Reactions   Codeine Itching   Ultram [Tramadol] Itching     Review of Systems  Constitutional: Negative.   HENT:  Positive for congestion and sinus pressure. Negative for hoarse voice and sneezing.   Eyes: Negative.   Respiratory: Negative.    Cardiovascular: Negative.   Gastrointestinal: Negative.      Today's Vitals   07/24/22 0925 07/24/22 0934 07/24/22 0953  BP: (!) 140/100 (!) 140/98 (!) 148/100  Pulse: 80    Temp: 98.3 F (36.8 C)    TempSrc: Oral    SpO2: 97%    Weight: 221 lb 3.2 oz (100.3 kg)    Height: 5\' 11"  (1.803 m)  PainSc: 4     PainLoc: Head     Body mass index is 30.85 kg/m.  Wt Readings from Last 3 Encounters:  07/24/22 221 lb 3.2 oz (100.3 kg)  04/24/22 229 lb 3.2 oz (104 kg)  10/03/21 224 lb 11.2 oz (101.9 kg)     Objective:  Physical Exam Vitals and nursing note reviewed.  Constitutional:      Appearance: Normal appearance.  HENT:     Head: Normocephalic and atraumatic.     Right Ear: Tympanic membrane, ear canal and external ear normal. There is no impacted cerumen.     Left Ear: Tympanic membrane, ear canal and external ear normal. There is no impacted cerumen.     Nose:     Comments: Masked     Mouth/Throat:     Comments: Masked  Eyes:     Extraocular Movements: Extraocular movements intact.   Cardiovascular:     Rate and Rhythm: Normal rate and regular rhythm.     Heart sounds: Normal heart sounds.  Pulmonary:     Effort: Pulmonary effort is normal.     Breath sounds: Normal breath sounds.  Musculoskeletal:     Cervical back: Normal range of motion.  Skin:    General: Skin is warm.  Neurological:     General: No focal deficit present.     Mental Status: She is alert.  Psychiatric:        Mood and Affect: Mood normal.        Behavior: Behavior normal.         Assessment And Plan:     1. Sinus congestion Comments: I think her sx are due to allergies. She was given samples of Zyrtec, 10mg  to use nightly. She will let me know if her sx persist. Rapid Covid is neg.  - POC COVID-19 BinaxNow  2. Essential hypertension Comments: Uncontrolled, advised to stop Sudafed. She will c/w lisinopril/hctz 20/12.5mg  qd. She will f/u in 2 weeks for CPE. If needed, will resume amlodipine qpm.    Patient was given opportunity to ask questions. Patient verbalized understanding of the plan and was able to repeat key elements of the plan. All questions were answered to their satisfaction.   I, Maximino Greenland, MD, have reviewed all documentation for this visit. The documentation on 07/24/22 for the exam, diagnosis, procedures, and orders are all accurate and complete.   IF YOU HAVE BEEN REFERRED TO A SPECIALIST, IT MAY TAKE 1-2 WEEKS TO SCHEDULE/PROCESS THE REFERRAL. IF YOU HAVE NOT HEARD FROM US/SPECIALIST IN TWO WEEKS, PLEASE GIVE [place] A CALL AT [phone].   THE PATIENT IS ENCOURAGED TO PRACTICE SOCIAL DISTANCING DUE TO THE COVID-19 PANDEMIC.

## 2022-08-02 ENCOUNTER — Telehealth: Payer: Self-pay

## 2022-08-02 NOTE — Telephone Encounter (Signed)
I called and left pt vm to call the office. Deanna Sparks has been trying to contact her for an appointment. [phone]. YL,RMA

## 2022-08-07 ENCOUNTER — Encounter (HOSPITAL_COMMUNITY): Payer: Self-pay

## 2022-08-07 ENCOUNTER — Ambulatory Visit (HOSPITAL_COMMUNITY)
Admission: EM | Admit: 2022-08-07 | Discharge: 2022-08-07 | Disposition: A | Discharge status: Home / Self Care | Payer: Medicaid Other | Attending: Nurse Practitioner | Admitting: Nurse Practitioner

## 2022-08-07 DIAGNOSIS — R070 Pain in throat: Secondary | ICD-10-CM

## 2022-08-07 DIAGNOSIS — Z1152 Encounter for screening for COVID-19: Secondary | ICD-10-CM | POA: Insufficient documentation

## 2022-08-07 DIAGNOSIS — R059 Cough, unspecified: Secondary | ICD-10-CM | POA: Insufficient documentation

## 2022-08-07 LAB — RESP PANEL BY RT-PCR (RSV, FLU A&B, COVID)  RVPGX2
Influenza A by PCR: NEGATIVE
Influenza B by PCR: NEGATIVE
Resp Syncytial Virus by PCR: NEGATIVE
SARS Coronavirus 2 by RT PCR: NEGATIVE

## 2022-08-07 NOTE — Discharge Instructions (Addendum)
Your COVID, Influenza and Strep Test are all pending You may have an Upper Respiratory Infection We encourage conservative treatment with symptom relief. We encourage you to use Tylenol alternating with Ibuprofen for your fever if not contraindicated. (Remember to use as directed do not exceed daily dosing recommendations) We also encourage salt water gargles for your sore throat. You should also consider throat lozenges and chloraseptic spray.  Your cough can be soothed with a cough suppressant. We have prescribed you a cough suppressant to be taken as  directed.

## 2022-08-07 NOTE — ED Triage Notes (Signed)
Pt c/o cough, sore throat, body aches, nasal congestion, and chills since Thursday and getting worse. Taking OTC meds with no relief.

## 2022-08-07 NOTE — ED Provider Notes (Signed)
[facility]    CSN: [id number] Arrival date & time: 08/07/22  1233      History   Chief Complaint Chief Complaint  Patient presents with   Cough    HPI Deanna Sparks is a 46 y.o. female.   HPI She is complaining of cough and sore throat. The cough is worse at night. She reports that initially the mucous was cleat. She is now having yellow mucous.  She has been taking allergy medication and bayer for the headache. She works in [employer]. She denies exposure. Denies fever or chest congestion, new loss of smell or taste, shortness of breath, chest pain, nausea, or diarrhea.   Past Medical History:  Diagnosis Date   Genital herpes    last outbreak 12/2011   Hypertension    Vaginal Pap smear, abnormal     Patient Active Problem List   Diagnosis Date Noted   ASCUS with positive high risk HPV cervical 08/24/2021   Well woman exam 08/24/2021   Anxiety 06/22/2021   OSA on CPAP 06/22/2021   Sleeps in sitting position due to orthopnea 03/14/2021   Loud snoring 03/14/2021   Insufficient sleep syndrome 03/14/2021   Nocturia more than twice per night 03/14/2021   Caregiver stress syndrome 03/14/2021   Other insomnia 03/14/2021   Sleep apnea with cognitive complaints 03/14/2021   Multinodular goiter (nontoxic) 09/24/2020   Essential hypertension 01/21/2019   Goiter 01/21/2019   Postnasal drip 01/21/2019   Plantar fasciitis 10/10/2018   Carpal tunnel syndrome 10/22/2015   Preterm labor 04/03/2012    Past Surgical History:  Procedure Laterality Date   CESAREAN SECTION     COLPOSCOPY  10/03/2021   LIPOMA EXCISION  2008   removed from forehead    OB History     Gravida  4   Para  3   Term  2   Preterm  1   AB  0   Living  3      SAB  0   IAB  0   Ectopic  0   Multiple  0   Live Births  1            Home Medications    Prior to Admission medications   Medication Sig Start Date End Date Taking? Authorizing Provider   cyclobenzaprine (FLEXERIL) 10 MG tablet Take 1 tablet (10 mg total) by mouth at bedtime. 07/04/22   White, Elita Boone, NP  lisinopril-hydrochlorothiazide (ZESTORETIC) 20-12.5 MG tablet Take 1 tablet by mouth daily. 04/24/22   Dorothyann Peng, MD  meloxicam (MOBIC) 7.5 MG tablet Take 1 tablet (7.5 mg total) by mouth daily. prn 06/19/22   Dorothyann Peng, MD    Family History Family History  Problem Relation Age of Onset   Hypertension Mother    Healthy Father    Thyroid disease Brother    Anesthesia problems Neg Hx    Other Neg Hx     Social History Social History   Tobacco Use   Smoking status: Never   Smokeless tobacco: Never  Vaping Use   Vaping Use: Never used  Substance Use Topics   Alcohol use: Yes    Comment: occasionally   Drug use: Never     Allergies   Codeine and Ultram [tramadol]   Review of Systems Review of Systems   Physical Exam Triage Vital Signs ED Triage Vitals  Enc Vitals Group     BP 08/07/22 1405 139/86     Pulse Rate 08/07/22  1405 71     Resp 08/07/22 1405 18     Temp 08/07/22 1405 98.8 F (37.1 C)     Temp Source 08/07/22 1405 Oral     SpO2 08/07/22 1405 99 %     Weight --      Height --      Head Circumference --      Peak Flow --      Pain Score 08/07/22 1406 5     Pain Loc --      Pain Edu? --      Excl. in GC? --    No data found.  Updated Vital Signs BP 139/86 (BP Location: Left Arm)   Pulse 71   Temp 98.8 F (37.1 C) (Oral)   Resp 18   LMP 08/07/2022 (Approximate)   SpO2 99%   Visual Acuity Right Eye Distance:   Left Eye Distance:   Bilateral Distance:    Right Eye Near:   Left Eye Near:    Bilateral Near:     Physical Exam HENT:     Head: Normocephalic and atraumatic.     Right Ear: Tympanic membrane normal.     Left Ear: Tympanic membrane normal.     Nose: Nose normal.     Mouth/Throat:     Mouth: Mucous membranes are moist.  Cardiovascular:     Rate and Rhythm: Normal rate and regular rhythm.      Pulses: Normal pulses.  Pulmonary:     Effort: Pulmonary effort is normal.     Breath sounds: Normal breath sounds.  Musculoskeletal:        General: Normal range of motion.     Cervical back: Normal range of motion.  Skin:    General: Skin is warm.     Capillary Refill: Capillary refill takes less than 2 seconds.  Neurological:     General: No focal deficit present.     Mental Status: She is alert and oriented to person, place, and time.  Psychiatric:        Mood and Affect: Mood normal.      UC Treatments / Results  Labs (all labs ordered are listed, but only abnormal results are displayed) Labs Reviewed  RESP PANEL BY RT-PCR (RSV, FLU A&B, COVID)  [id number]  POCT RAPID STREP A, ED / UC    EKG   Radiology No results found.  Procedures Procedures (including critical care time)  Medications Ordered in UC Medications - No data to display  Initial Impression / Assessment and Plan / UC Course  I have reviewed the triage vital signs and the nursing notes.  Pertinent labs & imaging results that were available during my care of the patient were reviewed by me and considered in my medical decision making (see chart for details).     Cough and sore throat Final Clinical Impressions(s) / UC Diagnoses   Final diagnoses:  Cough, unspecified type     Discharge Instructions      Your COVID, Influenza and Strep Test are all pending You may have an Upper Respiratory Infection We encourage conservative treatment with symptom relief. We encourage you to use Tylenol alternating with Ibuprofen for your fever if not contraindicated. (Remember to use as directed do not exceed daily dosing recommendations) We also encourage salt water gargles for your sore throat. You should also consider throat lozenges and chloraseptic spray.  Your cough can be soothed with a cough suppressant. We have prescribed you a cough suppressant to  be taken as  directed.       ED Prescriptions    None    PDMP not reviewed this encounter.   Dionisio David [place], [place] 08/07/22 1511

## 2022-08-08 ENCOUNTER — Telehealth (HOSPITAL_COMMUNITY): Payer: Self-pay | Admitting: Family Medicine

## 2022-08-08 DIAGNOSIS — G4733 Obstructive sleep apnea (adult) (pediatric): Secondary | ICD-10-CM | POA: Diagnosis not present

## 2022-08-08 LAB — POCT RAPID STREP A, ED / UC: Streptococcus, Group A Screen (Direct): NEGATIVE

## 2022-08-08 MED ORDER — PROMETHAZINE-DM 6.25-15 MG/5ML PO SYRP
5.0000 mL | ORAL_SOLUTION | Freq: Four times a day (QID) | ORAL | 0 refills | Status: DC | PRN
Start: 2022-08-08 — End: 2022-08-09

## 2022-08-08 NOTE — Telephone Encounter (Signed)
Rx cough requested.  Meds ordered this encounter  Medications   promethazine-dextromethorphan (PROMETHAZINE-DM) 6.25-15 MG/5ML syrup    Sig: Take 5 mLs by mouth 4 (four) times daily as needed for cough.    Dispense:  118 mL    Refill:  0

## 2022-08-09 ENCOUNTER — Encounter: Payer: Self-pay | Admitting: Internal Medicine

## 2022-08-09 ENCOUNTER — Ambulatory Visit (INDEPENDENT_AMBULATORY_CARE_PROVIDER_SITE_OTHER): Payer: Medicaid Other | Admitting: Internal Medicine

## 2022-08-09 VITALS — BP 132/86 | HR 98 | Temp 98.7°F | Ht 67.6 in | Wt 219.2 lb

## 2022-08-09 DIAGNOSIS — R29898 Other symptoms and signs involving the musculoskeletal system: Secondary | ICD-10-CM | POA: Diagnosis not present

## 2022-08-09 DIAGNOSIS — J209 Acute bronchitis, unspecified: Secondary | ICD-10-CM

## 2022-08-09 DIAGNOSIS — E6609 Other obesity due to excess calories: Secondary | ICD-10-CM | POA: Diagnosis not present

## 2022-08-09 DIAGNOSIS — Z0000 Encounter for general adult medical examination without abnormal findings: Secondary | ICD-10-CM | POA: Diagnosis not present

## 2022-08-09 DIAGNOSIS — I10 Essential (primary) hypertension: Secondary | ICD-10-CM

## 2022-08-09 DIAGNOSIS — R7309 Other abnormal glucose: Secondary | ICD-10-CM | POA: Diagnosis not present

## 2022-08-09 DIAGNOSIS — Z6833 Body mass index (BMI) 33.0-33.9, adult: Secondary | ICD-10-CM | POA: Diagnosis not present

## 2022-08-09 LAB — POCT URINALYSIS DIPSTICK
Bilirubin, UA: NEGATIVE
Glucose, UA: NEGATIVE
Ketones, UA: NEGATIVE
Leukocytes, UA: NEGATIVE
Nitrite, UA: NEGATIVE
Protein, UA: NEGATIVE
Spec Grav, UA: 1.025 (ref 1.010–1.025)
Urobilinogen, UA: 2 E.U./dL — AB
pH, UA: 5.5 (ref 5.0–8.0)

## 2022-08-09 MED ORDER — BENZONATATE 100 MG PO CAPS: 100.0000 mg | ORAL_CAPSULE | Freq: Three times a day (TID) | ORAL | 1 refills | Status: DC | PRN

## 2022-08-09 MED ORDER — TRIAMCINOLONE ACETONIDE 40 MG/ML IJ SUSP
60.0000 mg | Freq: Once | INTRAMUSCULAR | Status: AC
  Administered 2022-08-09: 60 mg via INTRAMUSCULAR

## 2022-08-09 NOTE — Patient Instructions (Signed)

## 2022-08-09 NOTE — Progress Notes (Signed)
Rich Brave [place],acting as a Education administrator for Maximino Greenland, MD.,have documented all relevant documentation on the behalf of Maximino Greenland, MD,as directed by  Maximino Greenland, MD while in the presence of Maximino Greenland, MD.   Subjective:     Patient ID: Deanna Sparks , female    DOB: [birth year] , 46 y.o.   MRN: [id number]   Chief Complaint  Patient presents with   Annual Exam    HPI  Pt here for HM. She has her pap smears performed by GYN.  She is now followed by [facility]. She reports compliance with meds.   She reports she has had a terrible cough since last Friday. She was seen by urgent care, respiratory panel was all negative. She was given cough syrup; however, her sx have persisted. She denies having both fever and chills.  Patient also reports she went to the urgent care on Monday due to her not feeling well.  Hypertension This is a chronic problem. The current episode started more than 1 year ago. The problem has been gradually improving since onset. The problem is controlled. Pertinent negatives include no anxiety or blurred vision. Past treatments include lifestyle changes. The current treatment provides moderate improvement.     Past Medical History:  Diagnosis Date   Genital herpes    last outbreak 12/2011   Hypertension    Vaginal Pap smear, abnormal      Family History  Problem Relation Age of Onset   Hypertension Mother    Healthy Father    Thyroid disease Brother    Anesthesia problems Neg Hx    Other Neg Hx      Current Outpatient Medications:    ACYCLOVIR PO, Take 1 tablet by mouth daily at 2 am., Disp: , Rfl:    benzonatate (TESSALON PERLES) 100 MG capsule, Take 1 capsule (100 mg total) by mouth 3 (three) times daily as needed for cough., Disp: 30 capsule, Rfl: 1   cholecalciferol (VITAMIN D3) 25 MCG (1000 UNIT) tablet, Take 1,000 Units by mouth daily., Disp: , Rfl:    lisinopril-hydrochlorothiazide (ZESTORETIC) 20-12.5 MG  tablet, Take 1 tablet by mouth daily., Disp: 90 tablet, Rfl: 2   amoxicillin-clavulanate (AUGMENTIN) 875-125 MG tablet, Take 1 tablet by mouth 2 (two) times daily., Disp: 14 tablet, Rfl: 0   fluconazole (DIFLUCAN) 150 MG tablet, Take 1 tablet (150 mg total) by mouth daily., Disp: 1 tablet, Rfl: 0   HYDROcodone bit-homatropine (HYDROMET) 5-1.5 MG/5ML syrup, Take 5 mLs by mouth every 6 (six) hours as needed., Disp: 120 mL, Rfl: 0   Allergies  Allergen Reactions   Codeine Itching   Ultram [Tramadol] Itching      The patient states she uses none for birth control. Last LMP was Patient's last menstrual period was 08/07/2022 (approximate).. Negative for Dysmenorrhea. Negative for: breast discharge, breast lump(s), breast pain and breast self exam. Associated symptoms include abnormal vaginal bleeding. Pertinent negatives include abnormal bleeding (hematology), anxiety, decreased libido, depression, difficulty falling sleep, dyspareunia, history of infertility, nocturia, sexual dysfunction, sleep disturbances, urinary incontinence, urinary urgency, vaginal discharge and vaginal itching. Diet regular.The patient states her exercise level is  intermittent.  . The patient's tobacco use is:  Social History   Tobacco Use  Smoking Status Never  Smokeless Tobacco Never  . She has been exposed to passive smoke. The patient's alcohol use is:  Social History   Substance and Sexual Activity  Alcohol Use Yes   Comment: occasionally  Review of Systems  Constitutional: Negative.   HENT:  Positive for congestion.   Eyes: Negative.  Negative for blurred vision.  Respiratory:  Positive for cough.   Cardiovascular: Negative.   Gastrointestinal: Negative.   Endocrine: Negative.   Genitourinary: Negative.   Musculoskeletal: Negative.   Skin: Negative.   Allergic/Immunologic: Negative.   Neurological:  Positive for weakness.       She c/o intermittent LUE weakness. Denies fall/trauma. Unable to  determine what triggers her sx. Sx are intermittent. Denies RUE weakness/b/l paresthesias.  Hematological: Negative.   Psychiatric/Behavioral: Negative.       Today's Vitals   08/09/22 1417 08/09/22 1547  BP: (!) 142/90 132/86  Pulse: 98   Temp: 98.7 F (37.1 C)   Weight: 219 lb 3.2 oz (99.4 kg)   Height: 5' 7.6" (1.717 m)   PainSc: 0-No pain    Body mass index is 33.72 kg/m.  Wt Readings from Last 3 Encounters:  08/09/22 219 lb 3.2 oz (99.4 kg)  07/24/22 221 lb 3.2 oz (100.3 kg)  04/24/22 229 lb 3.2 oz (104 kg)    BP Readings from Last 3 Encounters:  08/09/22 132/86  08/07/22 139/86  07/24/22 (!) 148/100    Objective:  Physical Exam Vitals and nursing note reviewed.  Constitutional:      Appearance: Normal appearance.  HENT:     Head: Normocephalic and atraumatic.     Right Ear: Tympanic membrane, ear canal and external ear normal.     Left Ear: Tympanic membrane, ear canal and external ear normal.     Nose:     Comments: Masked     Mouth/Throat:     Comments: Masked  Eyes:     Extraocular Movements: Extraocular movements intact.     Conjunctiva/sclera: Conjunctivae normal.     Pupils: Pupils are equal, round, and reactive to light.  Cardiovascular:     Rate and Rhythm: Normal rate and regular rhythm.     Pulses: Normal pulses.     Heart sounds: Normal heart sounds.  Pulmonary:     Effort: Pulmonary effort is normal.     Breath sounds: Rhonchi present.  Chest:  Breasts:    Tanner Score is 5.     Comments: She declined breast exam due to soreness,she is currently on her menses Abdominal:     General: Abdomen is flat. Bowel sounds are normal.     Palpations: Abdomen is soft.  Genitourinary:    Comments: deferred Musculoskeletal:        General: Normal range of motion.     Cervical back: Normal range of motion and neck supple.  Skin:    General: Skin is warm and dry.  Neurological:     General: No focal deficit present.     Mental Status: She is alert  and oriented to person, place, and time.     Motor: No weakness.     Gait: Gait normal.     Deep Tendon Reflexes: Reflexes normal.  Psychiatric:        Mood and Affect: Mood normal.        Behavior: Behavior normal.     Assessment And Plan:     1. Encounter for general adult medical examination w/o abnormal findings Comments: A full exam was performed. Importance of monthly self breast exams was discussed with the patient. - CMP14+EGFR - CBC - Hemoglobin A1c - Lipid panel  2. Essential hypertension Comments: Chronic, fair control. She coughed frequently during her exam, I  will not change meds today. EKG performed, NSR w/o acute changes .She will f/u in 4-6 months.  - POCT Urinalysis Dipstick (81002) - Microalbumin / Creatinine Urine Ratio - EKG 12-Lead  3. Acute bronchitis, unspecified organism Comments: She was given Kenalog, $RemoveBefore'60mg'[id number]$  IM x 1. I will also send rx tessalon perles to use prn. She will let me know if her sx persist.  - benzonatate (TESSALON PERLES) 100 MG capsule; Take 1 capsule (100 mg total) by mouth 3 (three) times daily as needed for cough.  Dispense: 30 capsule; Refill: 1 - triamcinolone acetonide (KENALOG-40) injection 60 mg  4. Other abnormal glucose Comments: Her a1 has been elevated in the past. I will recheck this today. She is encouraged to limit her intake of sweetened beverages, including diet drinks.   5. Weakness of left upper extremity Comments: She reports having intermittent sx. I will schedule her for CT brain.  - CT HEAD WO CONTRAST (5MM); Future  6. Class 1 obesity due to excess calories with serious comorbidity and body mass index (BMI) of 33.0 to 33.9 in adult Comments: She is encouraged to aim for at least 150 minutes of exercise per week, while initially striving for BMI<30 to decrease cardiac risk.   Patient was given opportunity to ask questions. Patient verbalized understanding of the plan and was able to repeat key elements of the plan.  All questions were answered to their satisfaction.   I, Maximino Greenland, MD, have reviewed all documentation for this visit. The documentation on 08/09/22 for the exam, diagnosis, procedures, and orders are all accurate and complete.   THE PATIENT IS ENCOURAGED TO PRACTICE SOCIAL DISTANCING DUE TO THE COVID-19 PANDEMIC.

## 2022-08-10 ENCOUNTER — Telehealth: Payer: Self-pay

## 2022-08-10 ENCOUNTER — Other Ambulatory Visit: Payer: Self-pay | Admitting: Internal Medicine

## 2022-08-10 LAB — LIPID PANEL
Chol/HDL Ratio: 3.3 ratio (ref 0.0–4.4)
Cholesterol, Total: 167 mg/dL (ref 100–199)
HDL: 50 mg/dL (ref >39)
LDL Chol Calc (NIH): 101 mg/dL — ABNORMAL HIGH (ref 0–99)
Triglycerides: 85 mg/dL (ref 0–149)
VLDL Cholesterol Cal: 16 mg/dL (ref 5–40)

## 2022-08-10 LAB — CMP14+EGFR
ALT: 21 IU/L (ref 0–32)
AST: 18 IU/L (ref 0–40)
Albumin/Globulin Ratio: 1.4 (ref 1.2–2.2)
Albumin: 4 g/dL (ref 3.9–4.9)
Alkaline Phosphatase: 62 IU/L (ref 44–121)
BUN/Creatinine Ratio: 11 (ref 9–23)
BUN: 10 mg/dL (ref 6–24)
Bilirubin Total: 0.3 mg/dL (ref 0.0–1.2)
CO2: 23 mmol/L (ref 20–29)
Calcium: 9.3 mg/dL (ref 8.7–10.2)
Chloride: 105 mmol/L (ref 96–106)
Creatinine, Ser: 0.89 mg/dL (ref 0.57–1.00)
Globulin, Total: 2.8 g/dL (ref 1.5–4.5)
Glucose: 88 mg/dL (ref 70–99)
Potassium: 3.9 mmol/L (ref 3.5–5.2)
Sodium: 140 mmol/L (ref 134–144)
Total Protein: 6.8 g/dL (ref 6.0–8.5)
eGFR: 80 mL/min/{1.73_m2} (ref >59)

## 2022-08-10 LAB — CBC
Hematocrit: 40.8 % (ref 34.0–46.6)
Hemoglobin: 13.5 g/dL (ref 11.1–15.9)
MCH: 29.7 pg (ref 26.6–33.0)
MCHC: 33.1 g/dL (ref 31.5–35.7)
MCV: 90 fL (ref 79–97)
Platelets: 319 10*3/uL (ref 150–450)
RBC: 4.54 x10E6/uL (ref 3.77–5.28)
RDW: 12.3 % (ref 11.7–15.4)
WBC: 10.8 10*3/uL (ref 3.4–10.8)

## 2022-08-10 LAB — MICROALBUMIN / CREATININE URINE RATIO
Creatinine, Urine: 175.6 mg/dL
Microalb/Creat Ratio: 5 mg/g creat (ref 0–29)
Microalbumin, Urine: 8.7 ug/mL

## 2022-08-10 LAB — HEMOGLOBIN A1C
Est. average glucose Bld gHb Est-mCnc: 120 mg/dL
Hgb A1c MFr Bld: 5.8 % — ABNORMAL HIGH (ref 4.8–5.6)

## 2022-08-10 MED ORDER — HYDROCODONE BIT-HOMATROP MBR 5-1.5 MG/5ML PO SOLN: 5.0000 mL | Freq: Four times a day (QID) | ORAL | 0 refills | Status: DC | PRN

## 2022-08-10 MED ORDER — AMOXICILLIN-POT CLAVULANATE 875-125 MG PO TABS: 1.0000 | ORAL_TABLET | Freq: Two times a day (BID) | ORAL | 0 refills | Status: DC

## 2022-08-10 MED ORDER — FLUCONAZOLE 150 MG PO TABS: 150.0000 mg | ORAL_TABLET | Freq: Every day | ORAL | 0 refills | Status: DC

## 2022-08-10 NOTE — Telephone Encounter (Signed)
Spoke with patient about how she was feeling if it was better. I spoke with her and she said she is coughing up stuff sometimes not always, no fever and she wants a stronger cough medicine than the one she has. She also said if you feel like she needs meds (she said yesterday you weren't sure)  she will take it but if you send her antibiotics she wants meds for a yeast infection as well. She states the steroid shot she received did nothing and her mucus is green. She states she wanted hydrocodone, but if she couldn't receive it she would just double up on her benzonatate pills and if it doesn't get better over the weekend she will go to urgent care again.   Patient previously left a message today states she was a little better but when called didn't express this.

## 2022-08-21 NOTE — Patient Instructions (Incomplete)

## 2022-08-21 NOTE — Progress Notes (Deleted)
PATIENT: Deanna Sparks DOB: September 11, 1976  REASON FOR VISIT: follow up HISTORY FROM: patient  No chief complaint on file.    HISTORY OF PRESENT ILLNESS:  08/21/22 ALL:  Deanna Sparks is a 46 y.o. female here today for follow up for OSA on CPAP.  She was seen by Dr Vickey Huger in 08/2021 for initial CPAP visit and had 70% daily compliance with even lower 4 hour compliance. She was encouraged to focus on using CPAP nightly for at least 4 hours.     HISTORY: (copied from Dr Dohmeier's previous note)  [name] underwent a home sleep test at [facility] on 04-20-21.  She had endorsed the Epworth sleepiness score at 12 points fatigue severity at 38 points and she had concerns about loud snoring, excessive daytime sleepiness, fragmented unrefreshing sleep.  Family members have worried about her snoring and also felt bothered by it.  She felt that she was unable to focus and concentrate in daytime.  She also would not feel that she got into deep sleep and she had more often nocturia and she desired to.  Her home sleep test showed 6 hours 19 minutes of sleep was 26.7% REM sleep, an AHI mild apnea with an AHI of 5.8/h.  No hypoxia and normal pulse rate variability what was noted was bother loud snoring.  The patient tried CPAP and she has had success.  She also noted that her concentration and focusing ability has improved, her Epworth sleepiness score has now been endorsed at only 7 points down from 12, she no longer feels excessively fatigued.  Some nights she can sleep 7 hours straight without a bathroom break and she has noticed that her bed partner has not left the bedroom as he used to.  The last 30 days of her CPAP compliance report shows 70% of days the machine has been used but 10 of those days only less than 4 hours.  So it is the the total time on CPAP that would deem her not fully compliant.  Her device is an iron breeze 28 with a pressure range between 5 and 12 cmH2O no EPR was set,  the residual AHI has been 0.2/h which is a great success.  95th percentile pressure is 9.1 cmH2O and she has very few air leaks.  She sometimes still wakes up with a dry mouth and noticed that her mouth drops open so she is entertaining using a chinstrap.  I am more worried about the time on CPAP on average that needs to be over 4 hours each night.  So we will be working on this tonight.   EMERSEN Sparks is a 3 - year old  African-American female patient and was seen hereupon referral on 08/24/2021 from her PCP. She reports having frequent headaches, often with some residual nausea, sometimes present when waking from sleep. She feels poorly rested and not refreshed in AM. Her BP was first diagnosed 8 years ago, and  has been elevated again  for several month.   Chief concern according to patient : "not getting good sleep , sleep is fragmanted, lot's of bathroom breaks,  I work  From 7 AM- 3 PM , rising at 5 AM. My sleep and snoring changed over the last 8 years - since pregnancy".    I have the pleasure of seeing Deanna Sparks today, a right-handed [place] American female with a possible sleep disorder.  She has a medical history of Obesity and Hypertension, Goiter  and Migraines. Her head hurst and feels heavy, her eyes feel sore.   Sleep relevant medical history: Nocturia 3-5 times , no Tonsillectomy.   Family medical /sleep history: No other family member on CPAP with OSA, insomnia, sleep walkers.    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 Patient is working as a Clinical biochemist- and lives in a household with her son, age 67 . Family status is single. no pets.  Tobacco use: never    ETOH use; rare,  Caffeine intake in form of Soda( 3a week ) . Regular exercise in form of walking.     Sleep habits are as follows: The patient's dinner time is between 6-7 PM. The patient goes to bed at 12 PM and continues to sleep for 1-2 hours, wakes for many  bathroom breaks. She reports difficulties to fall asleep and stay asleep. Nocturia. TV helps her to doze off.   The preferred sleep position is laterally-, with the support of 6 pillows.  Dreams are reportedly frequent . 5 AM is the usual rise time. The patient wakes up spontaneously. She reports not feeling refreshed or restored in AM, with symptoms such as dry mouth , morning headaches , and residual fatigue.  Naps are taken infrequently, there is not time but there a desire- lasting from 20 minutes and are more refreshing than nocturnal sleep.    REVIEW OF SYSTEMS: Out of a complete 14 system review of symptoms, the patient complains only of the following symptoms, and all other reviewed systems are negative.  ESS:  ALLERGIES: Allergies  Allergen Reactions   Codeine Itching   Ultram [Tramadol] Itching    HOME MEDICATIONS: Outpatient Medications Prior to Visit  Medication Sig Dispense Refill   ACYCLOVIR PO Take 1 tablet by mouth daily at 2 am.     amoxicillin-clavulanate (AUGMENTIN) 875-125 MG tablet Take 1 tablet by mouth 2 (two) times daily. 14 tablet 0   benzonatate (TESSALON PERLES) 100 MG capsule Take 1 capsule (100 mg total) by mouth 3 (three) times daily as needed for cough. 30 capsule 1   cholecalciferol (VITAMIN D3) 25 MCG (1000 UNIT) tablet Take 1,000 Units by mouth daily.     fluconazole (DIFLUCAN) 150 MG tablet Take 1 tablet (150 mg total) by mouth daily. 1 tablet 0   HYDROcodone bit-homatropine (HYDROMET) 5-1.5 MG/5ML syrup Take 5 mLs by mouth every 6 (six) hours as needed. 120 mL 0   lisinopril-hydrochlorothiazide (ZESTORETIC) 20-12.5 MG tablet Take 1 tablet by mouth daily. 90 tablet 2   No facility-administered medications prior to visit.    PAST MEDICAL HISTORY: Past Medical History:  Diagnosis Date   Genital herpes    last outbreak 12/2011   Hypertension    Vaginal Pap smear, abnormal     PAST SURGICAL HISTORY: Past Surgical History:  Procedure Laterality  Date   CESAREAN SECTION     COLPOSCOPY  10/03/2021   LIPOMA EXCISION  2008   removed from forehead    FAMILY HISTORY: Family History  Problem Relation Age of Onset   Hypertension Mother    Healthy Father    Thyroid disease Brother    Anesthesia problems Neg Hx    Other Neg Hx     SOCIAL HISTORY: Social History   Socioeconomic History   Marital status: Single    Spouse name: Not on file   Number of children: 3   Years of education: Not on file  Highest education level: Not on file  Occupational History   Occupation: CMA  Tobacco Use   Smoking status: Never   Smokeless tobacco: Never  Vaping Use   Vaping Use: Never used  Substance and Sexual Activity   Alcohol use: Yes    Comment: occasionally   Drug use: Never   Sexual activity: Yes    Birth control/protection: None  Other Topics Concern   Not on file  Social History Narrative   Not on file   Social Determinants of Health   Financial Resource Strain: Not on file  Food Insecurity: No Food Insecurity (08/24/2021)   Hunger Vital Sign    Worried About Running Out of Food in the Last Year: Never true    Ran Out of Food in the Last Year: Never true  Transportation Needs: No Transportation Needs (08/24/2021)   PRAPARE - Administrator, Civil Service (Medical): No    Lack of Transportation (Non-Medical): No  Physical Activity: Not on file  Stress: Not on file  Social Connections: Not on file  Intimate Partner Violence: Not on file     PHYSICAL EXAM  There were no vitals filed for this visit. There is no height or weight on file to calculate BMI.  Generalized: Well developed, in no acute distress  Cardiology: normal rate and rhythm, no murmur noted Respiratory: clear to auscultation bilaterally  Neurological examination  Mentation: Alert oriented to time, place, history taking. Follows all commands speech and language fluent Cranial nerve II-XII: Pupils were equal round reactive to light.  Extraocular movements were full, visual field were full  Motor: The motor testing reveals 5 over 5 strength of all 4 extremities. Good symmetric motor tone is noted throughout.  Gait and station: Gait is normal.    DIAGNOSTIC DATA (LABS, IMAGING, TESTING) - I reviewed patient records, labs, notes, testing and imaging myself where available.      No data to display           Lab Results  Component Value Date   WBC 10.8 08/09/2022   HGB 13.5 08/09/2022   HCT 40.8 08/09/2022   MCV 90 08/09/2022   PLT 319 08/09/2022      Component Value Date/Time   NA 140 08/09/2022 1541   K 3.9 08/09/2022 1541   CL 105 08/09/2022 1541   CO2 23 08/09/2022 1541   GLUCOSE 88 08/09/2022 1541   GLUCOSE 71 12/30/2015 1444   BUN 10 08/09/2022 1541   CREATININE 0.89 08/09/2022 1541   CREATININE 0.75 12/30/2015 1444   CALCIUM 9.3 08/09/2022 1541   PROT 6.8 08/09/2022 1541   ALBUMIN 4.0 08/09/2022 1541   AST 18 08/09/2022 1541   ALT 21 08/09/2022 1541   ALKPHOS 62 08/09/2022 1541   BILITOT 0.3 08/09/2022 1541   GFRNONAA 85 07/26/2020 1332   GFRAA 98 07/26/2020 1332   Lab Results  Component Value Date   CHOL 167 08/09/2022   HDL 50 08/09/2022   LDLCALC 101 (H) 08/09/2022   TRIG 85 08/09/2022   CHOLHDL 3.3 08/09/2022   Lab Results  Component Value Date   HGBA1C 5.8 (H) 08/09/2022   No results found for: "VITAMINB12" Lab Results  Component Value Date   TSH 0.795 06/22/2021     ASSESSMENT AND PLAN 46 y.o. year old female  has a past medical history of Genital herpes, Hypertension, and Vaginal Pap smear, abnormal. here with   No diagnosis found.    TAMME MOZINGO is doing well  on CPAP therapy. Compliance report reveals ***. *** was encouraged to continue using CPAP nightly and for greater than 4 hours each night. We will update supply orders as indicated. Risks of untreated sleep apnea review and education materials provided. Healthy lifestyle habits encouraged. *** will follow up  in ***, sooner if needed. *** verbalizes understanding and agreement with this plan.    No orders of the defined types were placed in this encounter.    No orders of the defined types were placed in this encounter.     Debbora Presto, FNP-C 08/21/2022, 10:37 AM [facility] [address], [place], [place] [address] [phone]

## 2022-08-24 ENCOUNTER — Encounter: Payer: Self-pay | Admitting: Obstetrics and Gynecology

## 2022-08-24 ENCOUNTER — Ambulatory Visit (INDEPENDENT_AMBULATORY_CARE_PROVIDER_SITE_OTHER): Payer: Medicaid Other | Admitting: Obstetrics and Gynecology

## 2022-08-24 ENCOUNTER — Other Ambulatory Visit (HOSPITAL_COMMUNITY)
Admission: RE | Admit: 2022-08-24 | Discharge: 2022-08-24 | Disposition: A | Discharge status: Home / Self Care | Payer: Medicaid Other | Source: Ambulatory Visit | Attending: Obstetrics and Gynecology | Admitting: Obstetrics and Gynecology

## 2022-08-24 ENCOUNTER — Ambulatory Visit: Payer: 59 | Admitting: Family Medicine

## 2022-08-24 VITALS — BP 148/91 | HR 79 | Ht 68.5 in | Wt 218.6 lb

## 2022-08-24 DIAGNOSIS — R87810 Cervical high risk human papillomavirus (HPV) DNA test positive: Secondary | ICD-10-CM | POA: Insufficient documentation

## 2022-08-24 DIAGNOSIS — R87610 Atypical squamous cells of undetermined significance on cytologic smear of cervix (ASC-US): Secondary | ICD-10-CM

## 2022-08-24 DIAGNOSIS — Z30011 Encounter for initial prescription of contraceptive pills: Secondary | ICD-10-CM

## 2022-08-24 DIAGNOSIS — Z113 Encounter for screening for infections with a predominantly sexual mode of transmission: Secondary | ICD-10-CM | POA: Insufficient documentation

## 2022-08-24 DIAGNOSIS — G4733 Obstructive sleep apnea (adult) (pediatric): Secondary | ICD-10-CM

## 2022-08-24 MED ORDER — SLYND 4 MG PO TABS: 4.0000 mg | ORAL_TABLET | Freq: Every day | ORAL | 11 refills | Status: DC

## 2022-08-24 NOTE — Progress Notes (Signed)
    GYNECOLOGY VISIT  Patient name: Deanna Sparks MRN [id number]  Date of birth: 04/19/1976 Chief Complaint:   Gynecologic Exam  History:  Deanna Sparks is a 46 y.o. [phone] being seen today for pap. Prior pap was positive for HPV and colpo LSIL.    Also wondering if she should be on birth control, currently sexually active without any contraception on board. Does not want anything implanted, injected or procedural. Has regular menses.   Past Medical History:  Diagnosis Date   Genital herpes    last outbreak 12/2011   Hypertension    Vaginal Pap smear, abnormal     Past Surgical History:  Procedure Laterality Date   CESAREAN SECTION     COLPOSCOPY  10/03/2021   LIPOMA EXCISION  2008   removed from forehead    The following portions of the patient's history were reviewed and updated as appropriate: allergies, current medications, past family history, past medical history, past social history, past surgical history and problem list.     Review of Systems:  Pertinent items are noted in HPI. Comprehensive review of systems was otherwise negative.   Objective:  Physical Exam Wt 218 lb 9.6 oz (99.2 kg)   LMP 08/07/2022 (Exact Date) Comment: lasts 3-4 days  BMI 33.63 kg/m    Physical Exam Vitals and nursing note reviewed. Exam conducted with a chaperone present.  Constitutional:      Appearance: Normal appearance.  HENT:     Head: Normocephalic and atraumatic.  Cardiovascular:     Rate and Rhythm: Normal rate and regular rhythm.  Pulmonary:     Effort: Pulmonary effort is normal.     Breath sounds: Normal breath sounds.  Genitourinary:    General: Normal vulva.     Exam position: Lithotomy position.     Vagina: Normal.     Cervix: Normal.  Skin:    General: Skin is warm and dry.  Neurological:     General: No focal deficit present.     Mental Status: She is alert.  Psychiatric:        Mood and Affect: Mood normal.        Behavior: Behavior normal.         Thought Content: Thought content normal.        Judgment: Judgment normal.      Labs and Imaging No results found.     Assessment & Plan:   1. 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 Routine screening for STI (sexually transmitted infection) Offered and accepted testing today - Cervicovaginal ancillary only - RPR+HBsAg+HCVAb+...  3. Encounter for initial prescription of contraceptive pills Reviewed all forms of birth control options available including abstinence; over the counter/barrier methods; hormonal contraceptive medication including pill, patch, ring, injection,contraceptive implant; hormonal and nonhormonal IUDs; permanent sterilization options including vasectomy and the various tubal sterilization modalities. Risks and benefits reviewed.  Questions were answered.  Recommend POP due to co-morbidities and personal preferences.    - Drospirenone (SLYND) 4 MG TABS; Take 4 mg by mouth daily.  Dispense: 28 tablet; Refill: 11  Routine preventative health maintenance measures emphasized.  Darliss Cheney, MD [facility] [place]

## 2022-08-24 NOTE — Patient Instructions (Signed)
[email or web address]

## 2022-08-25 LAB — CERVICOVAGINAL ANCILLARY ONLY
Bacterial Vaginitis (gardnerella): NEGATIVE
Candida Glabrata: NEGATIVE
Candida Vaginitis: NEGATIVE
Chlamydia: NEGATIVE
Comment: NEGATIVE
Comment: NEGATIVE
Comment: NEGATIVE
Comment: NEGATIVE
Comment: NEGATIVE
Comment: NORMAL
Neisseria Gonorrhea: NEGATIVE
Trichomonas: NEGATIVE

## 2022-08-25 LAB — RPR+HBSAG+HCVAB+...
HIV Screen 4th Generation wRfx: NONREACTIVE
Hep C Virus Ab: NONREACTIVE
Hepatitis B Surface Ag: NEGATIVE
RPR Ser Ql: NONREACTIVE

## 2022-09-04 LAB — CYTOLOGY - PAP
Comment: NEGATIVE
Diagnosis: NEGATIVE
High risk HPV: NEGATIVE

## 2022-09-08 DIAGNOSIS — G4733 Obstructive sleep apnea (adult) (pediatric): Secondary | ICD-10-CM | POA: Diagnosis not present

## 2022-09-17 ENCOUNTER — Encounter (HOSPITAL_COMMUNITY): Payer: Self-pay

## 2022-09-17 ENCOUNTER — Ambulatory Visit (HOSPITAL_COMMUNITY)
Admission: RE | Admit: 2022-09-17 | Discharge: 2022-09-17 | Disposition: A | Discharge status: Home / Self Care | Payer: Medicaid Other | Source: Ambulatory Visit | Attending: Internal Medicine | Admitting: Internal Medicine

## 2022-09-17 VITALS — BP 136/100 | HR 84 | Temp 99.0°F | Resp 16

## 2022-09-17 DIAGNOSIS — Z1152 Encounter for screening for COVID-19: Secondary | ICD-10-CM | POA: Diagnosis not present

## 2022-09-17 DIAGNOSIS — J069 Acute upper respiratory infection, unspecified: Secondary | ICD-10-CM | POA: Diagnosis not present

## 2022-09-17 LAB — RESP PANEL BY RT-PCR (RSV, FLU A&B, COVID)  RVPGX2
Influenza A by PCR: NEGATIVE
Influenza B by PCR: NEGATIVE
Resp Syncytial Virus by PCR: POSITIVE — AB
SARS Coronavirus 2 by RT PCR: NEGATIVE

## 2022-09-17 LAB — POCT RAPID STREP A, ED / UC: Streptococcus, Group A Screen (Direct): NEGATIVE

## 2022-09-17 MED ORDER — FLUTICASONE PROPIONATE 50 MCG/ACT NA SUSP: 1.0000 | Freq: Every day | NASAL | 0 refills | Status: DC

## 2022-09-17 MED ORDER — CETIRIZINE HCL 10 MG PO TABS: 10.0000 mg | ORAL_TABLET | Freq: Every day | ORAL | 0 refills | Status: DC

## 2022-09-17 NOTE — ED Provider Notes (Signed)
[facility]    CSN: [id number] Arrival date & time: 09/17/22  1058      History   Chief Complaint Chief Complaint  Patient presents with   Sore Throat    Head aches body aches congested head and ears - Entered by patient    HPI Deanna Sparks is a 46 y.o. female with history of HTN presents to UC today with complaint of headache, nasal congestion, sore throat and body aches.  She reports this started 3 days ago.  The headache is located in her forehead.  She describes the pain as pressure.  She is not blowing much mucus out of her nose.  She did have some difficulty swallowing.  She denies runny nose, ear pain, cough, shortness of breath, chest pain, nausea, vomiting or diarrhea.  She denies fever or chills.  She has tried Mucinex, TheraFlu and Robitussin with minimal relief of symptoms.  She has had sick contacts with similar symptoms.  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/14/2021   Nocturia more than twice per night 03/14/2021   Caregiver stress syndrome 03/14/2021   Other insomnia 03/14/2021   Sleep apnea with cognitive complaints 03/14/2021   Multinodular goiter (nontoxic) 09/24/2020   Essential hypertension 01/21/2019   Goiter 01/21/2019   Postnasal drip 01/21/2019   Plantar fasciitis 10/10/2018   Carpal tunnel syndrome 10/22/2015   Preterm labor 04/03/2012    Past Surgical History:  Procedure Laterality Date   CESAREAN SECTION     COLPOSCOPY  10/03/2021   LIPOMA EXCISION  2008   removed from forehead    OB History     Gravida  3   Para  3    Term  2   Preterm  1   AB  0   Living  3      SAB  0   IAB  0   Ectopic  0   Multiple  0   Live Births  1            Home Medications    Prior to Admission medications   Medication Sig Start Date End Date Taking? Authorizing Provider  cetirizine (ZYRTEC ALLERGY) 10 MG tablet Take 1 tablet (10 mg total) by mouth daily. 09/17/22  Yes Audrionna Lampton, Salvadore Oxford, NP  fluticasone (FLONASE) 50 MCG/ACT nasal spray Place 1 spray into both nostrils daily. 09/17/22  Yes Kairo Laubacher, Salvadore Oxford, NP  lisinopril-hydrochlorothiazide (ZESTORETIC) 20-12.5 MG tablet Take 1 tablet by mouth daily. 04/24/22  Yes Dorothyann Peng, MD  ACYCLOVIR PO Take 1 tablet by mouth daily at 2 am.    [provider]  benzonatate (TESSALON PERLES) 100 MG capsule Take 1 capsule (100 mg total) by mouth 3 (three) times daily as needed for cough. Patient not taking: Reported on 08/24/2022 08/09/22 08/09/23  Dorothyann Peng, MD  cholecalciferol (VITAMIN D3) 25 MCG (1000 UNIT) tablet Take 1,000 Units by mouth daily.    [provider]  Drospirenone (SLYND) 4 MG TABS Take 4 mg by mouth daily. 08/24/22  Lorriane Shire, MD  HYDROcodone bit-homatropine (HYDROMET) 5-1.5 MG/5ML syrup Take 5 mLs by mouth every 6 (six) hours as needed. Patient not taking: Reported on 08/24/2022 08/10/22   Dorothyann Peng, MD    Family History Family History  Problem Relation Age of Onset   Hypertension Mother    Healthy Father    Thyroid disease Brother    Anesthesia problems Neg Hx    Other Neg Hx     Social History Social History   Tobacco Use   Smoking status: Never   Smokeless tobacco: Never  Vaping Use   Vaping Use: Never used  Substance Use Topics   Alcohol use: Yes    Comment: occasionally   Drug use: Never     Allergies   Codeine and Ultram [tramadol]   Review of Systems Review of Systems  Constitutional:  Positive for fatigue. Negative for chills and fever.  HENT:  Positive for congestion, sore throat  and trouble swallowing. Negative for ear pain and rhinorrhea.   Eyes:  Negative for pain and redness.  Respiratory:  Negative for cough, chest tightness and shortness of breath.   Cardiovascular:  Negative for chest pain.  Gastrointestinal:  Negative for diarrhea, nausea and vomiting.  Skin:  Negative for rash.     Physical Exam Triage Vital Signs ED Triage Vitals  Enc Vitals Group     BP 09/17/22 1136 (!) 136/100     Pulse Rate 09/17/22 1136 84     Resp 09/17/22 1136 16     Temp 09/17/22 1136 99 F (37.2 C)     Temp Source 09/17/22 1136 Oral     SpO2 09/17/22 1136 99 %     Weight --      Height --      Head Circumference --      Peak Flow --      Pain Score 09/17/22 1135 9     Pain Loc --      Pain Edu? --      Excl. in GC? --    No data found.  Updated Vital Signs BP (!) 136/100 (BP Location: Right Arm)   Pulse 84   Temp 99 F (37.2 C) (Oral)   Resp 16   LMP 08/07/2022 (Exact Date) Comment: lasts 3-4 days  SpO2 99%   Visual Acuity Right Eye Distance:   Left Eye Distance:   Bilateral Distance:    Right Eye Near:   Left Eye Near:    Bilateral Near:     Physical Exam HENT:     Head: Normocephalic.     Right Ear: Tympanic membrane and ear canal normal.     Left Ear: Tympanic membrane and ear canal normal.     Nose: Congestion present.     Mouth/Throat:     Mouth: Mucous membranes are moist.     Pharynx: No pharyngeal swelling, oropharyngeal exudate, posterior oropharyngeal erythema or uvula swelling.  Eyes:     Conjunctiva/sclera: Conjunctivae normal.  Cardiovascular:     Rate and Rhythm: Normal rate and regular rhythm.  Pulmonary:     Effort: Pulmonary effort is normal.     Breath sounds: No wheezing, rhonchi or rales.  Skin:    General: Skin is warm.     Findings: No rash.  Neurological:     Mental Status: She is alert.      UC Treatments / Results  Labs Labs Reviewed  RESP PANEL BY RT-PCR (RSV, FLU A&B, COVID)  [id number]  POCT  RAPID STREP A,  ED / UC    Medications Ordered in UC Medications - No data to display  Initial Impression / Assessment and Plan / UC Course  I have reviewed the triage vital signs and the nursing notes.  Pertinent labs & imaging results that were available during my care of the patient were reviewed by me and considered in my medical decision making (see chart for details).     46 year old female with headache, nasal congestion, sore throat x3 days.  DDx include viral URI, viral pharyngitis, bacterial pharyngitis, COVID, influenza, RSV.  Strep test is negative.  COVID/flu/RSV swab is pending and she is aware that she will be called with any positive result.  Exam most consistent with viral URI.  Will treat with Flonase 1 spray each nostril daily x7 days and Zyrtec 10 mg daily x7 days.  She will follow-up with her PCP if symptoms persist or worsen.  Final Clinical Impressions(s) / UC Diagnoses   Final diagnoses:  Viral URI     Discharge Instructions      You were seen today for upper respiratory symptoms.  Your strep test was negative.  Your COVID/flu/RSV swab is pending.  We will call you only if there is a positive result.  If this turns out positive, we will treat you with antiviral therapy.  Otherwise we recommend symptomatic care with Flonase and Zyrtec which I have sent to your pharmacy.  Please use as directed.  You may also take Ibuprofen OTC as needed for body aches and sore throat.  Please follow-up with your PCP if symptoms persist or worsen.     ED Prescriptions     Medication Sig Dispense Auth. Provider   fluticasone (FLONASE) 50 MCG/ACT nasal spray Place 1 spray into both nostrils daily. 16 g Lorre Munroe, NP   cetirizine (ZYRTEC ALLERGY) 10 MG tablet Take 1 tablet (10 mg total) by mouth daily. 7 tablet Lorre Munroe, NP      PDMP not reviewed this encounter.   Lorre Munroe, NP 09/17/22 1222

## 2022-09-17 NOTE — Discharge Instructions (Signed)
You were seen today for upper respiratory symptoms.  Your strep test was negative.  Your COVID/flu/RSV swab is pending.  We will call you only if there is a positive result.  If this turns out positive, we will treat you with antiviral therapy.  Otherwise we recommend symptomatic care with Flonase and Zyrtec which I have sent to your pharmacy.  Please use as directed.  You may also take Ibuprofen OTC as needed for body aches and sore throat.  Please follow-up with your PCP if symptoms persist or worsen.

## 2022-09-17 NOTE — ED Triage Notes (Signed)
Chief Complaint: Sore throat, body aches, congestion, fatigue  Onset: 3 days  OTC medications tried: Yes- Mucinex, Theraflu, robitussin    with mild relief  Sick exposure: Yes- son  New foods or medications: No  Recent Travel: No

## 2022-09-29 ENCOUNTER — Other Ambulatory Visit: Payer: Medicaid Other

## 2022-10-08 DIAGNOSIS — G4733 Obstructive sleep apnea (adult) (pediatric): Secondary | ICD-10-CM | POA: Diagnosis not present

## 2022-10-11 ENCOUNTER — Ambulatory Visit (INDEPENDENT_AMBULATORY_CARE_PROVIDER_SITE_OTHER): Payer: Medicaid Other | Admitting: Internal Medicine

## 2022-10-11 ENCOUNTER — Encounter: Payer: Self-pay | Admitting: Internal Medicine

## 2022-10-11 VITALS — BP 120/72 | HR 80 | Temp 97.6°F | Ht 68.5 in | Wt 221.2 lb

## 2022-10-11 DIAGNOSIS — N921 Excessive and frequent menstruation with irregular cycle: Secondary | ICD-10-CM

## 2022-10-11 DIAGNOSIS — E6609 Other obesity due to excess calories: Secondary | ICD-10-CM | POA: Diagnosis not present

## 2022-10-11 DIAGNOSIS — I10 Essential (primary) hypertension: Secondary | ICD-10-CM | POA: Diagnosis not present

## 2022-10-11 DIAGNOSIS — Z6833 Body mass index (BMI) 33.0-33.9, adult: Secondary | ICD-10-CM

## 2022-10-11 DIAGNOSIS — R29898 Other symptoms and signs involving the musculoskeletal system: Secondary | ICD-10-CM | POA: Diagnosis not present

## 2022-10-11 DIAGNOSIS — Z79899 Other long term (current) drug therapy: Secondary | ICD-10-CM | POA: Diagnosis not present

## 2022-10-11 NOTE — Progress Notes (Signed)
Jeri Cos [place]leton,acting as a Neurosurgeon for Deanna Aliment, MD.,have documented all relevant documentation on the behalf of Deanna Aliment, MD,as directed by  Deanna Aliment, MD while in the presence of Deanna Aliment, MD.    Subjective:     Patient ID: Deanna Sparks , female    DOB: September 06, 1976 , 46 y.o.   MRN: [id number]   Chief Complaint  Patient presents with   Abdominal Cramping    HPI  Patient presents today for cramping and bleeding for 16 days. She states her cycle started on 12/4.  She states she typically has heavy bleeding x 2 days, then bleeds for a full five days.  Her cycle was light this time, and then she developed headaches. She is currently sexually active, but she states she could not be pregnant. Does not use contraception.  She would like for her thyroid to be checked because she read thyroid disease can cause heavy bleeding. She has not had similar issues in the past.   Abdominal Cramping This is a new problem. The current episode started 1 to 4 weeks ago. The problem occurs constantly. Pain location: lower abdomen. The quality of the pain is cramping.     Past Medical History:  Diagnosis Date   Genital herpes    last outbreak 12/2011   Hypertension    Vaginal Pap smear, abnormal      Family History  Problem Relation Age of Onset   Hypertension Mother    Healthy Father    Thyroid disease Brother    Anesthesia problems Neg Hx    Other Neg Hx      Current Outpatient Medications:    ACYCLOVIR PO, Take 1 tablet by mouth daily at 2 am., Disp: , Rfl:    cetirizine (ZYRTEC ALLERGY) 10 MG tablet, Take 1 tablet (10 mg total) by mouth daily., Disp: 7 tablet, Rfl: 0   cholecalciferol (VITAMIN D3) 25 MCG (1000 UNIT) tablet, Take 1,000 Units by mouth daily., Disp: , Rfl:    fluticasone (FLONASE) 50 MCG/ACT nasal spray, Place 1 spray into both nostrils daily., Disp: 16 g, Rfl: 0   lisinopril-hydrochlorothiazide (ZESTORETIC) 20-12.5 MG tablet, Take 1 tablet  by mouth daily., Disp: 90 tablet, Rfl: 2   Allergies  Allergen Reactions   Codeine Itching   Ultram [Tramadol] Itching     Review of Systems  Constitutional: Negative.   Respiratory: Negative.    Cardiovascular: Negative.   Gastrointestinal: Negative.   Genitourinary:  Positive for menstrual problem.  Neurological: Negative.   Psychiatric/Behavioral: Negative.       Today's Vitals   10/11/22 0958  BP: 120/72  Pulse: 80  Temp: 97.6 F (36.4 C)  Weight: 221 lb 3.2 oz (100.3 kg)  Height: 5' 8.5" (1.74 m)  PainSc: 4    Body mass index is 33.14 kg/m.  Wt Readings from Last 3 Encounters:  10/11/22 221 lb 3.2 oz (100.3 kg)  08/24/22 218 lb 9.6 oz (99.2 kg)  08/09/22 219 lb 3.2 oz (99.4 kg)     Objective:  Physical Exam Vitals and nursing note reviewed.  Constitutional:      Appearance: Normal appearance.  HENT:     Head: Normocephalic and atraumatic.     Nose:     Comments: Masked     Mouth/Throat:     Comments: Masked  Eyes:     Extraocular Movements: Extraocular movements intact.  Cardiovascular:     Rate and Rhythm: Normal rate and regular rhythm.  Heart sounds: Normal heart sounds.  Pulmonary:     Effort: Pulmonary effort is normal.     Breath sounds: Normal breath sounds.  Abdominal:     General: Bowel sounds are normal.     Palpations: Abdomen is soft.     Tenderness: There is no abdominal tenderness. There is no guarding or rebound.  Musculoskeletal:     Cervical back: Normal range of motion.  Skin:    General: Skin is warm.  Neurological:     General: No focal deficit present.     Mental Status: She is alert.  Psychiatric:        Mood and Affect: Mood normal.        Behavior: Behavior normal.      Assessment And Plan:     1. Menorrhagia with irregular cycle Comments: I will check labs as below. Recent GYN notes reviewed, these sx not discussed during this visit. I will also schedule her for pelvic/TV ultrasound. - TSH - Pregnancy Test,  Serum, Qual - CBC no Diff - US Pelvic Complete With Transvaginal; Future  2. Essential hypertension Comments: Chronic, well controlled. No med changes.  3. Decreased grip strength of left hand Comments: I planned to schedule her for NCS; however, she prefers to discuss with her hand specialist.  4. Class 1 obesity due to excess calories with serious comorbidity and body mass index (BMI) of 33.0 to 33.9 in adult Comments: She is encouraged to aim for at least 150 minutes of exercise per week.   Patient was given opportunity to ask questions. Patient verbalized understanding of the plan and was able to repeat key elements of the plan. All questions were answered to their satisfaction.   I, Deanna Aliment, MD, have reviewed all documentation for this visit. The documentation on 10/11/22 for the exam, diagnosis, procedures, and orders are all accurate and complete.   IF YOU HAVE BEEN REFERRED TO A SPECIALIST, IT MAY TAKE 1-2 WEEKS TO SCHEDULE/PROCESS THE REFERRAL. IF YOU HAVE NOT HEARD FROM US/SPECIALIST IN TWO WEEKS, PLEASE GIVE [place] A CALL AT [phone].   THE PATIENT IS ENCOURAGED TO PRACTICE SOCIAL DISTANCING DUE TO THE COVID-19 PANDEMIC.

## 2022-10-12 LAB — CBC
Hematocrit: 43.1 % (ref 34.0–46.6)
Hemoglobin: 14.5 g/dL (ref 11.1–15.9)
MCH: 29.7 pg (ref 26.6–33.0)
MCHC: 33.6 g/dL (ref 31.5–35.7)
MCV: 88 fL (ref 79–97)
Platelets: 352 10*3/uL (ref 150–450)
RBC: 4.89 x10E6/uL (ref 3.77–5.28)
RDW: 11.9 % (ref 11.7–15.4)
WBC: 9 10*3/uL (ref 3.4–10.8)

## 2022-10-12 LAB — TSH: TSH: 0.849 u[IU]/mL (ref 0.450–4.500)

## 2022-10-12 LAB — HCG, SERUM, QUALITATIVE: hCG,Beta Subunit,Qual,Serum: NEGATIVE m[IU]/mL (ref <6)

## 2022-10-24 ENCOUNTER — Ambulatory Visit: Payer: Medicaid Other

## 2022-10-24 DIAGNOSIS — Z111 Encounter for screening for respiratory tuberculosis: Secondary | ICD-10-CM | POA: Diagnosis not present

## 2022-10-26 LAB — QUANTIFERON-TB GOLD PLUS
QuantiFERON Mitogen Value: >10 IU/mL
QuantiFERON Nil Value: 0.02 IU/mL
QuantiFERON TB1 Ag Value: 0.02 IU/mL
QuantiFERON TB2 Ag Value: 0.03 IU/mL
QuantiFERON-TB Gold Plus: NEGATIVE

## 2022-10-27 ENCOUNTER — Ambulatory Visit
Admission: RE | Admit: 2022-10-27 | Discharge: 2022-10-27 | Disposition: A | Discharge status: Home / Self Care | Payer: Medicaid Other | Source: Ambulatory Visit | Attending: Internal Medicine | Admitting: Internal Medicine

## 2022-10-27 DIAGNOSIS — R29898 Other symptoms and signs involving the musculoskeletal system: Secondary | ICD-10-CM

## 2022-11-01 ENCOUNTER — Inpatient Hospital Stay: Admission: RE | Admit: 2022-11-01 | Payer: Medicaid Other | Source: Ambulatory Visit

## 2022-11-08 DIAGNOSIS — G4733 Obstructive sleep apnea (adult) (pediatric): Secondary | ICD-10-CM | POA: Diagnosis not present

## 2022-11-13 ENCOUNTER — Ambulatory Visit: Payer: Medicaid Other | Admitting: Obstetrics and Gynecology

## 2022-11-15 ENCOUNTER — Ambulatory Visit (HOSPITAL_COMMUNITY): Payer: Self-pay

## 2022-12-09 DIAGNOSIS — G4733 Obstructive sleep apnea (adult) (pediatric): Secondary | ICD-10-CM | POA: Diagnosis not present

## 2023-01-17 ENCOUNTER — Encounter: Payer: Self-pay | Admitting: Nurse Practitioner

## 2023-01-17 ENCOUNTER — Ambulatory Visit (INDEPENDENT_AMBULATORY_CARE_PROVIDER_SITE_OTHER): Payer: Medicaid Other | Admitting: Nurse Practitioner

## 2023-01-17 VITALS — BP 138/78 | HR 78 | Temp 98.6°F | Ht 68.5 in | Wt 216.0 lb

## 2023-01-17 DIAGNOSIS — H538 Other visual disturbances: Secondary | ICD-10-CM

## 2023-01-17 DIAGNOSIS — R7303 Prediabetes: Secondary | ICD-10-CM | POA: Diagnosis not present

## 2023-01-17 DIAGNOSIS — E010 Iodine-deficiency related diffuse (endemic) goiter: Secondary | ICD-10-CM | POA: Diagnosis not present

## 2023-01-17 DIAGNOSIS — R1319 Other dysphagia: Secondary | ICD-10-CM | POA: Diagnosis not present

## 2023-01-17 DIAGNOSIS — E042 Nontoxic multinodular goiter: Secondary | ICD-10-CM

## 2023-01-17 NOTE — Progress Notes (Addendum)
I,Sheena H Holbrook,acting as a Neurosurgeonscribe for Arnette FeltsJanece Kartier Bennison, FNP.,have documented all relevant documentation on the behalf of Arnette FeltsJanece Moishy Laday, FNP,as directed by  Arnette FeltsJanece Zyanna Leisinger, FNP while in the presence of Arnette FeltsJanece Glendon Fiser, FNP.    Subjective:     Patient ID: Deanna Sparks , female    DOB: 11-15-1975 , 47 y.o.   MRN: [id number]   Chief Complaint  Patient presents with   Thyroid Problem   Pre-Diabetes    HPI  Patient presents today for thyroid concern. Patient reports a constant sensation of there being something in her throat, pain with swallowing started a couple weeks ago off and on. She has seen Endo several years ago. She would also like her Hgb A1C checked as she is pre-diabetic. She reports having some blurry vision and trouble focusing; she does have an ophthalmology appointment in May. She also reports recurrent dull headaches in various parts of her head; she has had CT scan, normal.    Thyroid Problem Presents for follow-up visit. Patient reports no anxiety, fatigue or hoarse voice. (Dysphagia )       Past Medical History:  Diagnosis Date   Genital herpes    last outbreak 12/2011   Hypertension    Vaginal Pap smear, abnormal      Family History  Problem Relation Age of Onset   Hypertension Mother    Healthy Father    Thyroid disease Brother    Anesthesia problems Neg Hx    Other Neg Hx      Current Outpatient Medications:    acyclovir (ZOVIRAX) 400 MG tablet, Take 400 mg by mouth 2 (two) times daily., Disp: , Rfl:    cholecalciferol (VITAMIN D3) 25 MCG (1000 UNIT) tablet, Take 1,000 Units by mouth daily., Disp: , Rfl:    lisinopril-hydrochlorothiazide (ZESTORETIC) 20-12.5 MG tablet, Take 1 tablet by mouth daily., Disp: 90 tablet, Rfl: 2   fluticasone (FLONASE) 50 MCG/ACT nasal spray, Place 1 spray into both nostrils daily. (Patient not taking: Reported on 01/17/2023), Disp: 16 g, Rfl: 0   levocetirizine (XYZAL) 5 MG tablet, Take 1 tablet (5 mg total) by mouth every  evening., Disp: 30 tablet, Rfl: 0   Allergies  Allergen Reactions   Codeine Itching   Ultram [Tramadol] Itching     Review of Systems  Constitutional:  Negative for fatigue.  HENT:  Negative for hoarse voice.   Eyes:  Positive for visual disturbance.  Neurological:  Positive for headaches.  Psychiatric/Behavioral: Negative.  The patient is not nervous/anxious.   All other systems reviewed and are negative.    Today's Vitals   01/17/23 0927  BP: 138/78  Pulse: 78  Temp: 98.6 F (37 C)  TempSrc: Oral  SpO2: 97%  Weight: 216 lb (98 kg)  Height: 5' 8.5" (1.74 m)   Body mass index is 32.36 kg/m.   Objective:  Physical Exam Vitals reviewed.  Constitutional:      General: She is not in acute distress.    Appearance: Normal appearance. She is well-developed. She is obese.  Cardiovascular:     Rate and Rhythm: Normal rate and regular rhythm.     Pulses: Normal pulses.     Heart sounds: Normal heart sounds. No murmur heard. Pulmonary:     Effort: Pulmonary effort is normal.     Breath sounds: Normal breath sounds.  Chest:     Chest wall: No tenderness.  Musculoskeletal:        General: Normal range of motion.  Skin:  General: Skin is warm and dry.     Capillary Refill: Capillary refill takes less than 2 seconds.  Neurological:     General: No focal deficit present.     Mental Status: She is alert and oriented to person, place, and time.  Psychiatric:        Mood and Affect: Mood normal.        Behavior: Behavior normal.        Thought Content: Thought content normal.        Judgment: Judgment normal.     Vision Screening   Right eye Left eye Both eyes  Without correction 20/20 20/20 20/20   With correction          Assessment And Plan:     1. Thyromegaly Comments: Will recheck thyroid levels and thyroid ultrasound - US THYROID; Future  2. Other dysphagia Comments: History of thyromegaly, will recheck thyroid Ultrasound. - US THYROID; Future  3.  Prediabetes Comments: Will check HgbA1c. - Hemoglobin A1c  4. Blurred vision Comments: vision exam shows 20/20, she is still to follow up with Opthalmology - Hemoglobin A1c - TSH   Patient was given opportunity to ask questions. Patient verbalized understanding of the plan and was able to repeat key elements of the plan. All questions were answered to their satisfaction.  Arnette Felts, FNP   I, Arnette Felts, FNP, have reviewed all documentation for this visit. The documentation on 01/17/23 for the exam, diagnosis, procedures, and orders are all accurate and complete.   IF YOU HAVE BEEN REFERRED TO A SPECIALIST, IT MAY TAKE 1-2 WEEKS TO SCHEDULE/PROCESS THE REFERRAL. IF YOU HAVE NOT HEARD FROM US/SPECIALIST IN TWO WEEKS, PLEASE GIVE [place] A CALL AT [phone] X [phone].   THE PATIENT IS ENCOURAGED TO PRACTICE SOCIAL DISTANCING DUE TO THE COVID-19 PANDEMIC.

## 2023-01-18 LAB — HEMOGLOBIN A1C
Est. average glucose Bld gHb Est-mCnc: 114 mg/dL
Hgb A1c MFr Bld: 5.6 % (ref 4.8–5.6)

## 2023-01-18 LAB — TSH: TSH: 0.807 u[IU]/mL (ref 0.450–4.500)

## 2023-01-23 ENCOUNTER — Other Ambulatory Visit: Payer: Self-pay | Admitting: Internal Medicine

## 2023-01-23 DIAGNOSIS — Z0000 Encounter for general adult medical examination without abnormal findings: Secondary | ICD-10-CM

## 2023-01-31 DIAGNOSIS — G5603 Carpal tunnel syndrome, bilateral upper limbs: Secondary | ICD-10-CM | POA: Diagnosis not present

## 2023-02-06 ENCOUNTER — Encounter (HOSPITAL_COMMUNITY): Payer: Self-pay

## 2023-02-06 ENCOUNTER — Ambulatory Visit (HOSPITAL_COMMUNITY)
Admission: RE | Admit: 2023-02-06 | Discharge: 2023-02-06 | Disposition: A | Discharge status: Home / Self Care | Payer: Medicaid Other | Source: Ambulatory Visit | Attending: Internal Medicine | Admitting: Internal Medicine

## 2023-02-06 VITALS — BP 123/82 | HR 79 | Temp 98.2°F | Resp 16

## 2023-02-06 DIAGNOSIS — Z1152 Encounter for screening for COVID-19: Secondary | ICD-10-CM | POA: Diagnosis not present

## 2023-02-06 DIAGNOSIS — J309 Allergic rhinitis, unspecified: Secondary | ICD-10-CM | POA: Diagnosis not present

## 2023-02-06 MED ORDER — LEVOCETIRIZINE DIHYDROCHLORIDE 5 MG PO TABS: 5.0000 mg | ORAL_TABLET | Freq: Every evening | ORAL | 0 refills | Status: DC

## 2023-02-06 NOTE — ED Triage Notes (Signed)
Patient c/o generalized body aches, nasal congestion, and headache x 5 days.  Patient reports that she has taken Zyrtec, headac congestion medication, theraflu with no relief.

## 2023-02-06 NOTE — ED Provider Notes (Signed)
[facility]    CSN: [id number] Arrival date & time: 02/06/23  1554      History   Chief Complaint Chief Complaint  Patient presents with   Generalized Body Aches    Coughing congestion - Entered by patient   Headache   Nasal Congestion    HPI Deanna Sparks is a 47 y.o. female with history of hypertension presents to urgent care today with complaints of headache, nasal congestion and fatigue.  Patient states symptoms ongoing for 5 days.  Symptoms initially started with "scratchy" throat and mild cough which have both resolved.  Patient states ears feel "stopped up", frequent sneezing and watery eyes.  Taking ibuprofen for body aches and last dose this a.m.  Has been taking Claritin and Flonase daily with little relief.  Patient denies any dizziness, chest pain, shortness of breath.  Patient works as a Lawyer at [employer].   Past Medical History:  Diagnosis Date   Genital herpes    last outbreak 12/2011   Hypertension    Vaginal Pap smear, abnormal     Patient Active Problem List   Diagnosis Date Noted   ASCUS with positive high risk HPV cervical 08/24/2021   Well woman exam 08/24/2021   Anxiety 06/22/2021   OSA on CPAP 06/22/2021   Sleeps in sitting position due to orthopnea 03/14/2021   Loud snoring 03/14/2021   Insufficient sleep syndrome 03/14/2021   Nocturia more than twice per night 03/14/2021   Caregiver stress syndrome 03/14/2021   Other insomnia 03/14/2021   Sleep apnea with cognitive complaints 03/14/2021   Multinodular goiter (nontoxic) 09/24/2020   GAD (generalized anxiety disorder) 05/29/2019   HSV infection 05/29/2019   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   Vitamin D deficiency 05/28/2019   Essential hypertension 01/21/2019   Goiter 01/21/2019   Postnasal drip 01/21/2019   Plantar fasciitis 10/10/2018   Carpal tunnel syndrome 10/22/2015   Carpal tunnel syndrome, bilateral 10/22/2015   Preterm labor 04/03/2012    Past Surgical History:   Procedure Laterality Date   CESAREAN SECTION     COLPOSCOPY  10/03/2021   LIPOMA EXCISION  2008   removed from forehead    OB History     Gravida  3   Para  3   Term  2   Preterm  1   AB  0   Living  3      SAB  0   IAB  0   Ectopic  0   Multiple  0   Live Births  1            Home Medications    Prior to Admission medications   Medication Sig Start Date End Date Taking? Authorizing Provider  levocetirizine (XYZAL) 5 MG tablet Take 1 tablet (5 mg total) by mouth every evening. 02/06/23 03/08/23 Yes Rolla Etienne, NP  acyclovir (ZOVIRAX) 400 MG tablet Take 400 mg by mouth 2 (two) times daily. 01/04/23   [provider]  cholecalciferol (VITAMIN D3) 25 MCG (1000 UNIT) tablet Take 1,000 Units by mouth daily.    [provider]  fluticasone (FLONASE) 50 MCG/ACT nasal spray Place 1 spray into both nostrils daily. Patient not taking: Reported on 01/17/2023 09/17/22   Lorre Munroe, NP  lisinopril-hydrochlorothiazide (ZESTORETIC) 20-12.5 MG tablet Take 1 tablet by mouth daily. 04/24/22   Dorothyann Peng, MD    Family History Family History  Problem Relation Age of Onset   Hypertension Mother    Healthy Father  Thyroid disease Brother    Anesthesia problems Neg Hx    Other Neg Hx     Social History Social History   Tobacco Use   Smoking status: Never   Smokeless tobacco: Never  Vaping Use   Vaping Use: Never used  Substance Use Topics   Alcohol use: Yes    Comment: occasionally   Drug use: Never     Allergies   Codeine and Ultram [tramadol]   Review of Systems As stated in HPI otherwise negative   Physical Exam Triage Vital Signs ED Triage Vitals  Enc Vitals Group     BP 02/06/23 1637 123/82     Pulse Rate 02/06/23 1637 79     Resp 02/06/23 1637 16     Temp 02/06/23 1637 98.2 F (36.8 C)     Temp Source 02/06/23 1637 Oral     SpO2 02/06/23 1637 98 %     Weight --      Height --      Head Circumference --       Peak Flow --      Pain Score 02/06/23 1639 6     Pain Loc --      Pain Edu? --      Excl. in GC? --    No data found.  Updated Vital Signs BP 123/82 (BP Location: Left Arm)   Pulse 79   Temp 98.2 F (36.8 C) (Oral)   Resp 16   LMP 01/15/2023   SpO2 98%   Visual Acuity Right Eye Distance:   Left Eye Distance:   Bilateral Distance:    Right Eye Near:   Left Eye Near:    Bilateral Near:     Physical Exam Constitutional:      General: She is not in acute distress.    Appearance: She is well-developed. She is not ill-appearing or toxic-appearing.  HENT:     Head:     Comments: TMs bilateral air-fluid levels, no erythema or purulence.  Boggy, pale nasal turbinates.  No sinus tenderness.+ PND without any posterior pharyngeal swelling or exudate.    Mouth/Throat:     Mouth: Mucous membranes are moist.     Pharynx: Oropharynx is clear.  Eyes:     Extraocular Movements: Extraocular movements intact.  Cardiovascular:     Rate and Rhythm: Normal rate and regular rhythm.     Heart sounds: No murmur heard.    No friction rub. No gallop.  Pulmonary:     Effort: Pulmonary effort is normal. No respiratory distress.     Breath sounds: Normal breath sounds. No wheezing, rhonchi or rales.  Musculoskeletal:     Cervical back: Normal range of motion and neck supple. No rigidity.  Lymphadenopathy:     Cervical: No cervical adenopathy.  Skin:    General: Skin is warm and dry.  Neurological:     Mental Status: She is alert.  Psychiatric:        Mood and Affect: Mood normal.        Behavior: Behavior normal.      UC Treatments / Results  Labs (all labs ordered are listed, but only abnormal results are displayed) Labs Reviewed  SARS CORONAVIRUS 2 (TAT 6-24 HRS)    EKG   Radiology No results found.  Procedures Procedures (including critical care time)  Medications Ordered in UC Medications - No data to display  Initial Impression / Assessment and Plan / UC Course   I have reviewed the  triage vital signs and the nursing notes.  Pertinent labs & imaging results that were available during my care of the patient were reviewed by me and considered in my medical decision making (see chart for details).  Allergic rhinitis Screening for COVID Patient's history and exam consistent with allergic rhinitis.  Cannot rule out underlying viral infection.  Low suspicion for flu as patient has remained afebrile throughout illness.  Will screen for COVID-19 per patient's request.  No evidence of bacterial etiology at this time that would necessitate antibiotic.  Symptomatic treatment with Xyzal, Flonase, nasal saline, increase fluid intake.  Should symptoms worsen or persist patient instructed to return to discuss possible antibiotic treatment.  Final Clinical Impressions(s) / UC Diagnoses   Final diagnoses:  Allergic rhinitis, unspecified seasonality, unspecified trigger  Encounter for screening for COVID-19     Discharge Instructions      As we discussed it appears your symptoms are related to seasonal allergies and possible viral infection.  I do not see any evidence of bacterial infection at this time that would require antibiotic treatment.  I know you have been taking Claritin and Zyrtec without much relief.  Go ahead and stop those and start levocetirizine (Xyzal) daily to see if this helps.  I sent a prescription of this to your pharmacy to see if it would be less expensive than over-the-counter.  Continue daily Flonase.  Add nasal saline.  Your COVID testing results should be available in the next 12 to 24 hours via your MyChart.  Please follow-up should you experience any worsening or persistent symptoms.     ED Prescriptions     Medication Sig Dispense Auth. Provider   levocetirizine (XYZAL) 5 MG tablet Take 1 tablet (5 mg total) by mouth every evening. 30 tablet Rolla Etienne, NP      PDMP not reviewed this encounter.   Rolla Etienne,  NP 02/06/23 [phone]

## 2023-02-06 NOTE — Discharge Instructions (Addendum)
As we discussed it appears your symptoms are related to seasonal allergies and possible viral infection.  I do not see any evidence of bacterial infection at this time that would require antibiotic treatment.  I know you have been taking Claritin and Zyrtec without much relief.  Go ahead and stop those and start levocetirizine (Xyzal) daily to see if this helps.  I sent a prescription of this to your pharmacy to see if it would be less expensive than over-the-counter.  Continue daily Flonase.  Add nasal saline.  Your COVID testing results should be available in the next 12 to 24 hours via your MyChart.  Please follow-up should you experience any worsening or persistent symptoms.

## 2023-02-07 LAB — SARS CORONAVIRUS 2 (TAT 6-24 HRS): SARS Coronavirus 2: NEGATIVE

## 2023-02-08 ENCOUNTER — Ambulatory Visit: Payer: Medicaid Other | Admitting: Internal Medicine

## 2023-02-13 ENCOUNTER — Ambulatory Visit
Admission: RE | Admit: 2023-02-13 | Discharge: 2023-02-13 | Disposition: A | Discharge status: Home / Self Care | Payer: Medicaid Other | Source: Ambulatory Visit | Attending: Nurse Practitioner | Admitting: Nurse Practitioner

## 2023-02-13 DIAGNOSIS — E010 Iodine-deficiency related diffuse (endemic) goiter: Secondary | ICD-10-CM

## 2023-02-13 DIAGNOSIS — R1319 Other dysphagia: Secondary | ICD-10-CM

## 2023-02-27 ENCOUNTER — Ambulatory Visit
Admission: RE | Admit: 2023-02-27 | Discharge: 2023-02-27 | Disposition: A | Discharge status: Home / Self Care | Payer: Medicaid Other | Source: Ambulatory Visit | Attending: Internal Medicine | Admitting: Internal Medicine

## 2023-02-27 DIAGNOSIS — Z0000 Encounter for general adult medical examination without abnormal findings: Secondary | ICD-10-CM

## 2023-02-27 DIAGNOSIS — Z1231 Encounter for screening mammogram for malignant neoplasm of breast: Secondary | ICD-10-CM | POA: Diagnosis not present

## 2023-02-28 DIAGNOSIS — M778 Other enthesopathies, not elsewhere classified: Secondary | ICD-10-CM | POA: Diagnosis not present

## 2023-03-14 ENCOUNTER — Ambulatory Visit (HOSPITAL_COMMUNITY): Payer: Self-pay

## 2023-03-15 ENCOUNTER — Other Ambulatory Visit: Payer: Self-pay | Admitting: Internal Medicine

## 2023-03-15 DIAGNOSIS — H40013 Open angle with borderline findings, low risk, bilateral: Secondary | ICD-10-CM | POA: Diagnosis not present

## 2023-03-15 DIAGNOSIS — G43109 Migraine with aura, not intractable, without status migrainosus: Secondary | ICD-10-CM | POA: Diagnosis not present

## 2023-03-15 DIAGNOSIS — H04123 Dry eye syndrome of bilateral lacrimal glands: Secondary | ICD-10-CM | POA: Diagnosis not present

## 2023-03-15 MED ORDER — FLUCONAZOLE 150 MG PO TABS: 150.0000 mg | ORAL_TABLET | Freq: Every day | ORAL | 0 refills | Status: DC

## 2023-03-23 ENCOUNTER — Other Ambulatory Visit (HOSPITAL_COMMUNITY)
Admission: RE | Admit: 2023-03-23 | Discharge: 2023-03-23 | Disposition: A | Discharge status: Home / Self Care | Payer: Medicaid Other | Source: Ambulatory Visit | Attending: Family Medicine | Admitting: Family Medicine

## 2023-03-23 ENCOUNTER — Other Ambulatory Visit: Payer: Self-pay

## 2023-03-23 ENCOUNTER — Ambulatory Visit (INDEPENDENT_AMBULATORY_CARE_PROVIDER_SITE_OTHER): Payer: Medicaid Other

## 2023-03-23 VITALS — BP 136/89 | HR 75 | Wt 212.2 lb

## 2023-03-23 DIAGNOSIS — N898 Other specified noninflammatory disorders of vagina: Secondary | ICD-10-CM

## 2023-03-23 NOTE — Progress Notes (Signed)
Deanna Sparks is here with concern of vaginal itching. Describes itching on perineum and rectum; denies history of or current hemorrhoids. Patient reports taking Diflucan last week x 2. Itching did improve but then returned. Has also trialed OTC non drowsy allergy medicine with some improvement. Denies any feelings of dryness. Has recently stopped using any soap in this area because that made the itching worse.  Self swab instructions given and specimen obtained. Explained patient will be contacted with any abnormal results. Recommended patient schedule follow up visit with provider if results negative for infection.  Marjo Bicker, RN 03/23/2023  9:29 AM

## 2023-03-26 LAB — CERVICOVAGINAL ANCILLARY ONLY
Bacterial Vaginitis (gardnerella): NEGATIVE
Candida Glabrata: NEGATIVE
Candida Vaginitis: NEGATIVE
Chlamydia: NEGATIVE
Comment: NEGATIVE
Comment: NEGATIVE
Comment: NEGATIVE
Comment: NEGATIVE
Comment: NEGATIVE
Comment: NORMAL
Neisseria Gonorrhea: NEGATIVE
Trichomonas: NEGATIVE

## 2023-04-05 DIAGNOSIS — H532 Diplopia: Secondary | ICD-10-CM | POA: Diagnosis not present

## 2023-04-05 DIAGNOSIS — G43109 Migraine with aura, not intractable, without status migrainosus: Secondary | ICD-10-CM | POA: Diagnosis not present

## 2023-04-05 DIAGNOSIS — H5052 Exophoria: Secondary | ICD-10-CM | POA: Diagnosis not present

## 2023-04-30 ENCOUNTER — Ambulatory Visit (HOSPITAL_COMMUNITY)
Admission: RE | Admit: 2023-04-30 | Discharge: 2023-04-30 | Disposition: A | Discharge status: Home / Self Care | Payer: Medicaid Other | Source: Ambulatory Visit | Attending: Internal Medicine | Admitting: Internal Medicine

## 2023-04-30 ENCOUNTER — Encounter (HOSPITAL_COMMUNITY): Payer: Self-pay

## 2023-04-30 VITALS — BP 150/102 | HR 74 | Temp 98.1°F | Resp 18 | Ht 68.5 in | Wt 215.0 lb

## 2023-04-30 DIAGNOSIS — R030 Elevated blood-pressure reading, without diagnosis of hypertension: Secondary | ICD-10-CM | POA: Insufficient documentation

## 2023-04-30 DIAGNOSIS — G4489 Other headache syndrome: Secondary | ICD-10-CM | POA: Diagnosis not present

## 2023-04-30 DIAGNOSIS — R5383 Other fatigue: Secondary | ICD-10-CM | POA: Diagnosis not present

## 2023-04-30 DIAGNOSIS — Z1152 Encounter for screening for COVID-19: Secondary | ICD-10-CM | POA: Diagnosis not present

## 2023-04-30 MED ORDER — ACETAMINOPHEN 325 MG PO TABS
975.0000 mg | ORAL_TABLET | Freq: Once | ORAL | Status: AC
  Administered 2023-04-30: 975 mg via ORAL

## 2023-04-30 MED ORDER — ACETAMINOPHEN 325 MG PO TABS
ORAL_TABLET | ORAL | Status: AC
  Filled 2023-04-30: qty 3

## 2023-04-30 NOTE — ED Triage Notes (Signed)
Patient here today with c/o headache since yesterday. She also c/o nausea, dizziness, and neck pain. She tried taking IBU with slight relief.

## 2023-04-30 NOTE — Discharge Instructions (Signed)
You may take Tylenol up to 1000 mg every 8 hours for headache  If the HA gets worse as well as the neck pain and you get a fever please go to the ER

## 2023-04-30 NOTE — ED Provider Notes (Signed)
[facility]    CSN: [id number] Arrival date & time: 04/30/23  1557      History   Chief Complaint Chief Complaint  Patient presents with   Headache    Dull pain causes dizziness and upset stomach - Entered by patient    HPI Deanna Sparks is a 47 y.o. female who presetns with onset of HA since yesterday with nausea, dizziness and neck pain. She has taken Ibuprofen 800 mg with little relief. Pain level before Ibuprofen was 10/10, now is 5/10. Her HA is located on the top and occipital region. Has hx of HA's but this is different than her usual. Has not had aura or photophobia.  Has been fatigued and has had poor appetite. Denies injuring her neck. Denies URI symptoms or fever. Had vertigo with nausea yesterday. Denies dizziness or nausea today. Has been sweating from perimenopause and not more since HA. Has been drinking plenty of water.  Her BP is normally in the 120's systolic when she monitors it and was this yesterday.    Past Medical History:  Diagnosis Date   Genital herpes    last outbreak 12/2011   Hypertension    Vaginal Pap smear, abnormal     Patient Active Problem List   Diagnosis Date Noted   ASCUS with positive high risk HPV cervical 08/24/2021   Well woman exam 08/24/2021   Anxiety 06/22/2021   OSA on CPAP 06/22/2021   Sleeps in sitting position due to orthopnea 03/14/2021   Loud snoring 03/14/2021   Insufficient sleep syndrome 03/14/2021   Nocturia more than twice per night 03/14/2021   Caregiver stress syndrome 03/14/2021   Other insomnia 03/14/2021   Sleep apnea with cognitive complaints 03/14/2021   Multinodular goiter (nontoxic) 09/24/2020   GAD (generalized anxiety disorder) 05/29/2019   HSV infection 05/29/2019   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   Vitamin D deficiency 05/28/2019   Essential hypertension 01/21/2019   Goiter 01/21/2019   Postnasal drip 01/21/2019   Plantar fasciitis 10/10/2018   Carpal tunnel syndrome 10/22/2015   Carpal  tunnel syndrome, bilateral 10/22/2015   Preterm labor 04/03/2012    Past Surgical History:  Procedure Laterality Date   CESAREAN SECTION     COLPOSCOPY  10/03/2021   LIPOMA EXCISION  2008   removed from forehead    OB History     Gravida  3   Para  3   Term  2   Preterm  1   AB  0   Living  3      SAB  0   IAB  0   Ectopic  0   Multiple  0   Live Births  1            Home Medications    Prior to Admission medications   Medication Sig Start Date End Date Taking? Authorizing Provider  acyclovir (ZOVIRAX) 400 MG tablet Take 400 mg by mouth 2 (two) times daily. 01/04/23  Yes [provider]  cholecalciferol (VITAMIN D3) 25 MCG (1000 UNIT) tablet Take 1,000 Units by mouth daily.   Yes [provider]  lisinopril-hydrochlorothiazide (ZESTORETIC) 20-12.5 MG tablet Take 1 tablet by mouth daily. 04/24/22  Yes Dorothyann Peng, MD    Family History Family History  Problem Relation Age of Onset   Hypertension Mother    Healthy Father    Thyroid disease Brother    Anesthesia problems Neg Hx    Other Neg Hx     Social History Social  History   Tobacco Use   Smoking status: Never   Smokeless tobacco: Never  Vaping Use   Vaping Use: Never used  Substance Use Topics   Alcohol use: Yes    Comment: occasionally   Drug use: Never     Allergies   Codeine and Ultram [tramadol]   Review of Systems Review of Systems As noted in HPI  Physical Exam Triage Vital Signs ED Triage Vitals  Enc Vitals Group     BP 04/30/23 1621 (!) 150/102     Pulse Rate 04/30/23 1621 74     Resp 04/30/23 1621 18     Temp 04/30/23 1621 98.1 F (36.7 C)     Temp Source 04/30/23 1621 Oral     SpO2 04/30/23 1621 98 %     Weight 04/30/23 1620 215 lb (97.5 kg)     Height 04/30/23 1620 5' 8.5" (1.74 m)     Head Circumference --      Peak Flow --      Pain Score 04/30/23 1619 6     Pain Loc --      Pain Edu? --      Excl. in GC? --    No data  found.  Updated Vital Signs BP (!) 150/102 (BP Location: Left Arm)   Pulse 74   Temp 98.1 F (36.7 C) (Oral)   Resp 18   Ht 5' 8.5" (1.74 m)   Wt 215 lb (97.5 kg)   LMP 04/30/2023 (Exact Date)   SpO2 98%   BMI 32.22 kg/m  Repeated BP 156/94 BP repeated after pain level went to 3/10 140/100  Visual Acuity Right Eye Distance:   Left Eye Distance:   Bilateral Distance:    Right Eye Near:   Left Eye Near:    Bilateral Near:      Physical Exam Vitals signs and nursing note reviewed.  Constitutional:      General: She is not in acute distress.    Appearance: she is well-developed and normal weight. She is not ill-appearing, toxic-appearing or diaphoretic.  HENT:     Head: Normocephalic.  Eyes:     Extraocular Movements: Extraocular movements intact.     Pupils: Pupils are equal, round, and reactive to light.  Neck:     Musculoskeletal: Neck supple. No neck rigidity.     Meningeal: Brudzinski's sign absent.  Cardiovascular:     Rate and Rhythm: Normal rate and regular rhythm.     Heart sounds: No murmur.  Pulmonary:     Effort: Pulmonary effort is normal.     Breath sounds: Normal breath sounds. No wheezing, rhonchi or rales.    Musculoskeletal: Normal range of motion.  Lymphadenopathy:     Cervical: No cervical adenopathy.  Skin:    General: Skin is warm and dry.  Neurological:     Mental Status: He is alert.     Cranial Nerves: No cranial nerve deficit or facial asymmetry.     Sensory: No sensory deficit.     Motor: No weakness.     Coordination: Romberg sign negative. Coordination normal.     Gait: Gait normal.     Deep Tendon Reflexes: Reflexes normal.     Comments: Normal Romberg, propioception, finger to nose, tandem gait.   Psychiatric:        Mood and Affect: Mood normal.        Speech: Speech normal.        Behavior: Behavior normal.  UC Treatments / Results  Labs (all labs ordered are listed, but only abnormal results are displayed) Labs  Reviewed  SARS CORONAVIRUS 2 (TAT 6-24 HRS)    EKG   Radiology No results found.  Procedures Procedures (including critical care time)  Medications Ordered in UC Medications  acetaminophen (TYLENOL) tablet 975 mg (975 mg Oral Given 04/30/23 1717)    Initial Impression / Assessment and Plan / UC Course  I have reviewed the triage vital signs and the nursing notes. She was given Tylenol 975 mg PO and 30 minutes later I checked her pain level had gone down to 3/10.   HA with neck pain  improved with Tylenol Elevated BP  We will call her when the Covid test is back if  positive.  She was advised to d/c Ibuprofen and only take Tylenol.    Final Clinical Impressions(s) / UC Diagnoses   Final diagnoses:  Other headache syndrome  Other fatigue  Elevated blood pressure reading     Discharge Instructions      You may take Tylenol up to 1000 mg every 8 hours for headache  If the HA gets worse as well as the neck pain and you get a fever please go to the ER      ED Prescriptions   None    PDMP not reviewed this encounter.   Garey Ham, Cordelia Poche 04/30/23 1758

## 2023-05-01 LAB — SARS CORONAVIRUS 2 (TAT 6-24 HRS): SARS Coronavirus 2: NEGATIVE

## 2023-05-08 DIAGNOSIS — G5603 Carpal tunnel syndrome, bilateral upper limbs: Secondary | ICD-10-CM | POA: Diagnosis not present

## 2023-05-23 ENCOUNTER — Other Ambulatory Visit: Payer: Self-pay

## 2023-05-23 MED ORDER — ACYCLOVIR 400 MG PO TABS: 400.0000 mg | ORAL_TABLET | Freq: Two times a day (BID) | ORAL | 2 refills | Status: DC

## 2023-06-20 DIAGNOSIS — G5603 Carpal tunnel syndrome, bilateral upper limbs: Secondary | ICD-10-CM | POA: Diagnosis not present

## 2023-07-05 ENCOUNTER — Ambulatory Visit (HOSPITAL_COMMUNITY)
Admission: RE | Admit: 2023-07-05 | Discharge: 2023-07-05 | Disposition: A | Discharge status: Home / Self Care | Payer: Medicaid Other | Source: Ambulatory Visit | Attending: Emergency Medicine | Admitting: Emergency Medicine

## 2023-07-05 ENCOUNTER — Encounter (HOSPITAL_COMMUNITY): Payer: Self-pay

## 2023-07-05 ENCOUNTER — Telehealth: Payer: Self-pay

## 2023-07-05 VITALS — BP 143/89 | HR 77 | Temp 99.0°F | Resp 16 | Ht 68.5 in | Wt 215.0 lb

## 2023-07-05 DIAGNOSIS — Z1152 Encounter for screening for COVID-19: Secondary | ICD-10-CM | POA: Diagnosis not present

## 2023-07-05 DIAGNOSIS — H9203 Otalgia, bilateral: Secondary | ICD-10-CM | POA: Insufficient documentation

## 2023-07-05 NOTE — Discharge Instructions (Signed)
Try nasal saline spray to help relieve your ear pain - your ears and nose and throat are all connected and sometimes ear pain can be due to pressure from nasal congestion.   You will get a call if test is positive, you will not get a call if test is negative but you can check results in MyChart if you have a MyChart account.

## 2023-07-05 NOTE — ED Provider Notes (Signed)
[facility]    CSN: [id number] Arrival date & time: 07/05/23  0802      History   Chief Complaint Chief Complaint  Patient presents with   Ear Fullness    Eat and head pain - Entered by patient    HPI Deanna Sparks is a 47 y.o. female. Patient here today with c/o bilat ear pain and headache since Tuesday. Patient had a tooth abscess last Friday and she has been taking Penicillin prescribed by dentist for the infection. She is still taking them and her abscess feels better. She has been taking ibuprofen for the ear pain with no relief.  She is worried she may have COVID as the last time she had COVID she had significant ear pain similar to current symptoms   Ear Fullness Pertinent negatives include no shortness of breath.    Past Medical History:  Diagnosis Date   Genital herpes    last outbreak 12/2011   Hypertension    Vaginal Pap smear, abnormal     Patient Active Problem List   Diagnosis Date Noted   ASCUS with positive high risk HPV cervical 08/24/2021   Well woman exam 08/24/2021   Anxiety 06/22/2021   OSA on CPAP 06/22/2021   Sleeps in sitting position due to orthopnea 03/14/2021   Loud snoring 03/14/2021   Insufficient sleep syndrome 03/14/2021   Nocturia more than twice per night 03/14/2021   Caregiver stress syndrome 03/14/2021   Other insomnia 03/14/2021   Sleep apnea with cognitive complaints 03/14/2021   Multinodular goiter (nontoxic) 09/24/2020   GAD (generalized anxiety disorder) 05/29/2019   HSV infection 05/29/2019   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   Vitamin D deficiency 05/28/2019   Essential hypertension 01/21/2019   Goiter 01/21/2019   Postnasal drip 01/21/2019   Plantar fasciitis 10/10/2018   Carpal tunnel syndrome 10/22/2015   Carpal tunnel syndrome, bilateral 10/22/2015   Preterm labor 04/03/2012    Past Surgical History:  Procedure Laterality Date   CESAREAN SECTION     COLPOSCOPY  10/03/2021   LIPOMA EXCISION  2008    removed from forehead    OB History     Gravida  3   Para  3   Term  2   Preterm  1   AB  0   Living  3      SAB  0   IAB  0   Ectopic  0   Multiple  0   Live Births  1            Home Medications    Prior to Admission medications   Medication Sig Start Date End Date Taking? Authorizing Provider  fluconazole (DIFLUCAN) 100 MG tablet Take 100 mg by mouth as directed. 06/29/23  Yes [provider]  penicillin v potassium (VEETID) 500 MG tablet Take 500 mg by mouth every 6 (six) hours. 06/29/23  Yes [provider]  acyclovir (ZOVIRAX) 400 MG tablet Take 1 tablet (400 mg total) by mouth 2 (two) times daily. 05/23/23   Dorothyann Peng, MD  cholecalciferol (VITAMIN D3) 25 MCG (1000 UNIT) tablet Take 1,000 Units by mouth daily.    [provider]  lisinopril-hydrochlorothiazide (ZESTORETIC) 20-12.5 MG tablet Take 1 tablet by mouth daily. 04/24/22   Dorothyann Peng, MD    Family History Family History  Problem Relation Age of Onset   Hypertension Mother    Healthy Father    Thyroid disease Brother    Anesthesia problems Neg Hx  Other Neg Hx     Social History Social History   Tobacco Use   Smoking status: Never   Smokeless tobacco: Never  Vaping Use   Vaping status: Never Used  Substance Use Topics   Alcohol use: Yes    Comment: occasionally   Drug use: Never     Allergies   Codeine and Ultram [tramadol]   Review of Systems Review of Systems  Constitutional:  Positive for chills. Negative for fever.  HENT:  Positive for ear pain. Negative for congestion, rhinorrhea and sore throat.   Respiratory:  Negative for cough and shortness of breath.      Physical Exam Triage Vital Signs ED Triage Vitals  Encounter Vitals Group     BP 07/05/23 0812 (!) 143/89     Systolic BP Percentile --      Diastolic BP Percentile --      Pulse Rate 07/05/23 0812 77     Resp 07/05/23 0812 16     Temp 07/05/23 0812 99 F (37.2 C)      Temp Source 07/05/23 0812 Oral     SpO2 07/05/23 0812 97 %     Weight 07/05/23 0812 215 lb (97.5 kg)     Height 07/05/23 0812 5' 8.5" (1.74 m)     Head Circumference --      Peak Flow --      Pain Score 07/05/23 0811 9     Pain Loc --      Pain Education --      Exclude from Growth Chart --    No data found.  Updated Vital Signs BP (!) 143/89 (BP Location: Right Arm)   Pulse 77   Temp 99 F (37.2 C) (Oral)   Resp 16   Ht 5' 8.5" (1.74 m)   Wt 215 lb (97.5 kg)   LMP 06/06/2023 (Approximate)   SpO2 97%   BMI 32.22 kg/m   Visual Acuity Right Eye Distance:   Left Eye Distance:   Bilateral Distance:    Right Eye Near:   Left Eye Near:    Bilateral Near:     Physical Exam Constitutional:      Appearance: Normal appearance. She is not ill-appearing or toxic-appearing.  HENT:     Right Ear: Tympanic membrane, ear canal and external ear normal.     Left Ear: Tympanic membrane, ear canal and external ear normal.     Nose: No congestion or rhinorrhea.     Mouth/Throat:     Mouth: Mucous membranes are moist.     Pharynx: Oropharynx is clear. No oropharyngeal exudate or posterior oropharyngeal erythema.  Cardiovascular:     Rate and Rhythm: Normal rate and regular rhythm.  Pulmonary:     Effort: Pulmonary effort is normal.     Breath sounds: Normal breath sounds.  Lymphadenopathy:     Head:     Right side of head: No submandibular adenopathy.     Left side of head: No submandibular adenopathy.     Cervical: No cervical adenopathy.  Neurological:     Mental Status: She is alert.      UC Treatments / Results  Labs (all labs ordered are listed, but only abnormal results are displayed) Labs Reviewed  SARS CORONAVIRUS 2 (TAT 6-24 HRS)    EKG   Radiology No results found.  Procedures Procedures (including critical care time)  Medications Ordered in UC Medications - No data to display  Initial Impression / Assessment and Plan /  UC Course  I have reviewed  the triage vital signs and the nursing notes.  Pertinent labs & imaging results that were available during my care of the patient were reviewed by me and considered in my medical decision making (see chart for details).     COVID results pending.  Discussed supportive care measures.  Given note for work for a couple of days.     Final Clinical Impressions(s) / UC Diagnoses   Final diagnoses:  Otalgia of both ears     Discharge Instructions      Try nasal saline spray to help relieve your ear pain - your ears and nose and throat are all connected and sometimes ear pain can be due to pressure from nasal congestion.   You will get a call if test is positive, you will not get a call if test is negative but you can check results in MyChart if you have a MyChart account.     ED Prescriptions   None    PDMP not reviewed this encounter.   Cathlyn Parsons, NP 07/05/23 [phone]

## 2023-07-05 NOTE — Transitions of Care (Post Inpatient/ED Visit) (Signed)
   07/05/2023  Name: Deanna Sparks MRN: [id number] DOB: 12/14/1975  Today's TOC FU Call Status:   Patient's Name and Date of Birth confirmed.  Transition Care Management Follow-up Telephone Call Date of Discharge: 07/05/23 Discharge Facility: [facility]) Type of Discharge: Emergency Department Reason for ED Visit: Other: How have you been since you were released from the hospital?: Same Any questions or concerns?: No  Items Reviewed: Did you receive and understand the discharge instructions provided?: Yes Medications obtained,verified, and reconciled?: Yes (Medications Reviewed) Any new allergies since your discharge?: No Dietary orders reviewed?: No Do you have support at home?: No  Medications Reviewed Today: Medications Reviewed Today   Medications were not reviewed in this encounter     Home Care and Equipment/Supplies: Were Home Health Services Ordered?: No Any new equipment or medical supplies ordered?: No  Functional Questionnaire: Do you need assistance with bathing/showering or dressing?: No Do you need assistance with meal preparation?: No Do you need assistance with eating?: No Do you have difficulty maintaining continence: No Do you need assistance with getting out of bed/getting out of a chair/moving?: No Do you have difficulty managing or taking your medications?: No  Follow up appointments reviewed: PCP Follow-up appointment confirmed?: Yes Date of PCP follow-up appointment?: 07/11/23 Follow-up Provider: Dorothyann Peng Specialist [facility] Follow-up appointment confirmed?: NA Do you need transportation to your follow-up appointment?: No Do you understand care options if your condition(s) worsen?: Yes-patient verbalized understanding    SIGNATURE Randa Lynn, CMA

## 2023-07-05 NOTE — ED Triage Notes (Signed)
Patient here today with c/o bilat ear pain and headache since Tuesday. Patient had a tooth abscess last Friday and she has been taking Penicillin for the infection. She is still taking them. She has been taking IBU with no relief.

## 2023-07-06 LAB — SARS CORONAVIRUS 2 (TAT 6-24 HRS): SARS Coronavirus 2: NEGATIVE

## 2023-07-11 ENCOUNTER — Encounter: Payer: Self-pay | Admitting: Family Medicine

## 2023-07-11 ENCOUNTER — Ambulatory Visit (INDEPENDENT_AMBULATORY_CARE_PROVIDER_SITE_OTHER): Payer: Medicaid Other | Admitting: Family Medicine

## 2023-07-11 VITALS — BP 120/84 | HR 93 | Temp 98.5°F | Ht 68.0 in | Wt 216.6 lb

## 2023-07-11 DIAGNOSIS — H938X3 Other specified disorders of ear, bilateral: Secondary | ICD-10-CM

## 2023-07-11 DIAGNOSIS — Z6832 Body mass index (BMI) 32.0-32.9, adult: Secondary | ICD-10-CM

## 2023-07-11 DIAGNOSIS — J302 Other seasonal allergic rhinitis: Secondary | ICD-10-CM | POA: Diagnosis not present

## 2023-07-11 DIAGNOSIS — E6609 Other obesity due to excess calories: Secondary | ICD-10-CM | POA: Diagnosis not present

## 2023-07-11 DIAGNOSIS — H9203 Otalgia, bilateral: Secondary | ICD-10-CM | POA: Diagnosis not present

## 2023-07-11 DIAGNOSIS — F411 Generalized anxiety disorder: Secondary | ICD-10-CM

## 2023-07-11 DIAGNOSIS — F32A Depression, unspecified: Secondary | ICD-10-CM

## 2023-07-11 DIAGNOSIS — F419 Anxiety disorder, unspecified: Secondary | ICD-10-CM

## 2023-07-11 MED ORDER — LEVOCETIRIZINE DIHYDROCHLORIDE 5 MG PO TABS
5.0000 mg | ORAL_TABLET | Freq: Every evening | ORAL | 3 refills | Status: DC
Start: 2023-07-11 — End: 2023-08-27

## 2023-07-11 MED ORDER — SERTRALINE HCL 50 MG PO TABS
50.0000 mg | ORAL_TABLET | Freq: Every day | ORAL | 0 refills | Status: DC
Start: 2023-07-11 — End: 2023-08-27

## 2023-07-11 NOTE — Progress Notes (Signed)
Madelaine Bhat, CMA,acting as a Neurosurgeon for Ellender Hose, NP.,have documented all relevant documentation on the behalf of Ellender Hose, NP,as directed by  Ellender Hose, NP while in the presence of Ellender Hose, NP.  Subjective:  Patient ID: Deanna Sparks , female    DOB: Sep 19, 1976 , 47 y.o.   MRN: [id number]  Chief Complaint  Patient presents with   Hospitalization Follow-up    HPI  Patient presents today for a hospital follow up, patient went to the urgent care on 07/05/2023 for Otalgia of both ears. Patient reports today her ears feel full.Patient reports the pain in her ears come and go. Patient reports she has pain and fullness just about everyday. Patient feels like she may be having some anxiety and depression. Patient reports she has felt this way before but this time she can not shake the feeling.  BP Readings from Last 3 Encounters: 07/11/23 : 120/84 07/05/23 : (!) 143/89 04/30/23 : (!) 150/102       Past Medical History:  Diagnosis Date   Genital herpes    last outbreak 12/2011   Hypertension    Vaginal Pap smear, abnormal      Family History  Problem Relation Age of Onset   Hypertension Mother    Healthy Father    Thyroid disease Brother    Anesthesia problems Neg Hx    Other Neg Hx      Current Outpatient Medications:    acyclovir (ZOVIRAX) 400 MG tablet, Take 1 tablet (400 mg total) by mouth 2 (two) times daily., Disp: 90 tablet, Rfl: 2   cholecalciferol (VITAMIN D3) 25 MCG (1000 UNIT) tablet, Take 1,000 Units by mouth daily., Disp: , Rfl:    fluconazole (DIFLUCAN) 100 MG tablet, Take 100 mg by mouth as directed., Disp: , Rfl:    levocetirizine (XYZAL) 5 MG tablet, Take 1 tablet (5 mg total) by mouth every evening., Disp: 30 tablet, Rfl: 3   lisinopril-hydrochlorothiazide (ZESTORETIC) 20-12.5 MG tablet, Take 1 tablet by mouth daily., Disp: 90 tablet, Rfl: 2   penicillin v potassium (VEETID) 500 MG tablet, Take 500 mg by mouth every 6 (six) hours., Disp: , Rfl:     sertraline (ZOLOFT) 50 MG tablet, Take 1 tablet (50 mg total) by mouth daily., Disp: 90 tablet, Rfl: 0   Allergies  Allergen Reactions   Codeine Itching   Ultram [Tramadol] Itching     Review of Systems  Constitutional: Negative.   HENT:  Negative for congestion, ear discharge and tinnitus.   Eyes: Negative.   Respiratory: Negative.    Cardiovascular: Negative.   Gastrointestinal: Negative.   Allergic/Immunologic: Positive for environmental allergies.  Neurological:  Positive for headaches.  Psychiatric/Behavioral:  The patient is nervous/anxious.      Today's Vitals   07/11/23 1204  BP: 120/84  Pulse: 93  Temp: 98.5 F (36.9 C)  TempSrc: Oral  Weight: 216 lb 9.6 oz (98.2 kg)  Height: 5\' 8"  (1.727 m)  PainSc: 0-No pain   Body mass index is 32.93 kg/m.  Wt Readings from Last 3 Encounters:  07/11/23 216 lb 9.6 oz (98.2 kg)  07/05/23 215 lb (97.5 kg)  04/30/23 215 lb (97.5 kg)    The 10-year ASCVD risk score (Arnett DK, et al., 2019) is: 2.2%   Values used to calculate the score:     Age: 30 years     Sex: Female     Is Non-Hispanic African American: Yes     Diabetic: No  Tobacco smoker: No     Systolic Blood Pressure: 120 mmHg     Is BP treated: Yes     HDL Cholesterol: 50 mg/dL     Total Cholesterol: 167 mg/dL  Objective:  Physical Exam HENT:     Right Ear: Tympanic membrane normal. There is no impacted cerumen.     Left Ear: Tympanic membrane normal. There is no impacted cerumen.     Nose: Congestion present.  Cardiovascular:     Rate and Rhythm: Normal rate.  Abdominal:     General: Bowel sounds are normal.  Skin:    General: Skin is warm and dry.  Neurological:     Mental Status: She is alert and oriented to person, place, and time.  Psychiatric:        Mood and Affect: Mood normal.         Assessment And Plan:  Sensation of fullness in both ears -     Ambulatory referral to Allergy  Anxiety and depression Assessment & Plan: Start  Zoloft 50mg  every day   Orders: -     Sertraline HCl; Take 1 tablet (50 mg total) by mouth daily.  Dispense: 90 tablet; Refill: 0  Seasonal allergic rhinitis, unspecified trigger -     Levocetirizine Dihydrochloride; Take 1 tablet (5 mg total) by mouth every evening.  Dispense: 30 tablet; Refill: 3  Class 1 obesity due to excess calories with body mass index (BMI) of 32.0 to 32.9 in adult, unspecified whether serious comorbidity present Assessment & Plan: She is encouraged to strive for BMI less than 30 to decrease cardiac risk. Advised to aim for at least 150 minutes of exercise per week.      Return if symptoms worsen or fail to improve, for keep  next appt. .  Patient was given opportunity to ask questions. Patient verbalized understanding of the plan and was able to repeat key elements of the plan. All questions were answered to their satisfaction.    I, Ellender Hose, NP, have reviewed all documentation for this visit. The documentation on 07/11/23 for the exam, diagnosis, procedures, and orders are all accurate and complete.    IF YOU HAVE BEEN REFERRED TO A SPECIALIST, IT MAY TAKE 1-2 WEEKS TO SCHEDULE/PROCESS THE REFERRAL. IF YOU HAVE NOT HEARD FROM US/SPECIALIST IN TWO WEEKS, PLEASE GIVE [place] A CALL AT [phone].

## 2023-07-16 DIAGNOSIS — E6609 Other obesity due to excess calories: Secondary | ICD-10-CM | POA: Insufficient documentation

## 2023-07-16 DIAGNOSIS — H938X3 Other specified disorders of ear, bilateral: Secondary | ICD-10-CM | POA: Insufficient documentation

## 2023-07-16 DIAGNOSIS — H9203 Otalgia, bilateral: Secondary | ICD-10-CM | POA: Insufficient documentation

## 2023-07-16 DIAGNOSIS — J302 Other seasonal allergic rhinitis: Secondary | ICD-10-CM | POA: Insufficient documentation

## 2023-07-16 NOTE — Assessment & Plan Note (Signed)
Start Zoloft 50mg  every day

## 2023-07-16 NOTE — Assessment & Plan Note (Signed)
She is encouraged to strive for BMI less than 30 to decrease cardiac risk. Advised to aim for at least 150 minutes of exercise per week.  

## 2023-07-23 ENCOUNTER — Encounter (HOSPITAL_COMMUNITY): Payer: Self-pay

## 2023-07-23 ENCOUNTER — Emergency Department (HOSPITAL_COMMUNITY)
Admission: EM | Admit: 2023-07-23 | Discharge: 2023-07-24 | Disposition: A | Discharge status: Home / Self Care | Payer: Medicaid Other | Attending: Emergency Medicine | Admitting: Emergency Medicine

## 2023-07-23 ENCOUNTER — Other Ambulatory Visit: Payer: Self-pay

## 2023-07-23 DIAGNOSIS — D72829 Elevated white blood cell count, unspecified: Secondary | ICD-10-CM | POA: Insufficient documentation

## 2023-07-23 DIAGNOSIS — R109 Unspecified abdominal pain: Secondary | ICD-10-CM | POA: Diagnosis present

## 2023-07-23 DIAGNOSIS — N200 Calculus of kidney: Secondary | ICD-10-CM

## 2023-07-23 DIAGNOSIS — I10 Essential (primary) hypertension: Secondary | ICD-10-CM | POA: Insufficient documentation

## 2023-07-23 DIAGNOSIS — N132 Hydronephrosis with renal and ureteral calculous obstruction: Secondary | ICD-10-CM | POA: Insufficient documentation

## 2023-07-23 DIAGNOSIS — R1031 Right lower quadrant pain: Secondary | ICD-10-CM | POA: Diagnosis not present

## 2023-07-23 DIAGNOSIS — Z79899 Other long term (current) drug therapy: Secondary | ICD-10-CM | POA: Diagnosis not present

## 2023-07-23 DIAGNOSIS — E876 Hypokalemia: Secondary | ICD-10-CM | POA: Insufficient documentation

## 2023-07-23 DIAGNOSIS — N134 Hydroureter: Secondary | ICD-10-CM | POA: Diagnosis not present

## 2023-07-23 LAB — HCG, SERUM, QUALITATIVE: Preg, Serum: NEGATIVE

## 2023-07-23 LAB — URINALYSIS, ROUTINE W REFLEX MICROSCOPIC
Bilirubin Urine: NEGATIVE
Glucose, UA: NEGATIVE mg/dL
Hgb urine dipstick: NEGATIVE
Ketones, ur: NEGATIVE mg/dL
Leukocytes,Ua: NEGATIVE
Nitrite: NEGATIVE
Protein, ur: NEGATIVE mg/dL
Specific Gravity, Urine: 1.014 (ref 1.005–1.030)
pH: 8 (ref 5.0–8.0)

## 2023-07-23 LAB — CBC
HCT: 41.3 % (ref 36.0–46.0)
Hemoglobin: 13.6 g/dL (ref 12.0–15.0)
MCH: 29 pg (ref 26.0–34.0)
MCHC: 32.9 g/dL (ref 30.0–36.0)
MCV: 88.1 fL (ref 80.0–100.0)
Platelets: 294 10*3/uL (ref 150–400)
RBC: 4.69 MIL/uL (ref 3.87–5.11)
RDW: 13.4 % (ref 11.5–15.5)
WBC: 14.7 10*3/uL — ABNORMAL HIGH (ref 4.0–10.5)
nRBC: 0 % (ref 0.0–0.2)

## 2023-07-23 MED ORDER — OXYCODONE-ACETAMINOPHEN 5-325 MG PO TABS
1.0000 | ORAL_TABLET | Freq: Once | ORAL | Status: AC
  Administered 2023-07-24: 1 via ORAL
  Filled 2023-07-23: qty 1

## 2023-07-23 MED ORDER — ONDANSETRON 4 MG PO TBDP
4.0000 mg | ORAL_TABLET | Freq: Once | ORAL | Status: AC
  Administered 2023-07-23: 4 mg via ORAL
  Filled 2023-07-23: qty 1

## 2023-07-23 NOTE — ED Triage Notes (Signed)
Pt coming in with c/o right sided abdominal and side pain. Pt states pain started about 8pm. Pt states last time she ate something was around 7pm today. Endorses n/v/d. Denies chest pain and shortness of breath. Pt states her last menstrual cycle was 07/07/23. Pt alert and oriented x4.

## 2023-07-24 ENCOUNTER — Emergency Department (HOSPITAL_COMMUNITY): Payer: Medicaid Other

## 2023-07-24 DIAGNOSIS — N132 Hydronephrosis with renal and ureteral calculous obstruction: Secondary | ICD-10-CM | POA: Diagnosis not present

## 2023-07-24 DIAGNOSIS — N134 Hydroureter: Secondary | ICD-10-CM | POA: Diagnosis not present

## 2023-07-24 DIAGNOSIS — R1031 Right lower quadrant pain: Secondary | ICD-10-CM | POA: Diagnosis not present

## 2023-07-24 LAB — COMPREHENSIVE METABOLIC PANEL
ALT: 20 U/L (ref 0–44)
AST: 20 U/L (ref 15–41)
Albumin: 3.5 g/dL (ref 3.5–5.0)
Alkaline Phosphatase: 52 U/L (ref 38–126)
Anion gap: 14 (ref 5–15)
BUN: 13 mg/dL (ref 6–20)
CO2: 24 mmol/L (ref 22–32)
Calcium: 9 mg/dL (ref 8.9–10.3)
Chloride: 100 mmol/L (ref 98–111)
Creatinine, Ser: 1.02 mg/dL — ABNORMAL HIGH (ref 0.44–1.00)
GFR, Estimated: >60 mL/min (ref >60)
Glucose, Bld: 134 mg/dL — ABNORMAL HIGH (ref 70–99)
Potassium: 3.2 mmol/L — ABNORMAL LOW (ref 3.5–5.1)
Sodium: 138 mmol/L (ref 135–145)
Total Bilirubin: 0.5 mg/dL (ref 0.3–1.2)
Total Protein: 7 g/dL (ref 6.5–8.1)

## 2023-07-24 LAB — LIPASE, BLOOD: Lipase: 33 U/L (ref 11–51)

## 2023-07-24 MED ORDER — KETOROLAC TROMETHAMINE 15 MG/ML IJ SOLN
15.0000 mg | Freq: Once | INTRAMUSCULAR | Status: AC
  Administered 2023-07-24: 15 mg via INTRAVENOUS
  Filled 2023-07-24: qty 1

## 2023-07-24 MED ORDER — IOHEXOL 350 MG/ML SOLN
75.0000 mL | Freq: Once | INTRAVENOUS | Status: AC | PRN
  Administered 2023-07-24: 75 mL via INTRAVENOUS

## 2023-07-24 MED ORDER — TAMSULOSIN HCL 0.4 MG PO CAPS: 0.4000 mg | ORAL_CAPSULE | Freq: Every day | ORAL | 0 refills | Status: AC

## 2023-07-24 MED ORDER — ONDANSETRON 4 MG PO TBDP: 4.0000 mg | ORAL_TABLET | Freq: Three times a day (TID) | ORAL | 0 refills | Status: DC | PRN

## 2023-07-24 MED ORDER — OXYCODONE-ACETAMINOPHEN 5-325 MG PO TABS: 1.0000 | ORAL_TABLET | Freq: Four times a day (QID) | ORAL | 0 refills | Status: DC | PRN

## 2023-07-24 NOTE — ED Provider Notes (Signed)
[place] [facility] AT [facility] Provider Note   CSN: [id number] Arrival date & time: 07/23/23  2117     History Chief Complaint  Patient presents with   Abdominal Pain    Deanna Sparks is a 47 y.o. female.  Patient with history significant for hypertension, obesity, and anxiety presents the emergency department concerns of abdominal pain.  Reports that she began to experience right-sided abdominal pain as well as side pain starting last night around 8 PM.  Reports that she ate around 7 PM and since then has had several episodes of nausea, vomiting, diarrhea.  Denies any chest pain shortness of breath at this time.  States that she has not been able to take anything as alleviate her symptoms up to this point.  No prior history of kidney stones.  Denies any obvious urinary symptoms such as dysuria, hematuria, or increased urinary frequency or urgency.  Currently endorses that pain is 3 out of 10.   Abdominal Pain      Home Medications Prior to Admission medications   Medication Sig Start Date End Date Taking? Authorizing Provider  ondansetron (ZOFRAN-ODT) 4 MG disintegrating tablet Take 1 tablet (4 mg total) by mouth every 8 (eight) hours as needed for nausea or vomiting. 07/24/23  Yes Smitty Knudsen, PA-C  oxyCODONE-acetaminophen (PERCOCET/ROXICET) 5-325 MG tablet Take 1 tablet by mouth every 6 (six) hours as needed for severe pain. 07/24/23  Yes Smitty Knudsen, PA-C  tamsulosin (FLOMAX) 0.4 MG CAPS capsule Take 1 capsule (0.4 mg total) by mouth daily for 15 days. 07/24/23 08/08/23 Yes Smitty Knudsen, PA-C  acyclovir (ZOVIRAX) 400 MG tablet Take 1 tablet (400 mg total) by mouth 2 (two) times daily. 05/23/23   Dorothyann Peng, MD  cholecalciferol (VITAMIN D3) 25 MCG (1000 UNIT) tablet Take 1,000 Units by mouth daily.    [provider]  fluconazole (DIFLUCAN) 100 MG tablet Take 100 mg by mouth as directed. 06/29/23   [provider]   levocetirizine (XYZAL) 5 MG tablet Take 1 tablet (5 mg total) by mouth every evening. 07/11/23   Ellender Hose, NP  lisinopril-hydrochlorothiazide (ZESTORETIC) 20-12.5 MG tablet Take 1 tablet by mouth daily. 04/24/22   Dorothyann Peng, MD  penicillin v potassium (VEETID) 500 MG tablet Take 500 mg by mouth every 6 (six) hours. 06/29/23   [provider]  sertraline (ZOLOFT) 50 MG tablet Take 1 tablet (50 mg total) by mouth daily. 07/11/23   Ellender Hose, NP      Allergies    Codeine and Ultram [tramadol]    Review of Systems   Review of Systems  Gastrointestinal:  Positive for abdominal pain.  All other systems reviewed and are negative.   Physical Exam Updated Vital Signs BP 134/83   Pulse 73   Temp 98.2 F (36.8 C) (Oral)   Resp 18   Ht 5\' 8"  (1.727 m)   Wt 97.5 kg   LMP 06/06/2023 (Approximate)   SpO2 95%   BMI 32.69 kg/m  Physical Exam Vitals and nursing note reviewed.  Constitutional:      General: She is not in acute distress.    Appearance: She is well-developed.  HENT:     Head: Normocephalic and atraumatic.  Eyes:     Conjunctiva/sclera: Conjunctivae normal.  Cardiovascular:     Rate and Rhythm: Normal rate and regular rhythm.     Heart sounds: No murmur heard. Pulmonary:     Effort: Pulmonary effort is normal. No  respiratory distress.     Breath sounds: Normal breath sounds.  Abdominal:     Palpations: Abdomen is soft.     Tenderness: There is no abdominal tenderness. There is right CVA tenderness.  Musculoskeletal:        General: No swelling.     Cervical back: Neck supple.  Skin:    General: Skin is warm and dry.     Capillary Refill: Capillary refill takes less than 2 seconds.  Neurological:     Mental Status: She is alert.  Psychiatric:        Mood and Affect: Mood normal.     ED Results / Procedures / Treatments   Labs (all labs ordered are listed, but only abnormal results are displayed) Labs Reviewed  COMPREHENSIVE METABOLIC PANEL -  Abnormal; Notable for the following components:      Result Value   Potassium 3.2 (*)    Glucose, Bld 134 (*)    Creatinine, Ser 1.02 (*)    All other components within normal limits  CBC - Abnormal; Notable for the following components:   WBC 14.7 (*)    All other components within normal limits  URINALYSIS, ROUTINE W REFLEX MICROSCOPIC - Abnormal; Notable for the following components:   APPearance CLOUDY (*)    All other components within normal limits  LIPASE, BLOOD  HCG, SERUM, QUALITATIVE    EKG None  Radiology CT ABDOMEN PELVIS W CONTRAST  Result Date: 07/24/2023 CLINICAL DATA:  Right lower quadrant pain EXAM: CT ABDOMEN AND PELVIS WITH CONTRAST TECHNIQUE: Multidetector CT imaging of the abdomen and pelvis was performed using the standard protocol following bolus administration of intravenous contrast. RADIATION DOSE REDUCTION: This exam was performed according to the departmental dose-optimization program which includes automated exposure control, adjustment of the mA and/or kV according to patient size and/or use of iterative reconstruction technique. CONTRAST:  75mL OMNIPAQUE IOHEXOL 350 MG/ML SOLN COMPARISON:  None Available. FINDINGS: Lower chest: No acute abnormality. Hepatobiliary: Fatty infiltration of the liver is noted. The gallbladder is within normal limits. Pancreas: Unremarkable. No pancreatic ductal dilatation or surrounding inflammatory changes. Spleen: Normal in size without focal abnormality. Adrenals/Urinary Tract: Adrenal glands are within normal limits. Left kidney demonstrates a normal enhancement pattern. No renal calculi are noted. Some delayed enhancement is noted on the right with evidence of hydronephrosis and hydroureter. This extends inferiorly to the ureterovesical junction where a small 2 mm stone is noted. The bladder is decompressed. Stomach/Bowel: The appendix is within normal limits. No obstructive or inflammatory changes of the colon are seen. Small  bowel and stomach are unremarkable. Vascular/Lymphatic: No significant vascular findings are present. No enlarged abdominal or pelvic lymph nodes. Reproductive: Uterus is retroflexed.  No adnexal mass is seen. Other: No abdominal wall hernia or abnormality. No abdominopelvic ascites. Musculoskeletal: No acute or significant osseous findings. IMPRESSION: 2 mm right UVJ stone with obstructive change. Fatty liver. No other focal abnormality is noted. Electronically Signed   By: Alcide Clever M.D.   On: 07/24/2023 01:19    Procedures Procedures   Medications Ordered in ED Medications  ondansetron (ZOFRAN-ODT) disintegrating tablet 4 mg (4 mg Oral Given 07/23/23 2348)  oxyCODONE-acetaminophen (PERCOCET/ROXICET) 5-325 MG per tablet 1 tablet (1 tablet Oral Given 07/24/23 0001)  iohexol (OMNIPAQUE) 350 MG/ML injection 75 mL (75 mLs Intravenous Contrast Given 07/24/23 0030)  ketorolac (TORADOL) 15 MG/ML injection 15 mg (15 mg Intravenous Given 07/24/23 0206)    ED Course/ Medical Decision Making/ A&P  Medical Decision Making Amount and/or Complexity of Data Reviewed Radiology: ordered.  Risk Prescription drug management.   This patient presents to the ED for concern of abdominal pain.  Differential diagnosis includes UTI, pyelonephritis, cholecystitis, pancreatitis, bowel obstruction   Lab Tests:  I Ordered, and personally interpreted labs.  The pertinent results include: CBC with leukocytosis of 14.7, CMP with mild hypokalemia at 3.2 likely due to GI loss, creatinine slightly elevated at 1.02 likely due to vomiting, UA without obvious signs of infection but somewhat cloudy, lipase unremarkable, hCG negative   Imaging Studies ordered:  I ordered imaging studies including CT abdomen pelvis I independently visualized and interpreted imaging which showed 2 mm right UVJ stone with evidence of obstruction, incidental finding of fatty liver I agree with the radiologist  interpretation   Medicines ordered and prescription drug management:  I ordered medication including Percocet, Toradol, Zofran for pain, nausea Reevaluation of the patient after these medicines showed that the patient improved I have reviewed the patients home medicines and have made adjustments as needed   Problem List / ED Course:  Patient presents to the emergency department concerns of right side abdominal pain with right flank pain for the last few hours.  States that she is eating send around 7 PM and then soon after began to experience nausea vomiting and diarrhea.  Denies any chest pain or shortness of breath at that time.  No prior history of kidney stones.  Will evaluate basic labs including CBC, CMP, UA, lipase and hCG.  Percocet given in triage for pain control as well as Zofran for nausea. Labs largely markable for mild leukocytosis at 14.7.  Suspect likely infectious process but could be response due to pain or vomiting.  CMP with only mild hypokalemia due to likely GI loss anemia without obvious signs of infection.  No blood noted.  On exam, patient does have notable right-sided CVA tenderness so we will proceed with CT imaging of the abdomen pelvis for evaluation of patient's current symptoms.  Lipase unremarkable and hCG negative. Pain improved to a reported 3 out of 10. CT scan imaging is positive for a 2 mm stone in the right UVJ.  Given some evidence of obstruction, will initiate expulsive therapy with tamsulosin, as well as pain control nausea control at home.  Low concern for required admission with urology follow-up this patient is not in any evidence of AKI and no evidence of infection in the urine with negative nitrites or leukocytes the white blood count elevation likely secondary to pain.  No indication for antibiotic therapy at this time.  Also provide patient with a strainer to run urine through to determine if patient is able to pass stone.  Advised following up with  primary care provider as well as urology if symptoms or not improving as patient may require further intervention with urology.  Patient agreeable with treatment plan verbalized understanding strict return precautions.  Patient discharged home in stable condition.  Final Clinical Impression(s) / ED Diagnoses Final diagnoses:  Kidney stone on right side    Rx / DC Orders ED Discharge Orders          Ordered    tamsulosin (FLOMAX) 0.4 MG CAPS capsule  Daily        07/24/23 0534    ondansetron (ZOFRAN-ODT) 4 MG disintegrating tablet  Every 8 hours PRN        07/24/23 0534    oxyCODONE-acetaminophen (PERCOCET/ROXICET) 5-325 MG tablet  Every 6 hours PRN  07/24/23 0534              Smitty Knudsen, PA-C 07/24/23 2257    Nira Conn, MD 07/28/23 1754

## 2023-07-24 NOTE — Discharge Instructions (Addendum)
You are seen in the emergency department for abdominal pain.  Your CT scan shows a kidney stone on the right side.  Given that he had improvement in symptoms with medications here, you are prescribed medications for use at home for improve urine flow including Flomax.  You are also prescribed Zofran for nausea and Percocet for pain.  If you have any acute or worsening symptoms, return to the emergency department.  Otherwise, would recommend following up with her primary care provider.

## 2023-07-24 NOTE — ED Notes (Signed)
Transported to CT 

## 2023-08-24 ENCOUNTER — Ambulatory Visit: Payer: Medicaid Other | Admitting: Allergy

## 2023-08-27 ENCOUNTER — Ambulatory Visit: Payer: Medicaid Other | Admitting: Internal Medicine

## 2023-08-27 ENCOUNTER — Encounter: Payer: Self-pay | Admitting: Internal Medicine

## 2023-08-27 VITALS — BP 132/92 | HR 89 | Temp 98.4°F | Ht 68.0 in | Wt 212.0 lb

## 2023-08-27 DIAGNOSIS — R7309 Other abnormal glucose: Secondary | ICD-10-CM | POA: Diagnosis not present

## 2023-08-27 DIAGNOSIS — Z0000 Encounter for general adult medical examination without abnormal findings: Secondary | ICD-10-CM | POA: Diagnosis not present

## 2023-08-27 DIAGNOSIS — I10 Essential (primary) hypertension: Secondary | ICD-10-CM

## 2023-08-27 DIAGNOSIS — E66811 Obesity, class 1: Secondary | ICD-10-CM

## 2023-08-27 DIAGNOSIS — E6609 Other obesity due to excess calories: Secondary | ICD-10-CM | POA: Diagnosis not present

## 2023-08-27 DIAGNOSIS — F419 Anxiety disorder, unspecified: Secondary | ICD-10-CM | POA: Diagnosis not present

## 2023-08-27 DIAGNOSIS — G4709 Other insomnia: Secondary | ICD-10-CM | POA: Diagnosis not present

## 2023-08-27 DIAGNOSIS — G43809 Other migraine, not intractable, without status migrainosus: Secondary | ICD-10-CM

## 2023-08-27 DIAGNOSIS — F32A Depression, unspecified: Secondary | ICD-10-CM

## 2023-08-27 DIAGNOSIS — Z6832 Body mass index (BMI) 32.0-32.9, adult: Secondary | ICD-10-CM | POA: Diagnosis not present

## 2023-08-27 DIAGNOSIS — F5104 Psychophysiologic insomnia: Secondary | ICD-10-CM

## 2023-08-27 LAB — POCT URINALYSIS DIPSTICK
Blood, UA: NEGATIVE
Glucose, UA: NEGATIVE
Ketones, UA: NEGATIVE
Leukocytes, UA: NEGATIVE
Nitrite, UA: NEGATIVE
Protein, UA: NEGATIVE
Spec Grav, UA: >=1.03 — AB (ref 1.010–1.025)
Urobilinogen, UA: 0.2 U/dL
pH, UA: 6 (ref 5.0–8.0)

## 2023-08-27 MED ORDER — TRAZODONE HCL 50 MG PO TABS
50.0000 mg | ORAL_TABLET | Freq: Every day | ORAL | 1 refills | Status: DC
Start: 2023-08-27 — End: 2023-12-27

## 2023-08-27 NOTE — Assessment & Plan Note (Signed)

## 2023-08-27 NOTE — Assessment & Plan Note (Signed)
Unfortunately, she did not tolerate sertraline. Sleep seems to be her primary concern today. I will send rx trazodone50mg  nightly which has worked for her in the past.

## 2023-08-27 NOTE — Progress Notes (Signed)
I,Jameka J Llittleton, CMA,acting as a Neurosurgeon for [name], MD.,have documented all relevant documentation on the behalf of [name], MD,as directed by  [name], MD while in the presence of [name], MD.  Subjective:    Patient ID: Deanna Sparks , female    DOB: 1976-02-10 , 47 y.o.   MRN: [id number]  Chief Complaint  Patient presents with   Annual Exam   Hypertension    HPI  Patient presents today for a physical. Patient reports compliance with her meds.Patient is followed by GYN for her pelvic exams. Patient complains of not being able to sleep well. Patient reported she was taking sertraline but she stopped taking it because it wasn't working.   Hypertension This is a chronic problem. The current episode started more than 1 year ago. The problem has been gradually improving since onset. The problem is controlled. Associated symptoms include headaches. Pertinent negatives include no anxiety or blurred vision. Past treatments include lifestyle changes. The current treatment provides moderate improvement.     Past Medical History:  Diagnosis Date   Genital herpes    last outbreak 12/2011   Hypertension    Vaginal Pap smear, abnormal      Family History  Problem Relation Age of Onset   Hypertension Mother    Healthy Father    Thyroid disease Brother    Anesthesia problems Neg Hx    Other Neg Hx      Current Outpatient Medications:    acyclovir (ZOVIRAX) 400 MG tablet, Take 1 tablet (400 mg total) by mouth 2 (two) times daily., Disp: 90 tablet, Rfl: 2   lisinopril-hydrochlorothiazide (ZESTORETIC) 20-12.5 MG tablet, Take 1 tablet by mouth daily., Disp: 90 tablet, Rfl: 2   ondansetron (ZOFRAN-ODT) 4 MG disintegrating tablet, Take 1 tablet (4 mg total) by mouth every 8 (eight) hours as needed for nausea or vomiting., Disp: 20 tablet, Rfl: 0   traZODone (DESYREL) 50 MG tablet, Take 1 tablet (50 mg total) by mouth at bedtime., Disp: 90 tablet, Rfl: 1    Allergies  Allergen Reactions   Codeine Itching   Ultram [Tramadol] Itching      The patient states she uses none for birth control. Patient's last menstrual period was 07/31/2023.. Negative for Dysmenorrhea. Negative for: breast discharge, breast lump(s), breast pain and breast self exam. Associated symptoms include abnormal vaginal bleeding. Pertinent negatives include abnormal bleeding (hematology), anxiety, decreased libido, depression, difficulty falling sleep, dyspareunia, history of infertility, nocturia, sexual dysfunction, sleep disturbances, urinary incontinence, urinary urgency, vaginal discharge and vaginal itching. Diet regular.The patient states her exercise level is  intermittent.  . The patient's tobacco use is:  Social History   Tobacco Use  Smoking Status Never  Smokeless Tobacco Never  . She has been exposed to passive smoke. The patient's alcohol use is:  Social History   Substance and Sexual Activity  Alcohol Use Yes   Comment: occasionally    Review of Systems  Constitutional: Negative.   HENT: Negative.    Eyes: Negative.  Negative for blurred vision.  Respiratory: Negative.    Cardiovascular: Negative.   Gastrointestinal: Negative.   Endocrine: Negative.   Genitourinary: Negative.   Musculoskeletal: Negative.   Skin: Negative.   Allergic/Immunologic: Negative.   Neurological:  Positive for headaches.  Hematological: Negative.   Psychiatric/Behavioral:  Positive for sleep disturbance. The patient is nervous/anxious.      Today's Vitals   08/27/23 0959 08/27/23 1051  BP: (!) 154/100 Marland Kitchen)  132/92  Pulse: 89   Temp: 98.4 F (36.9 C)   Weight: 212 lb (96.2 kg)   Height: 5\' 8"  (1.727 m)   PainSc: 3    PainLoc: Abdomen    Body mass index is 32.23 kg/m.  Wt Readings from Last 3 Encounters:  08/27/23 212 lb (96.2 kg)  07/23/23 215 lb (97.5 kg)  07/11/23 216 lb 9.6 oz (98.2 kg)     Objective:  Physical Exam Vitals and nursing note reviewed.   Constitutional:      Appearance: Normal appearance. She is obese.  HENT:     Head: Normocephalic and atraumatic.     Right Ear: Tympanic membrane, ear canal and external ear normal.     Left Ear: Tympanic membrane, ear canal and external ear normal.     Nose: Nose normal.     Mouth/Throat:     Mouth: Mucous membranes are moist.     Pharynx: Oropharynx is clear.  Eyes:     Extraocular Movements: Extraocular movements intact.     Conjunctiva/sclera: Conjunctivae normal.     Pupils: Pupils are equal, round, and reactive to light.  Cardiovascular:     Rate and Rhythm: Normal rate and regular rhythm.     Pulses: Normal pulses.     Heart sounds: Normal heart sounds.  Pulmonary:     Effort: Pulmonary effort is normal.     Breath sounds: Normal breath sounds.  Chest:  Breasts:    Tanner Score is 5.     Right: Normal.     Left: Normal.  Abdominal:     General: Bowel sounds are normal.     Palpations: Abdomen is soft.     Comments: Obese, soft  Genitourinary:    Comments: deferred Musculoskeletal:        General: Normal range of motion.     Cervical back: Normal range of motion and neck supple.  Skin:    General: Skin is warm and dry.  Neurological:     General: No focal deficit present.     Mental Status: She is alert and oriented to person, place, and time.  Psychiatric:        Mood and Affect: Mood normal.        Behavior: Behavior normal.         Assessment And Plan:     Encounter for general adult medical examination w/o abnormal findings Assessment & Plan: A full exam was performed.  Importance of monthly self breast exams was discussed with the patient.  She is advised to get 30-45 minutes of regular exercise, no less than four to five days per week. Both weight-bearing and aerobic exercises are recommended.  She is advised to follow a healthy diet with at least six fruits/veggies per day, decrease intake of red meat and other saturated fats and to increase fish  intake to twice weekly.  Meats/fish should not be fried -- baked, boiled or broiled is preferable. It is also important to cut back on your sugar intake.  Be sure to read labels - try to avoid anything with added sugar, high fructose corn syrup or other sweeteners.  If you must use a sweetener, you can try stevia or monkfruit.  It is also important to avoid artificially sweetened foods/beverages and diet drinks. Lastly, wear SPF 50 sunscreen on exposed skin and when in direct sunlight for an extended period of time.  Be sure to avoid fast food restaurants and aim for at least 60 ounces of  water daily.      Orders: -     CBC -     Hemoglobin A1c -     Lipid panel -     Lipoprotein A (LPA) -     BMP8+EGFR; Future  Essential hypertension Assessment & Plan: Chronic, uncontrolled.  EKG performed, NSR w/o acute changes.  Likely affected by lack of restful sleep.  She will continue with lisinopril/hct 20/12.5mg  daily for now. She agrees to rto for nurse visit.  If needed, I plan to add amlodipine nightly. Encouraged to follow low sodium diet.   Orders: -     Microalbumin / creatinine urine ratio -     POCT urinalysis dipstick -     EKG 12-Lead  Other insomnia Assessment & Plan: Chronic, I will send rx trazodone 50mg  nightly. She will rto in six weeks for re-evaluation. Reminded to develop a bedtime routine. May also benefit from magnesium glycinate nightly.    Anxiety and depression Assessment & Plan: Unfortunately, she did not tolerate sertraline. Sleep seems to be her primary concern today. I will send rx trazodone50mg  nightly which has worked for her in the past.    Other migraine without status migrainosus, not intractable Assessment & Plan: Chronic, she feels her recent headaches are due to migraines. She was given samples of Ubrelvy, 50mg  to take as needed, may repeat in 2 hours if needed. However, her recent headaches could also be due to uncontrolled HTN. Again, Mg supplementation may  be beneficial. I will reassess at her next visit.    Class 1 obesity due to excess calories with body mass index (BMI) of 32.0 to 32.9 in adult, unspecified whether serious comorbidity present Assessment & Plan: She is encouraged to strive for BMI less than 30 to decrease cardiac risk. Advised to aim for at least 150 minutes of exercise per week.    Other orders -     traZODone HCl; Take 1 tablet (50 mg total) by mouth at bedtime.  Dispense: 90 tablet; Refill: 1     Return in 2 weeks (on 09/10/2023), or BP check nurse viist (Tues), for 1 year physical, 6 month bp. Patient was given opportunity to ask questions. Patient verbalized understanding of the plan and was able to repeat key elements of the plan. All questions were answered to their satisfaction.   I, [name], MD, have reviewed all documentation for this visit. The documentation on 08/27/23 for the exam, diagnosis, procedures, and orders are all accurate and complete.

## 2023-08-27 NOTE — Assessment & Plan Note (Signed)
She is encouraged to strive for BMI less than 30 to decrease cardiac risk. Advised to aim for at least 150 minutes of exercise per week.

## 2023-08-28 LAB — LIPID PANEL
Chol/HDL Ratio: 3.2 ratio (ref 0.0–4.4)
Cholesterol, Total: 175 mg/dL (ref 100–199)
HDL: 54 mg/dL (ref >39)
LDL Chol Calc (NIH): 107 mg/dL — ABNORMAL HIGH (ref 0–99)
Triglycerides: 73 mg/dL (ref 0–149)
VLDL Cholesterol Cal: 14 mg/dL (ref 5–40)

## 2023-08-28 LAB — CBC
Hematocrit: 44.6 % (ref 34.0–46.6)
Hemoglobin: 14.6 g/dL (ref 11.1–15.9)
MCH: 29.5 pg (ref 26.6–33.0)
MCHC: 32.7 g/dL (ref 31.5–35.7)
MCV: 90 fL (ref 79–97)
Platelets: 332 10*3/uL (ref 150–450)
RBC: 4.95 x10E6/uL (ref 3.77–5.28)
RDW: 12.3 % (ref 11.7–15.4)
WBC: 9 10*3/uL (ref 3.4–10.8)

## 2023-08-28 LAB — HEMOGLOBIN A1C
Est. average glucose Bld gHb Est-mCnc: 117 mg/dL
Hgb A1c MFr Bld: 5.7 % — ABNORMAL HIGH (ref 4.8–5.6)

## 2023-08-28 LAB — MICROALBUMIN / CREATININE URINE RATIO
Creatinine, Urine: 237.4 mg/dL
Microalb/Creat Ratio: 7 mg/g{creat} (ref 0–29)
Microalbumin, Urine: 16.4 ug/mL

## 2023-08-28 LAB — LIPOPROTEIN A (LPA): Lipoprotein (a): 211.7 nmol/L — ABNORMAL HIGH (ref <75.0)

## 2023-08-29 ENCOUNTER — Encounter: Payer: Self-pay | Admitting: Internal Medicine

## 2023-09-02 DIAGNOSIS — G43909 Migraine, unspecified, not intractable, without status migrainosus: Secondary | ICD-10-CM | POA: Insufficient documentation

## 2023-09-02 NOTE — Assessment & Plan Note (Signed)
Chronic, I will send rx trazodone 50mg  nightly. She will rto in six weeks for re-evaluation. Reminded to develop a bedtime routine. May also benefit from magnesium glycinate nightly.

## 2023-09-02 NOTE — Assessment & Plan Note (Signed)
Chronic, she feels her recent headaches are due to migraines. She was given samples of Ubrelvy, 50mg  to take as needed, may repeat in 2 hours if needed. However, her recent headaches could also be due to uncontrolled HTN. Again, Mg supplementation may be beneficial. I will reassess at her next visit.

## 2023-09-02 NOTE — Assessment & Plan Note (Signed)
Chronic, uncontrolled.  EKG performed, NSR w/o acute changes.  Likely affected by lack of restful sleep.  She will continue with lisinopril/hct 20/12.5mg  daily for now. She agrees to rto for nurse visit.  If needed, I plan to add amlodipine nightly. Encouraged to follow low sodium diet.

## 2023-09-03 ENCOUNTER — Encounter: Payer: Self-pay | Admitting: Internal Medicine

## 2023-09-03 ENCOUNTER — Other Ambulatory Visit: Payer: Self-pay | Admitting: Internal Medicine

## 2023-09-04 ENCOUNTER — Other Ambulatory Visit: Payer: Self-pay

## 2023-09-04 MED ORDER — LISINOPRIL-HYDROCHLOROTHIAZIDE 20-12.5 MG PO TABS: 1.0000 | ORAL_TABLET | Freq: Every day | ORAL | 2 refills | Status: DC

## 2023-09-11 ENCOUNTER — Ambulatory Visit: Payer: Medicaid Other

## 2023-09-11 VITALS — BP 110/82 | HR 91 | Temp 98.1°F | Ht 68.0 in | Wt 212.0 lb

## 2023-09-11 DIAGNOSIS — I10 Essential (primary) hypertension: Secondary | ICD-10-CM

## 2023-09-11 NOTE — Progress Notes (Signed)
Patient presents today for bpc. She currently takes lisinopril-hydrochlorothiazide 20-12.5. she reports taking in the morning.  BP Readings from Last 3 Encounters:  09/11/23 110/82  08/27/23 (!) 132/92  07/24/23 134/83  Per provider patient is to continue with current regimen. She is scheduled to follow up in March. Patient aware.

## 2023-09-14 DIAGNOSIS — G5603 Carpal tunnel syndrome, bilateral upper limbs: Secondary | ICD-10-CM | POA: Diagnosis not present

## 2023-09-18 IMAGING — MG MM DIGITAL SCREENING BILAT W/ TOMO AND CAD
8 series · 8 of 24 positions shown · non-contrast
Comparison: Previous exam(s).

CLINICAL DATA: Screening.

EXAM:
DIGITAL SCREENING BILATERAL MAMMOGRAM WITH TOMOSYNTHESIS AND CAD
TECHNIQUE: Bilateral screening digital craniocaudal and mediolateral oblique
mammograms were obtained. Bilateral screening digital breast
tomosynthesis was performed. The images were evaluated with
computer-aided detection.

[L MLO synth-2D]
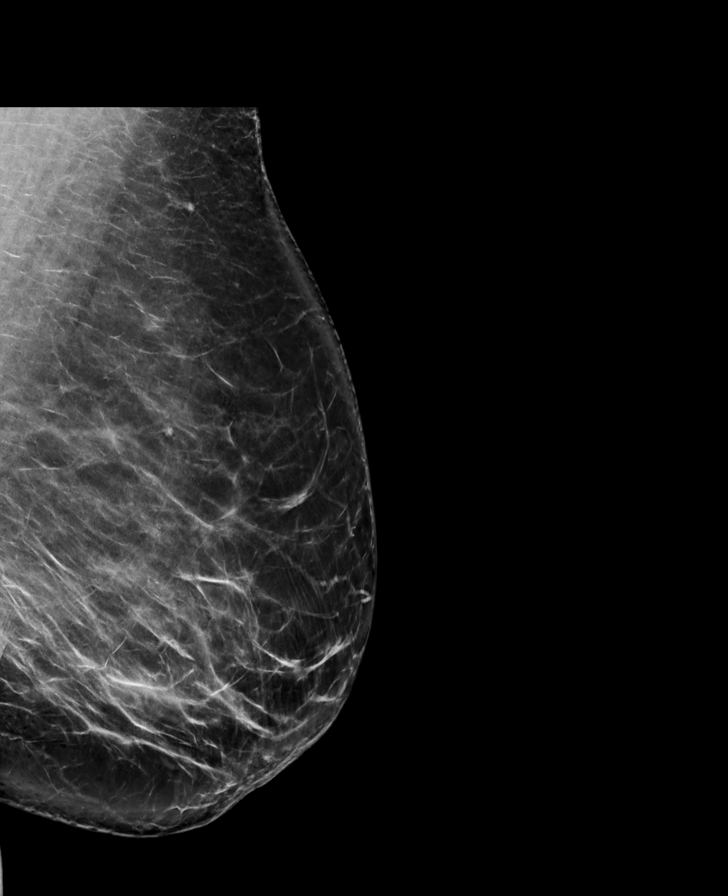

[L CC synth-2D]
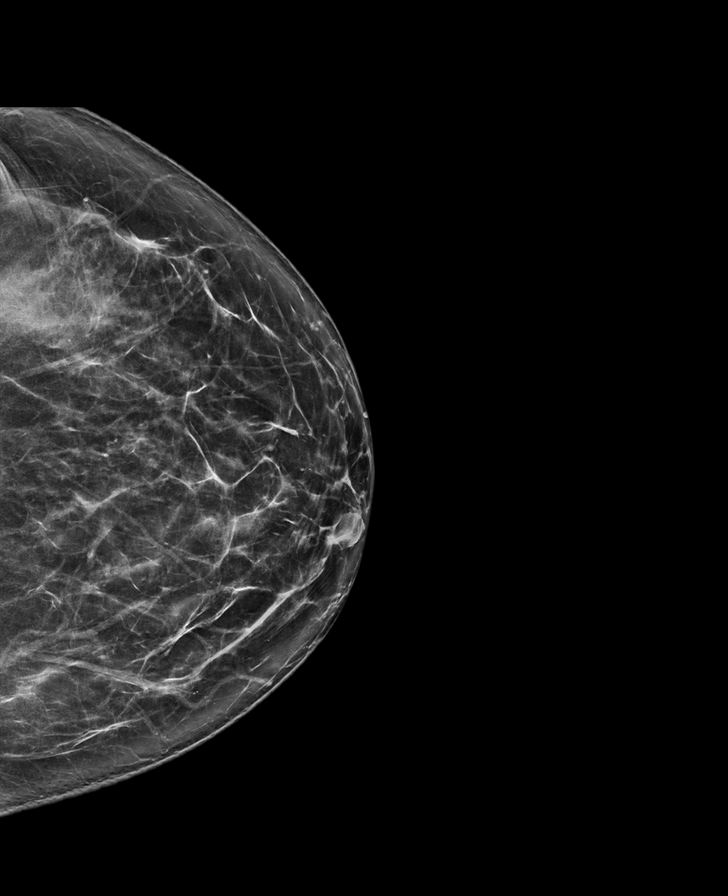

[R MLO synth-2D]
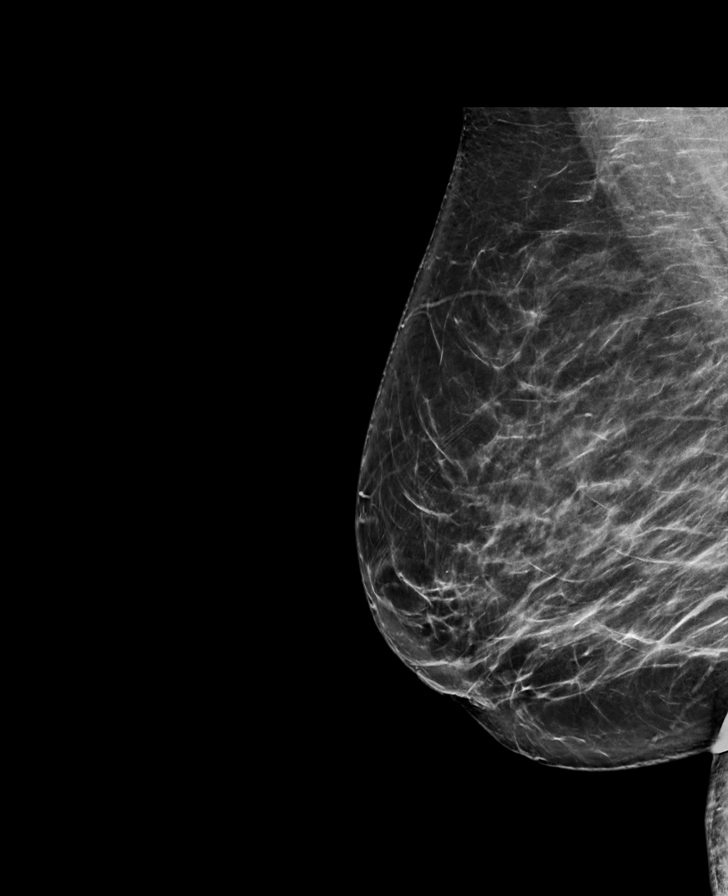

[R CC synth-2D]
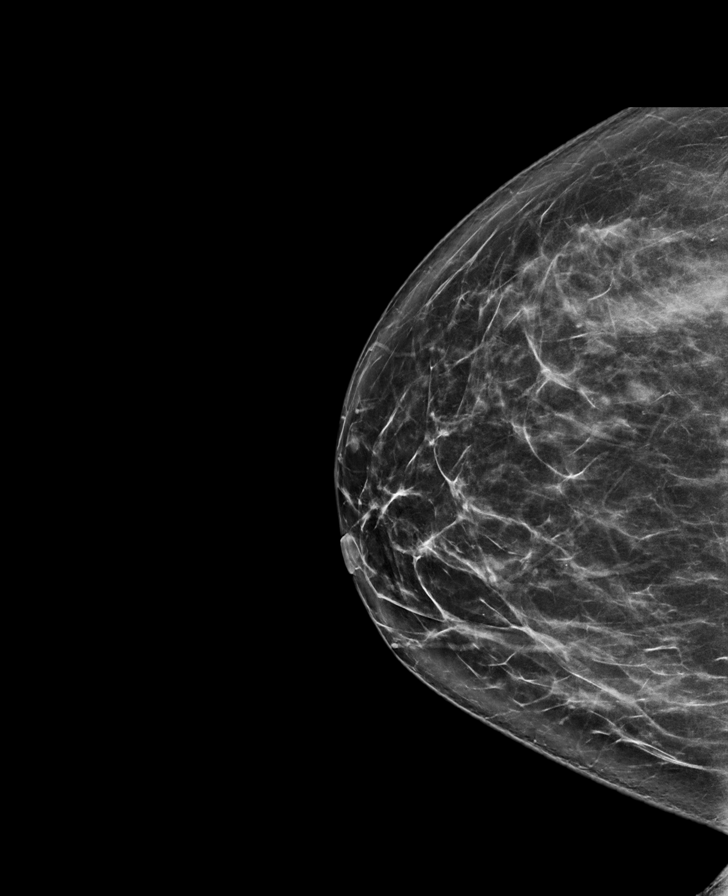

[L CC tomo · tomo slice 41/81.0]
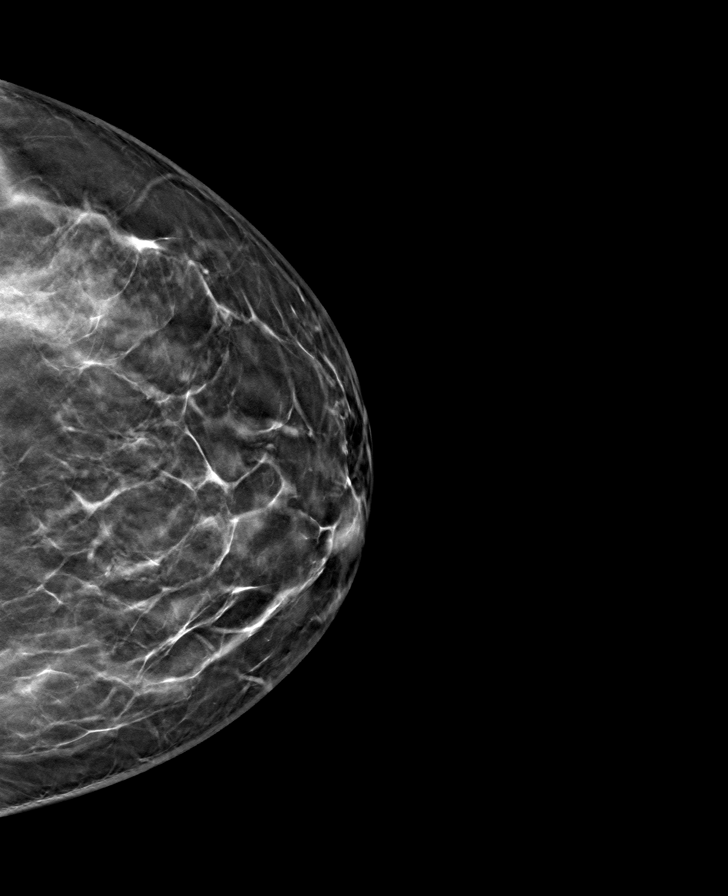

[R MLO tomo · tomo slice 46/91.0]
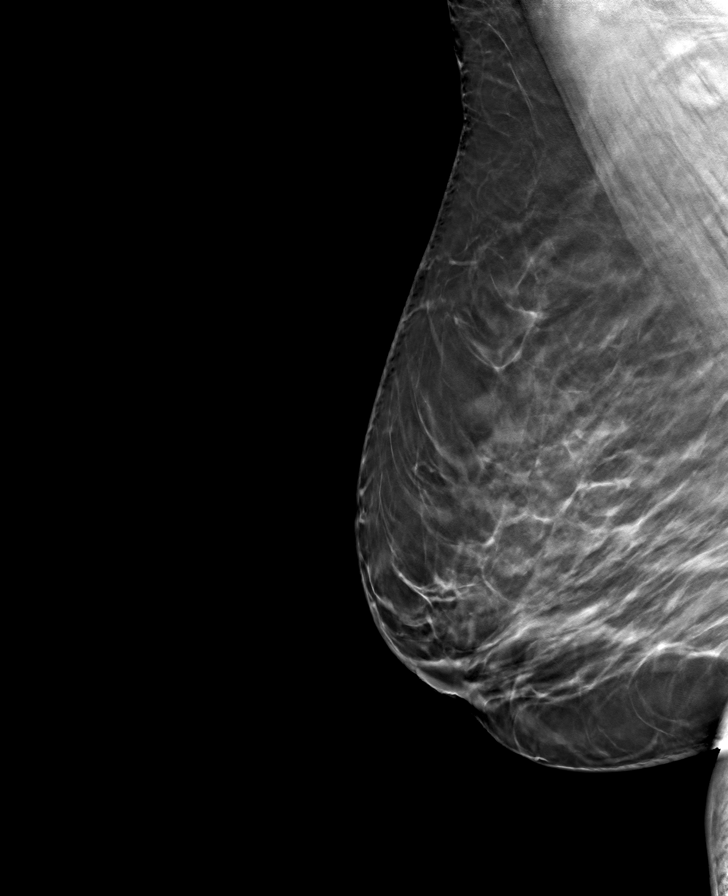

[L MLO tomo · tomo slice 51/102.0]
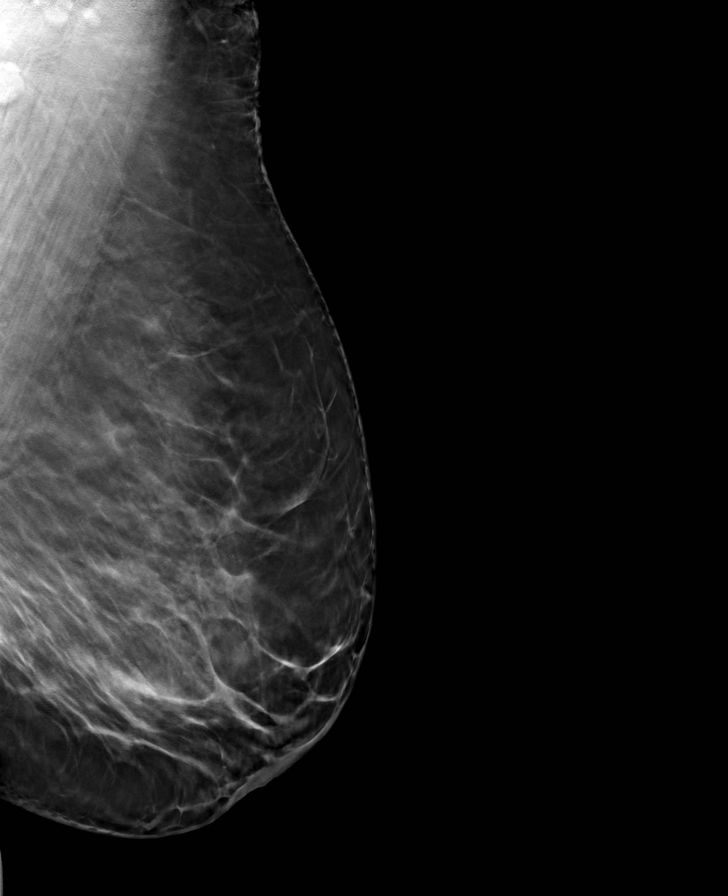

[R CC tomo · tomo slice 42/83.0]
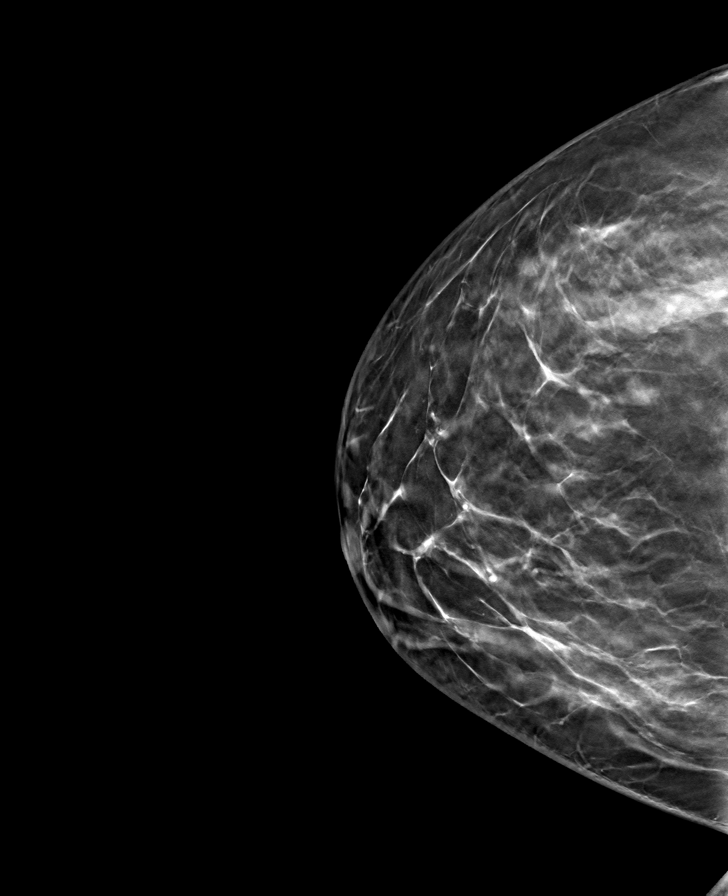

[8 of 24 positions shown; findings below may reference images not displayed]

ACR Breast Density Category b: There are scattered areas of
fibroglandular density.
FINDINGS: There are no findings suspicious for malignancy.
IMPRESSION: No mammographic evidence of malignancy. A result letter of this
screening mammogram will be mailed directly to the patient.

RECOMMENDATION:
Screening mammogram in one year. (Code:51-O-LD2)

BI-RADS CATEGORY  1: Negative.

## 2023-09-26 ENCOUNTER — Other Ambulatory Visit: Payer: Self-pay | Admitting: Internal Medicine

## 2023-09-26 DIAGNOSIS — E7841 Elevated Lipoprotein(a): Secondary | ICD-10-CM

## 2023-09-27 ENCOUNTER — Encounter (HOSPITAL_COMMUNITY): Payer: Self-pay

## 2023-09-27 ENCOUNTER — Ambulatory Visit (HOSPITAL_COMMUNITY)
Admission: RE | Admit: 2023-09-27 | Discharge: 2023-09-27 | Disposition: A | Discharge status: Home / Self Care | Payer: Medicaid Other | Source: Ambulatory Visit | Attending: Emergency Medicine | Admitting: Emergency Medicine

## 2023-09-27 VITALS — BP 134/89 | HR 83 | Temp 98.5°F | Resp 20 | Ht 68.5 in | Wt 215.0 lb

## 2023-09-27 DIAGNOSIS — J019 Acute sinusitis, unspecified: Secondary | ICD-10-CM

## 2023-09-27 DIAGNOSIS — B9689 Other specified bacterial agents as the cause of diseases classified elsewhere: Secondary | ICD-10-CM | POA: Diagnosis not present

## 2023-09-27 MED ORDER — FLUCONAZOLE 150 MG PO TABS: 150.0000 mg | ORAL_TABLET | Freq: Once | ORAL | 0 refills | Status: DC | PRN

## 2023-09-27 MED ORDER — DOXYCYCLINE HYCLATE 100 MG PO CAPS: 100.0000 mg | ORAL_CAPSULE | Freq: Two times a day (BID) | ORAL | 0 refills | Status: AC

## 2023-09-27 NOTE — ED Provider Notes (Signed)
[facility]    CSN: [id number] Arrival date & time: 09/27/23  4010      History   Chief Complaint Chief Complaint  Patient presents with   Cough   Appointment    HPI Deanna Sparks is a 47 y.o. female.  1 week history of nasal congestion, head pressure, runny nose, somewhat productive cough. Symptoms worsening over last 2 days Denies fever Partner was sick a week ago Has been using mucinex, theraflu, alkaseltzer without relief  Past Medical History:  Diagnosis Date   Genital herpes    last outbreak 12/2011   Hypertension    Vaginal Pap smear, abnormal     Patient Active Problem List   Diagnosis Date Noted   Migraine 09/02/2023   Otalgia of both ears 07/16/2023   Sensation of fullness in both ears 07/16/2023   Seasonal allergic rhinitis 07/16/2023   Class 1 obesity due to excess calories with body mass index (BMI) of 32.0 to 32.9 in adult 07/16/2023   ASCUS with positive high risk HPV cervical 08/24/2021   Encounter for general adult medical examination w/o abnormal findings 08/24/2021   Anxiety and depression 06/22/2021   OSA on CPAP 06/22/2021   Sleeps in sitting position due to orthopnea 03/14/2021   Loud snoring 03/14/2021   Insufficient sleep syndrome 03/14/2021   Nocturia more than twice per night 03/14/2021   Caregiver stress syndrome 03/14/2021   Other insomnia 03/14/2021   Sleep apnea with cognitive complaints 03/14/2021   Multinodular goiter (nontoxic) 09/24/2020   GAD (generalized anxiety disorder) 05/29/2019   HSV infection 05/29/2019   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   Vitamin D deficiency 05/28/2019   Essential hypertension 01/21/2019   Goiter 01/21/2019   Postnasal drip 01/21/2019   Plantar fasciitis 10/10/2018   Carpal tunnel syndrome 10/22/2015   Carpal tunnel syndrome, bilateral 10/22/2015   Preterm labor 04/03/2012    Past Surgical History:  Procedure Laterality Date   CESAREAN SECTION     COLPOSCOPY  10/03/2021   LIPOMA  EXCISION  2008   removed from forehead    OB History     Gravida  3   Para  3   Term  2   Preterm  1   AB  0   Living  3      SAB  0   IAB  0   Ectopic  0   Multiple  0   Live Births  1            Home Medications    Prior to Admission medications   Medication Sig Start Date End Date Taking? Authorizing Provider  acyclovir (ZOVIRAX) 400 MG tablet Take 1 tablet (400 mg total) by mouth 2 (two) times daily. 05/23/23  Yes Dorothyann Peng, MD  doxycycline (VIBRAMYCIN) 100 MG capsule Take 1 capsule (100 mg total) by mouth 2 (two) times daily for 5 days. 09/27/23 10/02/23 Yes Reeve Turnley, Lurena Joiner, PA-C  fluconazole (DIFLUCAN) 150 MG tablet Take 1 tablet (150 mg total) by mouth once as needed for up to 2 doses (take one pill on day 1, and the second pill 3 days later). 09/27/23  Yes Verlena Marlette, Lurena Joiner, PA-C  lisinopril-hydrochlorothiazide (ZESTORETIC) 20-12.5 MG tablet Take 1 tablet by mouth daily. 09/04/23  Yes Dorothyann Peng, MD  traZODone (DESYREL) 50 MG tablet Take 1 tablet (50 mg total) by mouth at bedtime. 08/27/23  Yes Dorothyann Peng, MD    Family History Family History  Problem Relation Age of Onset   Hypertension Mother  Healthy Father    Thyroid disease Brother    Anesthesia problems Neg Hx    Other Neg Hx     Social History Social History   Tobacco Use   Smoking status: Never   Smokeless tobacco: Never  Vaping Use   Vaping status: Never Used  Substance Use Topics   Alcohol use: Yes    Comment: occasionally   Drug use: Never     Allergies   Codeine and Ultram [tramadol]   Review of Systems Review of Systems Per HPI  Physical Exam Triage Vital Signs ED Triage Vitals  Encounter Vitals Group     BP 09/27/23 0930 134/89     Systolic BP Percentile --      Diastolic BP Percentile --      Pulse Rate 09/27/23 0930 83     Resp 09/27/23 0930 20     Temp 09/27/23 0930 98.5 F (36.9 C)     Temp Source 09/27/23 0930 Oral     SpO2 09/27/23 0930 96  %     Weight 09/27/23 0930 215 lb (97.5 kg)     Height 09/27/23 0930 5' 8.5" (1.74 m)     Head Circumference --      Peak Flow --      Pain Score 09/27/23 0929 5     Pain Loc --      Pain Education --      Exclude from Growth Chart --    No data found.  Updated Vital Signs BP 134/89 (BP Location: Left Arm)   Pulse 83   Temp 98.5 F (36.9 C) (Oral)   Resp 20   Ht 5' 8.5" (1.74 m)   Wt 215 lb (97.5 kg)   LMP 09/24/2023 (Exact Date)   SpO2 96%   BMI 32.22 kg/m    Physical Exam Vitals and nursing note reviewed.  Constitutional:      General: She is not in acute distress.    Appearance: She is ill-appearing.  HENT:     Right Ear: Ear canal normal.     Left Ear: Ear canal normal.     Ears:     Comments: Full appearing TMs    Nose: Congestion present. No rhinorrhea.     Right Turbinates: Swollen.     Left Turbinates: Swollen.     Right Sinus: Maxillary sinus tenderness present.     Mouth/Throat:     Mouth: Mucous membranes are moist.     Pharynx: Oropharynx is clear. No posterior oropharyngeal erythema.  Eyes:     Conjunctiva/sclera: Conjunctivae normal.  Cardiovascular:     Rate and Rhythm: Normal rate and regular rhythm.     Pulses: Normal pulses.     Heart sounds: Normal heart sounds.  Pulmonary:     Effort: Pulmonary effort is normal.     Breath sounds: Normal breath sounds. No wheezing or rales.  Musculoskeletal:     Cervical back: Normal range of motion.  Lymphadenopathy:     Cervical: No cervical adenopathy.  Skin:    General: Skin is warm and dry.  Neurological:     Mental Status: She is alert and oriented to person, place, and time.      UC Treatments / Results  Labs (all labs ordered are listed, but only abnormal results are displayed) Labs Reviewed - No data to display  EKG   Radiology No results found.  Procedures Procedures (including critical care time)  Medications Ordered in UC Medications - No data  to display  Initial  Impression / Assessment and Plan / UC Course  I have reviewed the triage vital signs and the nursing notes.  Pertinent labs & imaging results that were available during my care of the patient were reviewed by me and considered in my medical decision making (see chart for details).  With 1 week duration, worsening symptoms, and several OTC medications without relief, patient warrants antibiotic at this time. Will cover for sinobronchial etiology with doxycycline BID x 5. Patient reports history of abx associated yeast infection, will send fluconazole Return precautions discussed. Patient agrees to plan  Final Clinical Impressions(s) / UC Diagnoses   Final diagnoses:  Acute bacterial sinusitis     Discharge Instructions      Please take medication as prescribed. Take with food to avoid upset stomach. Finish the full course Drink lots of fluids!  I have also sent the fluconazole tablets to prevent yeast infection  If symptoms persist despite antibiotic, please return      ED Prescriptions     Medication Sig Dispense Auth. Provider   fluconazole (DIFLUCAN) 150 MG tablet Take 1 tablet (150 mg total) by mouth once as needed for up to 2 doses (take one pill on day 1, and the second pill 3 days later). 2 tablet Sapphira Harjo, PA-C   doxycycline (VIBRAMYCIN) 100 MG capsule Take 1 capsule (100 mg total) by mouth 2 (two) times daily for 5 days. 10 capsule Lamone Ferrelli, Lurena Joiner, PA-C      PDMP not reviewed this encounter.   Orie Baxendale, Lurena Joiner, PA-C 09/27/23 1000

## 2023-09-27 NOTE — Discharge Instructions (Signed)
Please take medication as prescribed. Take with food to avoid upset stomach. Finish the full course Drink lots of fluids!  I have also sent the fluconazole tablets to prevent yeast infection  If symptoms persist despite antibiotic, please return

## 2023-09-27 NOTE — ED Triage Notes (Signed)
"  Head congestion running stuffy nose cough fatigue - Entered by patient"  Onset 6 days ago. Cough is sometime productive. No fever. No known sick exposure.   Patient has tried Mucinex, Theraflu, Alka seltzer, throat lozenges, and aspirin with little relief.

## 2023-10-04 ENCOUNTER — Ambulatory Visit (HOSPITAL_COMMUNITY): Payer: Medicaid Other

## 2023-10-25 ENCOUNTER — Other Ambulatory Visit (HOSPITAL_COMMUNITY): Payer: Medicaid Other

## 2023-11-13 ENCOUNTER — Other Ambulatory Visit: Payer: Self-pay | Admitting: Internal Medicine

## 2023-11-13 ENCOUNTER — Encounter: Payer: Self-pay | Admitting: Internal Medicine

## 2023-11-13 MED ORDER — ESCITALOPRAM OXALATE 10 MG PO TABS: 10.0000 mg | ORAL_TABLET | Freq: Every day | ORAL | 2 refills | Status: DC

## 2023-11-15 ENCOUNTER — Other Ambulatory Visit (HOSPITAL_COMMUNITY): Payer: Medicaid Other

## 2023-11-15 ENCOUNTER — Encounter (HOSPITAL_COMMUNITY): Payer: Self-pay

## 2023-11-27 ENCOUNTER — Encounter: Payer: Self-pay | Admitting: Internal Medicine

## 2023-12-10 ENCOUNTER — Encounter: Payer: Self-pay | Admitting: Internal Medicine

## 2023-12-11 ENCOUNTER — Ambulatory Visit: Payer: Medicaid Other | Admitting: Internal Medicine

## 2023-12-12 ENCOUNTER — Ambulatory Visit (HOSPITAL_COMMUNITY): Payer: Self-pay

## 2023-12-13 ENCOUNTER — Ambulatory Visit (HOSPITAL_COMMUNITY)
Admission: EM | Admit: 2023-12-13 | Discharge: 2023-12-13 | Disposition: A | Discharge status: Home / Self Care | Payer: Medicaid Other | Attending: Family Medicine | Admitting: Family Medicine

## 2023-12-13 ENCOUNTER — Encounter (HOSPITAL_COMMUNITY): Payer: Self-pay

## 2023-12-13 DIAGNOSIS — A084 Viral intestinal infection, unspecified: Secondary | ICD-10-CM | POA: Diagnosis not present

## 2023-12-13 LAB — POC COVID19/FLU A&B COMBO
Covid Antigen, POC: NEGATIVE
Influenza A Antigen, POC: NEGATIVE
Influenza B Antigen, POC: NEGATIVE

## 2023-12-13 NOTE — ED Triage Notes (Signed)
 Pt states on Monday she started having a headache. Then on Tuesday she started having diarrhea, vomiting, loss of appetite, nausea, positional dizziness, weakness, fatigue, and chills.   Home Interventions: OTC Nausea and Diarrhea Medication

## 2023-12-13 NOTE — ED Provider Notes (Signed)
 [facility]    CSN: [id number] Arrival date & time: 12/13/23  1007      History   Chief Complaint Chief Complaint  Patient presents with   Headache   Nausea    HPI Deanna Sparks is a 48 y.o. female.   The patient presents with 3 days of headache, followed by body aches, fatigue, and diarrhea with some intermittent nausea and vomiting.  She has been eating and drinking lots of fluids. She denies any fever, chills, rashes, dizziness, light headedness, vision changes, chest pain, cough, shortness of breath, ABD pain, jaundice, dysuria, blood in the urine and no blood or mucus in the stool.   The history is provided by the patient.  Headache Associated symptoms: diarrhea, fatigue, nausea and vomiting   Associated symptoms: no abdominal pain, no back pain, no congestion, no cough, no dizziness, no drainage, no fever, no myalgias, no neck stiffness, no numbness, no photophobia, no sinus pressure, no sore throat and no weakness     Past Medical History:  Diagnosis Date   Genital herpes    last outbreak 12/2011   Hypertension    Vaginal Pap smear, abnormal     Patient Active Problem List   Diagnosis Date Noted   Migraine 09/02/2023   Otalgia of both ears 07/16/2023   Sensation of fullness in both ears 07/16/2023   Seasonal allergic rhinitis 07/16/2023   Class 1 obesity due to excess calories with body mass index (BMI) of 32.0 to 32.9 in adult 07/16/2023   ASCUS with positive high risk HPV cervical 08/24/2021   Encounter for general adult medical examination w/o abnormal findings 08/24/2021   Anxiety and depression 06/22/2021   OSA on CPAP 06/22/2021   Sleeps in sitting position due to orthopnea 03/14/2021   Loud snoring 03/14/2021   Insufficient sleep syndrome 03/14/2021   Nocturia more than twice per night 03/14/2021   Caregiver stress syndrome 03/14/2021   Other insomnia 03/14/2021   Sleep apnea with cognitive complaints 03/14/2021   Multinodular goiter  (nontoxic) 09/24/2020   GAD (generalized anxiety disorder) 05/29/2019   HSV infection 05/29/2019   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   Vitamin D deficiency 05/28/2019   Essential hypertension 01/21/2019   Goiter 01/21/2019   Postnasal drip 01/21/2019   Plantar fasciitis 10/10/2018   Carpal tunnel syndrome 10/22/2015   Carpal tunnel syndrome, bilateral 10/22/2015   Preterm labor 04/03/2012    Past Surgical History:  Procedure Laterality Date   CESAREAN SECTION     COLPOSCOPY  10/03/2021   LIPOMA EXCISION  2008   removed from forehead    OB History     Gravida  3   Para  3   Term  2   Preterm  1   AB  0   Living  3      SAB  0   IAB  0   Ectopic  0   Multiple  0   Live Births  1            Home Medications    Prior to Admission medications   Medication Sig Start Date End Date Taking? Authorizing Provider  lisinopril-hydrochlorothiazide (ZESTORETIC) 20-12.5 MG tablet Take 1 tablet by mouth daily. 09/04/23  Yes Dorothyann Peng, MD  acyclovir (ZOVIRAX) 400 MG tablet Take 1 tablet (400 mg total) by mouth 2 (two) times daily. 05/23/23   Dorothyann Peng, MD  escitalopram (LEXAPRO) 10 MG tablet Take 1 tablet (10 mg total) by mouth daily. 11/13/23 11/12/24  Allyne Gee,  Melina Schools, MD  fluconazole (DIFLUCAN) 150 MG tablet Take 1 tablet (150 mg total) by mouth once as needed for up to 2 doses (take one pill on day 1, and the second pill 3 days later). 09/27/23   Rising, Lurena Joiner, PA-C  traZODone (DESYREL) 50 MG tablet Take 1 tablet (50 mg total) by mouth at bedtime. 08/27/23   Dorothyann Peng, MD    Family History Family History  Problem Relation Age of Onset   Hypertension Mother    Healthy Father    Thyroid disease Brother    Anesthesia problems Neg Hx    Other Neg Hx     Social History Social History   Tobacco Use   Smoking status: Never   Smokeless tobacco: Never  Vaping Use   Vaping status: Never Used  Substance Use Topics   Alcohol use: Yes    Comment: occasionally    Drug use: Never     Allergies   Codeine and Ultram [tramadol]   Review of Systems Review of Systems  Constitutional:  Positive for appetite change, chills and fatigue. Negative for activity change, diaphoresis and fever.  HENT:  Negative for congestion, postnasal drip, rhinorrhea, sinus pressure, sinus pain, sore throat and trouble swallowing.   Eyes:  Negative for photophobia and visual disturbance.  Respiratory:  Negative for cough, shortness of breath and wheezing.   Cardiovascular:  Negative for chest pain and palpitations.  Gastrointestinal:  Positive for diarrhea, nausea and vomiting. Negative for abdominal distention, abdominal pain and blood in stool.  Genitourinary:  Negative for decreased urine volume, dysuria and hematuria.  Musculoskeletal:  Negative for back pain, joint swelling, myalgias and neck stiffness.  Skin:  Negative for color change and rash.  Neurological:  Positive for headaches. Negative for dizziness, syncope, speech difficulty, weakness, light-headedness and numbness.  Psychiatric/Behavioral:  Negative for confusion.      Physical Exam Triage Vital Signs ED Triage Vitals  Encounter Vitals Group     BP 12/13/23 1032 126/88     Systolic BP Percentile --      Diastolic BP Percentile --      Pulse Rate 12/13/23 1032 72     Resp 12/13/23 1032 18     Temp 12/13/23 1032 98.1 F (36.7 C)     Temp Source 12/13/23 1032 Oral     SpO2 12/13/23 1032 97 %     Weight --      Height --      Head Circumference --      Peak Flow --      Pain Score 12/13/23 1029 4     Pain Loc --      Pain Education --      Exclude from Growth Chart --    No data found.  Updated Vital Signs BP 126/88 (BP Location: Left Arm)   Pulse 72   Temp 98.1 F (36.7 C) (Oral)   Resp 18   LMP 12/08/2023 (Approximate)   SpO2 97%   Visual Acuity Right Eye Distance:   Left Eye Distance:   Bilateral Distance:    Right Eye Near:   Left Eye Near:    Bilateral Near:      Physical Exam Vitals reviewed.  Constitutional:      General: She is not in acute distress.    Appearance: She is well-developed. She is ill-appearing. She is not toxic-appearing or diaphoretic.  HENT:     Mouth/Throat:     Mouth: Mucous membranes are moist.  Pharynx: Oropharynx is clear.  Eyes:     General: No scleral icterus.    Extraocular Movements: Extraocular movements intact.     Pupils: Pupils are equal, round, and reactive to light.  Cardiovascular:     Rate and Rhythm: Normal rate and regular rhythm.     Heart sounds: No murmur heard.    No friction rub. No gallop.  Pulmonary:     Effort: Pulmonary effort is normal. No respiratory distress.     Breath sounds: Normal breath sounds. No wheezing or rales.  Abdominal:     General: There is no distension.     Palpations: Abdomen is soft. There is no mass.     Tenderness: There is no abdominal tenderness. There is no guarding.     Comments: No CVA tenderness  Musculoskeletal:     Cervical back: Normal range of motion and neck supple.  Skin:    General: Skin is warm.     Capillary Refill: Capillary refill takes 2 to 3 seconds.     Coloration: Skin is not cyanotic or pale.     Findings: No rash.  Neurological:     Mental Status: She is alert and oriented to person, place, and time.      UC Treatments / Results  Labs (all labs ordered are listed, but only abnormal results are displayed) Labs Reviewed  POC COVID19/FLU A&B COMBO    EKG   Radiology No results found.  Procedures Procedures (including critical care time)  Medications Ordered in UC Medications - No data to display  Initial Impression / Assessment and Plan / UC Course  I have reviewed the triage vital signs and the nursing notes.  Pertinent labs & imaging results that were available during my care of the patient were reviewed by me and considered in my medical decision making (see chart for details).     Viral Gastroenteritis  - The  patient has had some mild fatigue but overall has been able to maintain oral intake. Clinically stable. No red flag sxs or concern for bacterial infection.  - Rapid COVID/Influenza A/B negative - I discussed the need for good oral hydration and monitor for blood or mucus, fevers, chills, abdominal pain or other signs of serious infection.  - She can take tylenol or ibuprofen for headache. She declines antiemetics.  - The patient voiced understanding and agreement with the plan.   Final Clinical Impressions(s) / UC Diagnoses   Final diagnoses:  Viral gastroenteritis     Discharge Instructions      Make sure to continue good intake of water with electrolyte drinks and regular food intake. Avoid the use of antidiarrheals Ensure to get proper rest and avoid going into public areas to decrease the spread. Make sure to practice good handwashing hygiene and clean surfaces at home.     ED Prescriptions   None    PDMP not reviewed this encounter.   Ivor Messier, MD 12/13/23 [phone]

## 2023-12-13 NOTE — Discharge Instructions (Addendum)
 Make sure to continue good intake of water with electrolyte drinks and regular food intake. Avoid the use of antidiarrheals Ensure to get proper rest and avoid going into public areas to decrease the spread. Make sure to practice good handwashing hygiene and clean surfaces at home.

## 2023-12-19 DIAGNOSIS — G5603 Carpal tunnel syndrome, bilateral upper limbs: Secondary | ICD-10-CM | POA: Diagnosis not present

## 2023-12-21 ENCOUNTER — Other Ambulatory Visit (HOSPITAL_COMMUNITY): Payer: Medicaid Other

## 2023-12-27 ENCOUNTER — Encounter: Payer: Self-pay | Admitting: Internal Medicine

## 2023-12-27 ENCOUNTER — Ambulatory Visit: Payer: Medicaid Other | Admitting: Internal Medicine

## 2023-12-27 VITALS — BP 136/84 | HR 87 | Temp 98.9°F | Ht 68.0 in | Wt 215.8 lb

## 2023-12-27 DIAGNOSIS — E6609 Other obesity due to excess calories: Secondary | ICD-10-CM | POA: Diagnosis not present

## 2023-12-27 DIAGNOSIS — Z6832 Body mass index (BMI) 32.0-32.9, adult: Secondary | ICD-10-CM | POA: Diagnosis not present

## 2023-12-27 DIAGNOSIS — I10 Essential (primary) hypertension: Secondary | ICD-10-CM

## 2023-12-27 DIAGNOSIS — F419 Anxiety disorder, unspecified: Secondary | ICD-10-CM

## 2023-12-27 DIAGNOSIS — E66811 Obesity, class 1: Secondary | ICD-10-CM

## 2023-12-27 DIAGNOSIS — F32A Depression, unspecified: Secondary | ICD-10-CM

## 2023-12-27 DIAGNOSIS — R7303 Prediabetes: Secondary | ICD-10-CM

## 2023-12-27 DIAGNOSIS — R7309 Other abnormal glucose: Secondary | ICD-10-CM | POA: Diagnosis not present

## 2023-12-27 MED ORDER — ESCITALOPRAM OXALATE 10 MG PO TABS: 10.0000 mg | ORAL_TABLET | Freq: Every day | ORAL | 2 refills | Status: DC

## 2023-12-27 NOTE — Progress Notes (Signed)
 I,Victoria T Deloria Lair, CMA,acting as a Neurosurgeon for [name], MD.,have documented all relevant documentation on the behalf of [name], MD,as directed by  [name], MD while in the presence of [name], MD.  Subjective:  Patient ID: Deanna Sparks , female    DOB: 03/19/1976 , 48 y.o.   MRN: [id number]  Chief Complaint  Patient presents with   Hypertension   Prediabetes    HPI  Patient presents today for bp & prediabetes follow up. She reports compliance with medications. Denies headache, chest pain & sob.   Hypertension This is a chronic problem. The current episode started more than 1 year ago. The problem has been gradually improving since onset. The problem is controlled. Pertinent negatives include no anxiety, blurred vision, palpitations or shortness of breath. Past treatments include ACE inhibitors and diuretics.     Past Medical History:  Diagnosis Date   Genital herpes    last outbreak 12/2011   Hypertension    Vaginal Pap smear, abnormal      Family History  Problem Relation Age of Onset   Hypertension Mother    Healthy Father    Thyroid disease Brother    Anesthesia problems Neg Hx    Other Neg Hx      Current Outpatient Medications:    acyclovir (ZOVIRAX) 400 MG tablet, Take 1 tablet (400 mg total) by mouth 2 (two) times daily., Disp: 90 tablet, Rfl: 2   diazepam (VALIUM) 10 MG tablet, Take 10 mg by mouth daily as needed., Disp: , Rfl:    lisinopril-hydrochlorothiazide (ZESTORETIC) 20-12.5 MG tablet, Take 1 tablet by mouth daily., Disp: 90 tablet, Rfl: 2   escitalopram (LEXAPRO) 10 MG tablet, Take 1 tablet (10 mg total) by mouth daily., Disp: 90 tablet, Rfl: 2   fluconazole (DIFLUCAN) 150 MG tablet, Take 1 tablet (150 mg total) by mouth once as needed for up to 2 doses (take one pill on day 1, and the second pill 3 days later). (Patient not taking: Reported on 12/27/2023), Disp: 2 tablet, Rfl: 0   Allergies  Allergen Reactions    Codeine Itching   Ultram [Tramadol] Itching     Review of Systems  Constitutional: Negative.   Eyes:  Negative for blurred vision.  Respiratory: Negative.  Negative for shortness of breath.   Cardiovascular: Negative.  Negative for palpitations.  Gastrointestinal: Negative.   Neurological: Negative.   Psychiatric/Behavioral: Negative.       Today's Vitals   12/27/23 1600  BP: 136/84  Pulse: 87  Temp: 98.9 F (37.2 C)  SpO2: 98%  Weight: 215 lb 12.8 oz (97.9 kg)  Height: 5\' 8"  (1.727 m)   Body mass index is 32.81 kg/m.  Wt Readings from Last 3 Encounters:  12/27/23 215 lb 12.8 oz (97.9 kg)  09/27/23 215 lb (97.5 kg)  09/11/23 212 lb (96.2 kg)     Objective:  Physical Exam Vitals and nursing note reviewed.  Constitutional:      Appearance: Normal appearance. She is obese.  HENT:     Head: Normocephalic and atraumatic.  Eyes:     Extraocular Movements: Extraocular movements intact.  Cardiovascular:     Rate and Rhythm: Normal rate and regular rhythm.     Heart sounds: Normal heart sounds.  Pulmonary:     Effort: Pulmonary effort is normal.     Breath sounds: Normal breath sounds.  Musculoskeletal:     Cervical back: Normal range of motion.  Skin:  General: Skin is warm.  Neurological:     General: No focal deficit present.     Mental Status: She is alert.  Psychiatric:        Mood and Affect: Mood normal.        Behavior: Behavior normal.         Assessment And Plan:  Essential hypertension Assessment & Plan: Chronic, fair control.  Goal BP<130/80.  For now, she will continue with lisinopril/hct 20/12.5mg  dailY.  If bp elevated at her next visit, will need to consider the addition of amlodipine to her current regimen.    Orders: -     CMP14+EGFR  Prediabetes Assessment & Plan: Previous labs reviewed, her A1c has been elevated in the past. I will check an A1c today. Reminded to avoid refined sugars including sugary drinks/foods and processed meats  including bacon, sausages and deli meats.    Orders: -     CMP14+EGFR -     Hemoglobin A1c  Anxiety and depression Assessment & Plan: Chronic, her sx are sable on escitalopram 10mg  daily. Importance of medication compliance was discussed with the patient.      Class 1 obesity due to excess calories with body mass index (BMI) of 32.0 to 32.9 in adult, unspecified whether serious comorbidity present Assessment & Plan: She is encouraged to strive for BMI less than 30 to decrease cardiac risk. Advised to aim for at least 150 minutes of exercise per week.    Other orders -     Escitalopram Oxalate; Take 1 tablet (10 mg total) by mouth daily.  Dispense: 90 tablet; Refill: 2     Return for 4 month bp & pre dm f/u. Marland Kitchen  Patient was given opportunity to ask questions. Patient verbalized understanding of the plan and was able to repeat key elements of the plan. All questions were answered to their satisfaction.    I, [name], MD, have reviewed all documentation for this visit. The documentation on 12/27/23 for the exam, diagnosis, procedures, and orders are all accurate and complete.   IF YOU HAVE BEEN REFERRED TO A SPECIALIST, IT MAY TAKE 1-2 WEEKS TO SCHEDULE/PROCESS THE REFERRAL. IF YOU HAVE NOT HEARD FROM US/SPECIALIST IN TWO WEEKS, PLEASE GIVE [place] A CALL AT [phone] X [phone].   THE PATIENT IS ENCOURAGED TO PRACTICE SOCIAL DISTANCING DUE TO THE COVID-19 PANDEMIC.

## 2023-12-27 NOTE — Patient Instructions (Addendum)
 Magnesium glycinate, take one at night  Hypertension, Adult Hypertension is another name for high blood pressure. High blood pressure forces your heart to work harder to pump blood. This can cause problems over time. There are two numbers in a blood pressure reading. There is a top number (systolic) over a bottom number (diastolic). It is best to have a blood pressure that is below 120/80. What are the causes? The cause of this condition is not known. Some other conditions can lead to high blood pressure. What increases the risk? Some lifestyle factors can make you more likely to develop high blood pressure: Smoking. Not getting enough exercise or physical activity. Being overweight. Having too much fat, sugar, calories, or salt (sodium) in your diet. Drinking too much alcohol. Other risk factors include: Having any of these conditions: Heart disease. Diabetes. High cholesterol. Kidney disease. Obstructive sleep apnea. Having a family history of high blood pressure and high cholesterol. Age. The risk increases with age. Stress. What are the signs or symptoms? High blood pressure may not cause symptoms. Very high blood pressure (hypertensive crisis) may cause: Headache. Fast or uneven heartbeats (palpitations). Shortness of breath. Nosebleed. Vomiting or feeling like you may vomit (nauseous). Changes in how you see. Very bad chest pain. Feeling dizzy. Seizures. How is this treated? This condition is treated by making healthy lifestyle changes, such as: Eating healthy foods. Exercising more. Drinking less alcohol. Your doctor may prescribe medicine if lifestyle changes do not help enough and if: Your top number is above 130. Your bottom number is above 80. Your personal target blood pressure may vary. Follow these instructions at home: Eating and drinking  If told, follow the DASH eating plan. To follow this plan: Fill one half of your plate at each meal with fruits and  vegetables. Fill one fourth of your plate at each meal with whole grains. Whole grains include whole-wheat pasta, brown rice, and whole-grain bread. Eat or drink low-fat dairy products, such as skim milk or low-fat yogurt. Fill one fourth of your plate at each meal with low-fat (lean) proteins. Low-fat proteins include fish, chicken without skin, eggs, beans, and tofu. Avoid fatty meat, cured and processed meat, or chicken with skin. Avoid pre-made or processed food. Limit the amount of salt in your diet to less than 1,500 mg each day. Do not drink alcohol if: Your doctor tells you not to drink. You are pregnant, may be pregnant, or are planning to become pregnant. If you drink alcohol: Limit how much you have to: 0-1 drink a day for women. 0-2 drinks a day for men. Know how much alcohol is in your drink. In the [place] one drink equals one 12 oz bottle of beer (355 mL), one 5 oz glass of wine (148 mL), or one 1 oz glass of hard liquor (44 mL). Lifestyle  Work with your doctor to stay at a healthy weight or to lose weight. Ask your doctor what the best weight is for you. Get at least 30 minutes of exercise that causes your heart to beat faster (aerobic exercise) most days of the week. This may include walking, swimming, or biking. Get at least 30 minutes of exercise that strengthens your muscles (resistance exercise) at least 3 days a week. This may include lifting weights or doing Pilates. Do not smoke or use any products that contain nicotine or tobacco. If you need help quitting, ask your doctor. Check your blood pressure at home as told by your doctor. Keep all follow-up  visits. Medicines Take over-the-counter and prescription medicines only as told by your doctor. Follow directions carefully. Do not skip doses of blood pressure medicine. The medicine does not work as well if you skip doses. Skipping doses also puts you at risk for problems. Ask your doctor about side effects or  reactions to medicines that you should watch for. Contact a doctor if: You think you are having a reaction to the medicine you are taking. You have headaches that keep coming back. You feel dizzy. You have swelling in your ankles. You have trouble with your vision. Get help right away if: You get a very bad headache. You start to feel mixed up (confused). You feel weak or numb. You feel faint. You have very bad pain in your: Chest. Belly (abdomen). You vomit more than once. You have trouble breathing. These symptoms may be an emergency. Get help right away. Call 911. Do not wait to see if the symptoms will go away. Do not drive yourself to the hospital. Summary Hypertension is another name for high blood pressure. High blood pressure forces your heart to work harder to pump blood. For most people, a normal blood pressure is less than 120/80. Making healthy choices can help lower blood pressure. If your blood pressure does not get lower with healthy choices, you may need to take medicine. This information is not intended to replace advice given to you by your health care provider. Make sure you discuss any questions you have with your health care provider. Document Revised: 07/28/2021 Document Reviewed: 07/28/2021 Elsevier Patient Education  2024 [employer].

## 2023-12-28 LAB — CMP14+EGFR
ALT: 18 IU/L (ref 0–32)
AST: 17 IU/L (ref 0–40)
Albumin: 4.2 g/dL (ref 3.9–4.9)
Alkaline Phosphatase: 65 IU/L (ref 44–121)
BUN/Creatinine Ratio: 15 (ref 9–23)
BUN: 11 mg/dL (ref 6–24)
Bilirubin Total: 0.5 mg/dL (ref 0.0–1.2)
CO2: 23 mmol/L (ref 20–29)
Calcium: 9.2 mg/dL (ref 8.7–10.2)
Chloride: 106 mmol/L (ref 96–106)
Creatinine, Ser: 0.71 mg/dL (ref 0.57–1.00)
Globulin, Total: 2.8 g/dL (ref 1.5–4.5)
Glucose: 95 mg/dL (ref 70–99)
Potassium: 4.1 mmol/L (ref 3.5–5.2)
Sodium: 143 mmol/L (ref 134–144)
Total Protein: 7 g/dL (ref 6.0–8.5)
eGFR: 105 mL/min/{1.73_m2} (ref >59)

## 2023-12-28 LAB — HEMOGLOBIN A1C
Est. average glucose Bld gHb Est-mCnc: 120 mg/dL
Hgb A1c MFr Bld: 5.8 % — ABNORMAL HIGH (ref 4.8–5.6)

## 2024-01-02 NOTE — Assessment & Plan Note (Signed)
 Previous labs reviewed, her A1c has been elevated in the past. I will check an A1c today. Reminded to avoid refined sugars including sugary drinks/foods and processed meats including bacon, sausages and deli meats.

## 2024-01-02 NOTE — Assessment & Plan Note (Signed)
 Chronic, her sx are sable on escitalopram 10mg  daily. Importance of medication compliance was discussed with the patient.

## 2024-01-02 NOTE — Assessment & Plan Note (Signed)
 Chronic, fair control.  Goal BP<130/80.  For now, she will continue with lisinopril/hct 20/12.5mg  dailY.  If bp elevated at her next visit, will need to consider the addition of amlodipine to her current regimen.

## 2024-01-02 NOTE — Assessment & Plan Note (Signed)
 She is encouraged to strive for BMI less than 30 to decrease cardiac risk. Advised to aim for at least 150 minutes of exercise per week.

## 2024-01-21 ENCOUNTER — Ambulatory Visit (HOSPITAL_COMMUNITY): Payer: Self-pay

## 2024-01-23 ENCOUNTER — Ambulatory Visit (HOSPITAL_COMMUNITY)
Admission: RE | Admit: 2024-01-23 | Discharge: 2024-01-23 | Disposition: A | Discharge status: Home / Self Care | Source: Ambulatory Visit | Attending: Emergency Medicine | Admitting: Emergency Medicine

## 2024-01-23 ENCOUNTER — Encounter (HOSPITAL_COMMUNITY): Payer: Self-pay

## 2024-01-23 VITALS — BP 154/103 | HR 86 | Temp 99.1°F | Resp 18

## 2024-01-23 DIAGNOSIS — J988 Other specified respiratory disorders: Secondary | ICD-10-CM

## 2024-01-23 DIAGNOSIS — B9789 Other viral agents as the cause of diseases classified elsewhere: Secondary | ICD-10-CM | POA: Diagnosis not present

## 2024-01-23 DIAGNOSIS — R051 Acute cough: Secondary | ICD-10-CM | POA: Diagnosis not present

## 2024-01-23 MED ORDER — BENZONATATE 100 MG PO CAPS: 100.0000 mg | ORAL_CAPSULE | Freq: Three times a day (TID) | ORAL | 0 refills | Status: DC

## 2024-01-23 MED ORDER — PROMETHAZINE-DM 6.25-15 MG/5ML PO SYRP: 5.0000 mL | ORAL_SOLUTION | Freq: Every evening | ORAL | 0 refills | Status: DC | PRN

## 2024-01-23 NOTE — Discharge Instructions (Signed)
 As discussed I believe your symptoms are from a viral respiratory illness.  This requires symptomatic treatment at this time.  I have prescribed Tessalon that you can take every 8 hours as needed for cough.  I have also prescribed Promethazine DM cough syrup that you can take at night for cough and to help you sleep.  This can make you drowsy so do not work, drive, or drink alcohol while taking  Otherwise recommend Mucinex as needed for cough and congestion.  You can continue taking cetirizine and using throat lozenges as needed.  Alternate between 650 mg of Tylenol and 400 mg of ibuprofen every 4-6 hours as needed for sore throat, headache, body aches, and fever.  Make sure you are staying hydrated and getting plenty of rest.  Be sure to take your blood pressure medication once daily as prescribed.  Avoid over-the-counter medications that include phenylephrine and pseudoephedrine for your symptoms as these could increase your blood pressure.  Return here for symptoms persist or worsen.

## 2024-01-23 NOTE — ED Triage Notes (Signed)
 Pt c/o cough, sore throat, nasal congestion, post nasal drip, and body aches since Saturday. States took OTC meds with no relief.

## 2024-01-23 NOTE — ED Provider Notes (Signed)
 [facility]    CSN: [id number] Arrival date & time: 01/23/24  1629      History   Chief Complaint Chief Complaint  Patient presents with   Sore Throat    Cough - Entered by patient    HPI Deanna Sparks is a 48 y.o. female.   Patient presents with cough, congestion, and postnasal drip that began on 3/29.  Patient states that she initially thought she just had allergies.  Patient states that yesterday she began to have a sore throat and bodyaches.   Denies known fever, shortness of breath, chest pain, abdominal pain, vomiting, and diarrhea.  Patient states that her cough keeps her up at night.  Patient states that she has been taking cetirizine, over-the-counter allergy medication, and throat lozenges as with minimal relief.  Patient's blood pressure is elevated in clinic today.  Patient states that she did not take her blood pressure medication today.    Sore Throat    Past Medical History:  Diagnosis Date   Genital herpes    last outbreak 12/2011   Hypertension    Vaginal Pap smear, abnormal     Patient Active Problem List   Diagnosis Date Noted   Prediabetes 12/27/2023   Migraine 09/02/2023   Otalgia of both ears 07/16/2023   Sensation of fullness in both ears 07/16/2023   Seasonal allergic rhinitis 07/16/2023   Class 1 obesity due to excess calories with body mass index (BMI) of 32.0 to 32.9 in adult 07/16/2023   ASCUS with positive high risk HPV cervical 08/24/2021   Encounter for general adult medical examination w/o abnormal findings 08/24/2021   Anxiety and depression 06/22/2021   OSA on CPAP 06/22/2021   Sleeps in sitting position due to orthopnea 03/14/2021   Loud snoring 03/14/2021   Insufficient sleep syndrome 03/14/2021   Nocturia more than twice per night 03/14/2021   Caregiver stress syndrome 03/14/2021   Other insomnia 03/14/2021   Sleep apnea with cognitive complaints 03/14/2021   Multinodular goiter (nontoxic) 09/24/2020    GAD (generalized anxiety disorder) 05/29/2019   HSV infection 05/29/2019   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   Vitamin D deficiency 05/28/2019   Essential hypertension 01/21/2019   Goiter 01/21/2019   Postnasal drip 01/21/2019   Plantar fasciitis 10/10/2018   Carpal tunnel syndrome 10/22/2015   Carpal tunnel syndrome, bilateral 10/22/2015   Preterm labor 04/03/2012    Past Surgical History:  Procedure Laterality Date   CESAREAN SECTION     COLPOSCOPY  10/03/2021   LIPOMA EXCISION  2008   removed from forehead    OB History     Gravida  3   Para  3   Term  2   Preterm  1   AB  0   Living  3      SAB  0   IAB  0   Ectopic  0   Multiple  0   Live Births  1            Home Medications    Prior to Admission medications   Medication Sig Start Date End Date Taking? Authorizing Provider  benzonatate (TESSALON) 100 MG capsule Take 1 capsule (100 mg total) by mouth every 8 (eight) hours. 01/23/24  Yes Wynonia Lawman A, NP  promethazine-dextromethorphan (PROMETHAZINE-DM) 6.25-15 MG/5ML syrup Take 5 mLs by mouth at bedtime as needed for cough. 01/23/24  Yes Susann Givens, Yamilex Borgwardt A, NP  acyclovir (ZOVIRAX) 400 MG tablet Take 1 tablet (400 mg total) by mouth  2 (two) times daily. 05/23/23   Dorothyann Peng, MD  diazepam (VALIUM) 10 MG tablet Take 10 mg by mouth daily as needed. 12/17/23   [provider]  escitalopram (LEXAPRO) 10 MG tablet Take 1 tablet (10 mg total) by mouth daily. 12/27/23 12/26/24  Dorothyann Peng, MD  fluconazole (DIFLUCAN) 150 MG tablet Take 1 tablet (150 mg total) by mouth once as needed for up to 2 doses (take one pill on day 1, and the second pill 3 days later). Patient not taking: Reported on 12/27/2023 09/27/23   Rising, Lurena Joiner, PA-C  lisinopril-hydrochlorothiazide (ZESTORETIC) 20-12.5 MG tablet Take 1 tablet by mouth daily. 09/04/23   Dorothyann Peng, MD    Family History Family History  Problem Relation Age of Onset   Hypertension Mother    Healthy  Father    Thyroid disease Brother    Anesthesia problems Neg Hx    Other Neg Hx     Social History Social History   Tobacco Use   Smoking status: Never   Smokeless tobacco: Never  Vaping Use   Vaping status: Never Used  Substance Use Topics   Alcohol use: Yes    Comment: occasionally   Drug use: Never     Allergies   Codeine and Ultram [tramadol]   Review of Systems Review of Systems  Per HPI  Physical Exam Triage Vital Signs ED Triage Vitals [01/23/24 1707]  Encounter Vitals Group     BP (!) 154/103     Systolic BP Percentile      Diastolic BP Percentile      Pulse Rate 86     Resp 18     Temp 99.1 F (37.3 C)     Temp Source Oral     SpO2 97 %     Weight      Height      Head Circumference      Peak Flow      Pain Score 7     Pain Loc      Pain Education      Exclude from Growth Chart    No data found.  Updated Vital Signs BP (!) 154/103 (BP Location: Right Arm) Comment: states didn't take her meds today.  Pulse 86   Temp 99.1 F (37.3 C) (Oral)   Resp 18   SpO2 97%   Visual Acuity Right Eye Distance:   Left Eye Distance:   Bilateral Distance:    Right Eye Near:   Left Eye Near:    Bilateral Near:     Physical Exam Vitals and nursing note reviewed.  Constitutional:      General: She is not in acute distress.    Appearance: Normal appearance. She is not ill-appearing.  HENT:     Right Ear: Tympanic membrane, ear canal and external ear normal.     Left Ear: Tympanic membrane, ear canal and external ear normal.     Nose: Congestion and rhinorrhea present.     Mouth/Throat:     Mouth: Mucous membranes are moist.     Pharynx: Posterior oropharyngeal erythema present. No oropharyngeal exudate.  Cardiovascular:     Rate and Rhythm: Normal rate and regular rhythm.  Pulmonary:     Effort: Pulmonary effort is normal.     Breath sounds: Normal breath sounds.  Skin:    General: Skin is warm and dry.  Neurological:     Mental Status: She  is alert.      UC Treatments / Results  Labs (all labs ordered are listed, but only abnormal results are displayed) Labs Reviewed - No data to display  EKG   Radiology No results found.  Procedures Procedures (including critical care time)  Medications Ordered in UC Medications - No data to display  Initial Impression / Assessment and Plan / UC Course  I have reviewed the triage vital signs and the nursing notes.  Pertinent labs & imaging results that were available during my care of the patient were reviewed by me and considered in my medical decision making (see chart for details).     Upon assessment congestion and rhinorrhea are present, mild erythema noted to pharynx.  Lungs clear bilaterally to auscultation  Discussed that symptoms are likely related to viral illness.  Prescribed Tessalon and Promethazine DM as needed for cough.  Discussed over-the-counter medication as needed for symptoms.  Discussed importance of taking her blood pressure medication regularly.  Discussed return precautions Final Clinical Impressions(s) / UC Diagnoses   Final diagnoses:  Viral respiratory illness  Acute cough     Discharge Instructions      As discussed I believe your symptoms are from a viral respiratory illness.  This requires symptomatic treatment at this time.  I have prescribed Tessalon that you can take every 8 hours as needed for cough.  I have also prescribed Promethazine DM cough syrup that you can take at night for cough and to help you sleep.  This can make you drowsy so do not work, drive, or drink alcohol while taking  Otherwise recommend Mucinex as needed for cough and congestion.  You can continue taking cetirizine and using throat lozenges as needed.  Alternate between 650 mg of Tylenol and 400 mg of ibuprofen every 4-6 hours as needed for sore throat, headache, body aches, and fever.  Make sure you are staying hydrated and getting plenty of rest.  Be sure  to take your blood pressure medication once daily as prescribed.  Avoid over-the-counter medications that include phenylephrine and pseudoephedrine for your symptoms as these could increase your blood pressure.  Return here for symptoms persist or worsen.    ED Prescriptions     Medication Sig Dispense Auth. Provider   benzonatate (TESSALON) 100 MG capsule Take 1 capsule (100 mg total) by mouth every 8 (eight) hours. 21 capsule Wynonia Lawman A, NP   promethazine-dextromethorphan (PROMETHAZINE-DM) 6.25-15 MG/5ML syrup Take 5 mLs by mouth at bedtime as needed for cough. 118 mL Wynonia Lawman A, NP      PDMP not reviewed this encounter.   Wynonia Lawman A, NP 01/23/24 1739

## 2024-01-25 ENCOUNTER — Other Ambulatory Visit: Payer: Self-pay | Admitting: Internal Medicine

## 2024-01-25 DIAGNOSIS — Z1231 Encounter for screening mammogram for malignant neoplasm of breast: Secondary | ICD-10-CM

## 2024-01-31 ENCOUNTER — Encounter: Payer: Self-pay | Admitting: Internal Medicine

## 2024-02-09 ENCOUNTER — Ambulatory Visit (HOSPITAL_COMMUNITY): Payer: Self-pay

## 2024-02-09 DIAGNOSIS — M791 Myalgia, unspecified site: Secondary | ICD-10-CM | POA: Diagnosis not present

## 2024-02-20 ENCOUNTER — Other Ambulatory Visit (HOSPITAL_COMMUNITY): Payer: Medicaid Other

## 2024-02-25 ENCOUNTER — Ambulatory Visit: Payer: Self-pay | Admitting: Internal Medicine

## 2024-02-28 ENCOUNTER — Ambulatory Visit
Admission: RE | Admit: 2024-02-28 | Discharge: 2024-02-28 | Disposition: A | Discharge status: Home / Self Care | Source: Ambulatory Visit | Attending: Internal Medicine | Admitting: Internal Medicine

## 2024-02-28 DIAGNOSIS — Z1231 Encounter for screening mammogram for malignant neoplasm of breast: Secondary | ICD-10-CM

## 2024-03-04 ENCOUNTER — Other Ambulatory Visit: Payer: Self-pay | Admitting: Internal Medicine

## 2024-03-04 DIAGNOSIS — R928 Other abnormal and inconclusive findings on diagnostic imaging of breast: Secondary | ICD-10-CM

## 2024-03-19 DIAGNOSIS — G5603 Carpal tunnel syndrome, bilateral upper limbs: Secondary | ICD-10-CM | POA: Diagnosis not present

## 2024-03-20 ENCOUNTER — Ambulatory Visit
Admission: RE | Admit: 2024-03-20 | Discharge: 2024-03-20 | Disposition: A | Discharge status: Home / Self Care | Source: Ambulatory Visit | Attending: Internal Medicine | Admitting: Internal Medicine

## 2024-03-20 ENCOUNTER — Ambulatory Visit

## 2024-03-20 ENCOUNTER — Encounter: Payer: Self-pay | Admitting: Internal Medicine

## 2024-03-20 DIAGNOSIS — R928 Other abnormal and inconclusive findings on diagnostic imaging of breast: Secondary | ICD-10-CM

## 2024-03-23 ENCOUNTER — Encounter: Payer: Self-pay | Admitting: Internal Medicine

## 2024-03-23 ENCOUNTER — Other Ambulatory Visit: Payer: Self-pay | Admitting: Internal Medicine

## 2024-03-24 ENCOUNTER — Other Ambulatory Visit: Payer: Self-pay

## 2024-03-24 MED ORDER — ACYCLOVIR 400 MG PO TABS: 400.0000 mg | ORAL_TABLET | Freq: Two times a day (BID) | ORAL | 2 refills | Status: DC

## 2024-03-26 ENCOUNTER — Encounter: Payer: Self-pay | Admitting: Internal Medicine

## 2024-03-26 ENCOUNTER — Ambulatory Visit (INDEPENDENT_AMBULATORY_CARE_PROVIDER_SITE_OTHER): Payer: Self-pay | Admitting: Internal Medicine

## 2024-03-26 VITALS — BP 126/82 | HR 89 | Temp 98.9°F | Ht 68.0 in | Wt 212.4 lb

## 2024-03-26 DIAGNOSIS — E6609 Other obesity due to excess calories: Secondary | ICD-10-CM

## 2024-03-26 DIAGNOSIS — E66811 Obesity, class 1: Secondary | ICD-10-CM

## 2024-03-26 DIAGNOSIS — R7303 Prediabetes: Secondary | ICD-10-CM | POA: Diagnosis not present

## 2024-03-26 DIAGNOSIS — R1319 Other dysphagia: Secondary | ICD-10-CM | POA: Diagnosis not present

## 2024-03-26 DIAGNOSIS — Z6832 Body mass index (BMI) 32.0-32.9, adult: Secondary | ICD-10-CM

## 2024-03-26 DIAGNOSIS — I10 Essential (primary) hypertension: Secondary | ICD-10-CM | POA: Diagnosis not present

## 2024-03-26 DIAGNOSIS — E049 Nontoxic goiter, unspecified: Secondary | ICD-10-CM

## 2024-03-26 DIAGNOSIS — R7309 Other abnormal glucose: Secondary | ICD-10-CM | POA: Diagnosis not present

## 2024-03-26 NOTE — Progress Notes (Signed)
 I,Deanna Sparks, CMA,acting as a Neurosurgeon for Smiley Dung, MD.,have documented all relevant documentation on the behalf of Smiley Dung, MD,as directed by  Smiley Dung, MD while in the presence of Smiley Dung, MD.  Subjective:  Patient ID: Deanna Sparks , female    DOB: 1976/08/18 , 48 y.o.   MRN: [id number]  Chief Complaint  Patient presents with   Hypertension    Patient presents today for thyroid  check. She currently dos not take any prescribed medications for thyroid . Patient also states she is having trouble swallowing when she eats since the 26th of May. Denies headaches, chest pain & sob.    Prediabetes    HPI Discussed the use of AI scribe software for clinical note transcription with the patient, who gave verbal consent to proceed.  History of Present Illness Deanna Sparks is a 48 year old female with hypertension, prediabetes and thyroid  enlargement who presents with difficulty swallowing.  She has been experiencing difficulty swallowing since Mar 17, 2024, describing it as a sensation of a 'knot in her throat' that occurs with solids. Initially attributing it to a dry throat, she became concerned after noticing visible swelling in a photograph. She feels that the nodules on her thyroid  have grown. The difficulty swallowing has persisted for the past week, although it is 'a little easier' now compared to a week ago. No recent colds or COVID-19 exposure, but she feels 'kind of stuffy'.  She has a history of thyroid  enlargement and was advised to monitor for swallowing difficulties due to the enlargement. It has been about a year since her last thyroid  ultrasound.  She has prediabetes and is due for routine monitoring. She is also due for a blood pressure check, which she monitors at home. She has not started her shingles vaccines.  No recent COVID-19 exposure or symptoms. She feels 'kind of stuffy' but has not had a recent cold. No problems with chewing  food.   Hypertension This is a chronic problem. The current episode started more than 1 year ago. The problem has been gradually improving since onset. The problem is controlled. Pertinent negatives include no anxiety, blurred vision, palpitations or shortness of breath. Past treatments include diuretics and ACE inhibitors.     Past Medical History:  Diagnosis Date   Genital herpes    last outbreak 12/2011   Hypertension    Vaginal Pap smear, abnormal      Family History  Problem Relation Age of Onset   Hypertension Mother    Healthy Father    Thyroid  disease Brother    Anesthesia problems Neg Hx    Other Neg Hx      Current Outpatient Medications:    acyclovir  (ZOVIRAX ) 400 MG tablet, Take 1 tablet (400 mg total) by mouth 2 (two) times daily., Disp: 90 tablet, Rfl: 2   escitalopram  (LEXAPRO ) 10 MG tablet, Take 1 tablet (10 mg total) by mouth daily., Disp: 90 tablet, Rfl: 2   lisinopril -hydrochlorothiazide  (ZESTORETIC ) 20-12.5 MG tablet, Take 1 tablet by mouth daily., Disp: 90 tablet, Rfl: 2   benzonatate  (TESSALON ) 100 MG capsule, Take 1 capsule (100 mg total) by mouth every 8 (eight) hours. (Patient not taking: Reported on 03/26/2024), Disp: 21 capsule, Rfl: 0   diazepam (VALIUM) 10 MG tablet, Take 10 mg by mouth daily as needed. (Patient not taking: Reported on 03/26/2024), Disp: , Rfl:    fluconazole  (DIFLUCAN ) 150 MG tablet, Take 1 tablet (150 mg total) by mouth  once as needed for up to 2 doses (take one pill on day 1, and the second pill 3 days later). (Patient not taking: Reported on 03/26/2024), Disp: 2 tablet, Rfl: 0   promethazine -dextromethorphan (PROMETHAZINE -DM) 6.25-15 MG/5ML syrup, Take 5 mLs by mouth at bedtime as needed for cough. (Patient not taking: Reported on 03/26/2024), Disp: 118 mL, Rfl: 0   Allergies  Allergen Reactions   Codeine Itching   Ultram [Tramadol] Itching     Review of Systems  Constitutional: Negative.   Eyes:  Negative for blurred vision.   Respiratory: Negative.  Negative for shortness of breath.   Cardiovascular: Negative.  Negative for palpitations.  Gastrointestinal:        She c/o difficulty swallowing. No recent URI. Issue is w/ solids, not liquids.   Neurological: Negative.   Psychiatric/Behavioral: Negative.       Today's Vitals   03/26/24 1444  BP: 126/82  Pulse: 89  Temp: 98.9 F (37.2 C)  SpO2: 98%  Weight: 212 lb 6.4 oz (96.3 kg)   Body mass index is 32.3 kg/m.  Wt Readings from Last 3 Encounters:  03/26/24 212 lb 6.4 oz (96.3 kg)  12/27/23 215 lb 12.8 oz (97.9 kg)  09/27/23 215 lb (97.5 kg)     Objective:  Physical Exam Vitals and nursing note reviewed.  Constitutional:      Appearance: Normal appearance. She is obese.  HENT:     Head: Normocephalic and atraumatic.  Eyes:     Extraocular Movements: Extraocular movements intact.  Neck:     Thyroid : Thyromegaly present.  Cardiovascular:     Rate and Rhythm: Normal rate and regular rhythm.     Heart sounds: Normal heart sounds.  Pulmonary:     Effort: Pulmonary effort is normal.     Breath sounds: Normal breath sounds.  Musculoskeletal:     Cervical back: Normal range of motion.  Skin:    General: Skin is warm.  Neurological:     General: No focal deficit present.     Mental Status: She is alert.  Psychiatric:        Mood and Affect: Mood normal.        Behavior: Behavior normal.      Assessment And Plan:  Essential hypertension Assessment & Plan: Chronic, fair control.  Goal BP<130/80.  For now, she will continue with lisinopril /hct 20/12.5mg  dailY.  If bp elevated at her next visit, will need to consider the addition of amlodipine  to her current regimen.    Orders: -     CMP14+EGFR  Prediabetes Assessment & Plan: Previous labs reviewed, her A1c has been elevated in the past. I will check an A1c today. Reminded to avoid refined sugars including sugary drinks/foods and processed meats including bacon, sausages and deli meats.     Orders: -     Hemoglobin A1c  Other dysphagia Assessment & Plan: Suspected thyroid  enlargement or thyroiditis, possibly post-viral. History of thyroid  nodules and goiter. Swallowing test recommended to rule out other causes of dysphagia. - Order thyroid  ultrasound. - Order swallowing test. - Perform thyroid  function tests.  Orders: -     DG ESOPHAGUS W SINGLE CM (SOL OR THIN BA); Future  Goiter Assessment & Plan: Chronic, appears to have enlarged in size - Order thyroid  ultrasound - Check thyroid  labs  Orders: -     TSH + free T4 -     US  THYROID ; Future  Class 1 obesity due to excess calories with body mass index (BMI)  of 32.0 to 32.9 in adult, unspecified whether serious comorbidity present Assessment & Plan: She is encouraged to strive for BMI less than 30 to decrease cardiac risk. Advised to aim for at least 150 minutes of exercise per week.    Return if symptoms worsen or fail to improve.  Patient was given opportunity to ask questions. Patient verbalized understanding of the plan and was able to repeat key elements of the plan. All questions were answered to their satisfaction.   I, Smiley Dung, MD, have reviewed all documentation for this visit. The documentation on 03/29/24 for the exam, diagnosis, procedures, and orders are all accurate and complete.   IF YOU HAVE BEEN REFERRED TO A SPECIALIST, IT MAY TAKE 1-2 WEEKS TO SCHEDULE/PROCESS THE REFERRAL. IF YOU HAVE NOT HEARD FROM US /SPECIALIST IN TWO WEEKS, PLEASE GIVE US  A CALL AT [phone] X [phone].   THE PATIENT IS ENCOURAGED TO PRACTICE SOCIAL DISTANCING DUE TO THE COVID-19 PANDEMIC.

## 2024-03-26 NOTE — Patient Instructions (Signed)
Goiter  A goiter is an enlarged thyroid gland. The thyroid gland is located in the lower front part of the neck, just in front of the windpipe (trachea). This gland makes hormones that affect how the body processes food for energy (metabolism) and how the heart and brain function. Most goiters are painless and are not a cause for concern. Some goiters can affect the way your thyroid makes thyroid hormones. Goiters and conditions that cause goiters can be treated, if necessary. What are the causes? This condition may be caused by: Lack of a mineral called iodine. The thyroid gland uses iodine to make thyroid hormones. Diseases that attack healthy cells in the body (autoimmune diseases) and affect thyroid function, such as Graves' disease or Hashimoto's disease. These diseases may cause the body to produce too much thyroid hormone (hyperthyroidism) or too little of the hormone (hypothyroidism). Conditions that cause inflammation of the thyroid (thyroiditis). One or more small growths on the thyroid (nodular goiter). Other causes may include: Medical problems caused by abnormal genes that are passed from parent to child (genetic defects). Thyroid injury or infection. Tumors that may or may not be cancerous. Pregnancy. Certain medicines. Exposure to radiation. In some cases, the cause may not be known. What increases the risk? The following factors may make you more likely to develop this condition: You do not get enough iodine in your diet. You have a family history of goiter. You are female. You are older than age 40. You smoke tobacco. You have had exposure to radiation. What are the signs or symptoms? The main symptom of this condition is swelling in the lower, front part of the neck. This swelling can range from a very small bump to a large lump. Other symptoms may include: A tight feeling in the throat. A hoarse voice. Coughing. Wheezing. Difficulty swallowing or breathing. Bulging  veins in the neck. Dizziness. When a goiter is the result of an overactive thyroid (hyperthyroidism), symptoms may also include: Nervousness or restlessness. Inability to tolerate heat. Unexplained weight loss. Diarrhea. Changes in heartbeat, such as skipped beats, extra beats, or a rapid heart rate. Loss of menstruation. Increased appetite. Sleep problems. When a goiter is the result of an underactive thyroid (hypothyroidism), symptoms may also include: Feeling tired (fatigue). Inability to tolerate cold. Weight gain that is not explained by a change in diet or exercise habits. Dry skin or coarse hair. Irregular menstrual periods. Constipation. Sadness or depression. In some cases, there may not be any symptoms. How is this diagnosed? This condition may be diagnosed based on your symptoms, your medical history, and a physical exam. You may have tests, such as: Blood tests to check thyroid function. Imaging tests, such as: Ultrasound. CT scan. MRI. Thyroid scan. Removal of a tissue sample (biopsy) of the goiter or any nodules. The sample will be tested to check for cancer. How is this treated? Treatment for this condition depends on the cause and your symptoms. Treatment may include: Medicines to regulate thyroid hormone levels. Anti-inflammatory medicines or steroid medicines, if the goiter is caused by inflammation. Iodine supplements or changes to your diet, if the goiter is caused by iodine deficiency. Radioactive iodine treatment. Surgery to remove your thyroid. In some cases, you may only need regular check-ups with your health care provider to monitor your condition, and you may not need treatment. Follow these instructions at home: Follow instructions from your health care provider about any changes to your diet. Take over-the-counter and prescription medicines only as told   by your health care provider. These include supplements. Do not use any products that contain  nicotine or tobacco. These products include cigarettes, chewing tobacco, and vaping devices, such as e-cigarettes. If you need help quitting, ask your health care provider. Keep all follow-up visits. Your health care provider will want to repeat blood tests to check thyroid function. Where to find more information American Thyroid Association: thyroid.org Endocrine Society: endocrine.org Contact a health care provider if: Your symptoms do not get better with treatment. You have nausea, vomiting, or diarrhea. You have a fever. You suddenly become very weak. You experience extreme restlessness. Get help right away if: You have sudden, unexplained confusion or other mental changes. You have chest pain. You have trouble breathing or swallowing. You have fast or irregular heartbeats (palpitations). These symptoms may be an emergency. Get help right away. Call 911. Do not wait to see if the symptoms will go away. Do not drive yourself to the hospital. Summary A goiter is an enlarged thyroid gland. The thyroid gland is located in the lower front part of the neck, just in front of the windpipe. The main symptom of this condition is swelling in the lower, front part of the neck. This swelling can range from a very small bump to a large lump. Treatment for this condition depends on the cause and your symptoms. You may need medicines, supplements, or regular monitoring of your condition. This information is not intended to replace advice given to you by your health care provider. Make sure you discuss any questions you have with your health care provider. Document Revised: 12/02/2021 Document Reviewed: 12/02/2021 Elsevier Patient Education  2024 [employer].

## 2024-03-27 ENCOUNTER — Encounter: Payer: Self-pay | Admitting: Internal Medicine

## 2024-03-27 ENCOUNTER — Ambulatory Visit
Admission: RE | Admit: 2024-03-27 | Discharge: 2024-03-27 | Disposition: A | Discharge status: Home / Self Care | Source: Ambulatory Visit | Attending: Internal Medicine | Admitting: Internal Medicine

## 2024-03-27 ENCOUNTER — Ambulatory Visit: Payer: Self-pay | Admitting: Internal Medicine

## 2024-03-27 DIAGNOSIS — E049 Nontoxic goiter, unspecified: Secondary | ICD-10-CM

## 2024-03-27 DIAGNOSIS — E042 Nontoxic multinodular goiter: Secondary | ICD-10-CM | POA: Diagnosis not present

## 2024-03-27 LAB — HEMOGLOBIN A1C
Est. average glucose Bld gHb Est-mCnc: 111 mg/dL
Hgb A1c MFr Bld: 5.5 % (ref 4.8–5.6)

## 2024-03-27 LAB — CMP14+EGFR
ALT: 20 IU/L (ref 0–32)
AST: 17 IU/L (ref 0–40)
Albumin: 4.2 g/dL (ref 3.9–4.9)
Alkaline Phosphatase: 70 IU/L (ref 44–121)
BUN/Creatinine Ratio: 16 (ref 9–23)
BUN: 15 mg/dL (ref 6–24)
Bilirubin Total: 0.5 mg/dL (ref 0.0–1.2)
CO2: 23 mmol/L (ref 20–29)
Calcium: 9.7 mg/dL (ref 8.7–10.2)
Chloride: 102 mmol/L (ref 96–106)
Creatinine, Ser: 0.91 mg/dL (ref 0.57–1.00)
Globulin, Total: 2.7 g/dL (ref 1.5–4.5)
Glucose: 76 mg/dL (ref 70–99)
Potassium: 4.2 mmol/L (ref 3.5–5.2)
Sodium: 138 mmol/L (ref 134–144)
Total Protein: 6.9 g/dL (ref 6.0–8.5)
eGFR: 78 mL/min/{1.73_m2} (ref >59)

## 2024-03-27 LAB — TSH+FREE T4
Free T4: 1.28 ng/dL (ref 0.82–1.77)
TSH: 1.04 u[IU]/mL (ref 0.450–4.500)

## 2024-03-29 DIAGNOSIS — R1319 Other dysphagia: Secondary | ICD-10-CM | POA: Insufficient documentation

## 2024-03-29 NOTE — Assessment & Plan Note (Signed)
 She is encouraged to strive for BMI less than 30 to decrease cardiac risk. Advised to aim for at least 150 minutes of exercise per week.

## 2024-03-29 NOTE — Assessment & Plan Note (Signed)
 Chronic, appears to have enlarged in size - Order thyroid  ultrasound - Check thyroid  labs

## 2024-03-29 NOTE — Assessment & Plan Note (Signed)
 Chronic, fair control.  Goal BP<130/80.  For now, she will continue with lisinopril/hct 20/12.5mg  dailY.  If bp elevated at her next visit, will need to consider the addition of amlodipine to her current regimen.

## 2024-03-29 NOTE — Assessment & Plan Note (Signed)
 Suspected thyroid  enlargement or thyroiditis, possibly post-viral. History of thyroid  nodules and goiter. Swallowing test recommended to rule out other causes of dysphagia. - Order thyroid  ultrasound. - Order swallowing test. - Perform thyroid  function tests.

## 2024-03-29 NOTE — Assessment & Plan Note (Signed)
 Previous labs reviewed, her A1c has been elevated in the past. I will check an A1c today. Reminded to avoid refined sugars including sugary drinks/foods and processed meats including bacon, sausages and deli meats.

## 2024-03-31 ENCOUNTER — Other Ambulatory Visit

## 2024-03-31 ENCOUNTER — Encounter: Payer: Self-pay | Admitting: Internal Medicine

## 2024-03-31 ENCOUNTER — Encounter

## 2024-04-03 ENCOUNTER — Other Ambulatory Visit

## 2024-04-10 ENCOUNTER — Other Ambulatory Visit

## 2024-04-11 ENCOUNTER — Other Ambulatory Visit (HOSPITAL_COMMUNITY)

## 2024-04-28 ENCOUNTER — Ambulatory Visit: Admitting: Internal Medicine

## 2024-05-01 ENCOUNTER — Encounter: Payer: Self-pay | Admitting: Internal Medicine

## 2024-05-01 ENCOUNTER — Ambulatory Visit (HOSPITAL_COMMUNITY): Payer: Self-pay

## 2024-05-15 DIAGNOSIS — H40013 Open angle with borderline findings, low risk, bilateral: Secondary | ICD-10-CM | POA: Diagnosis not present

## 2024-05-15 DIAGNOSIS — H04123 Dry eye syndrome of bilateral lacrimal glands: Secondary | ICD-10-CM | POA: Diagnosis not present

## 2024-06-09 ENCOUNTER — Encounter (HOSPITAL_COMMUNITY): Payer: Self-pay

## 2024-06-09 ENCOUNTER — Ambulatory Visit (INDEPENDENT_AMBULATORY_CARE_PROVIDER_SITE_OTHER)

## 2024-06-09 ENCOUNTER — Ambulatory Visit (HOSPITAL_COMMUNITY)
Admission: RE | Admit: 2024-06-09 | Discharge: 2024-06-09 | Disposition: A | Discharge status: Home / Self Care | Payer: Self-pay | Source: Ambulatory Visit | Attending: Family Medicine | Admitting: Family Medicine

## 2024-06-09 VITALS — BP 130/93 | HR 85 | Temp 98.9°F | Resp 16

## 2024-06-09 DIAGNOSIS — S83241A Other tear of medial meniscus, current injury, right knee, initial encounter: Secondary | ICD-10-CM | POA: Diagnosis not present

## 2024-06-09 DIAGNOSIS — M25561 Pain in right knee: Secondary | ICD-10-CM

## 2024-06-09 DIAGNOSIS — M25562 Pain in left knee: Secondary | ICD-10-CM | POA: Diagnosis not present

## 2024-06-09 MED ORDER — KETOROLAC TROMETHAMINE 30 MG/ML IJ SOLN
INTRAMUSCULAR | Status: AC
  Filled 2024-06-09: qty 1

## 2024-06-09 MED ORDER — KETOROLAC TROMETHAMINE 30 MG/ML IJ SOLN
30.0000 mg | Freq: Once | INTRAMUSCULAR | Status: AC
  Administered 2024-06-09: 30 mg via INTRAMUSCULAR

## 2024-06-09 MED ORDER — KETOROLAC TROMETHAMINE 10 MG PO TABS: 10.0000 mg | ORAL_TABLET | Freq: Four times a day (QID) | ORAL | 0 refills | Status: DC | PRN

## 2024-06-09 NOTE — ED Provider Notes (Signed)
 [facility]    CSN: [id number] Arrival date & time: 06/09/24  1119      History   Chief Complaint Chief Complaint  Patient presents with   Knee Injury    Clemens down knee hit metal bar . Pain whenever walking or bending the knee - Entered by patient    HPI Deanna Sparks is a 48 y.o. female.   HPI Here for bilateral knee pain.  Yesterday she was standing on her son's bed to open event and she felt, striking both knees on the bar of her sons bed.  The right knee hurts worse than the left though they both hurt. The right knee was also a little swollen  It hurts to bear weight on them.  Codeine and tramadol caused itching.  She has been taking some ibuprofen  and some hydrocodone .  She was taking these already for dental pain as she had a wisdom tooth removed 4 days ago.  Last menstrual cycle was July 20. She last took ibuprofen  about 5 this morning. Past Medical History:  Diagnosis Date   Genital herpes    last outbreak 12/2011   Hypertension    Vaginal Pap smear, abnormal     Patient Active Problem List   Diagnosis Date Noted   Other dysphagia 03/29/2024   Prediabetes 12/27/2023   Migraine 09/02/2023   Otalgia of both ears 07/16/2023   Sensation of fullness in both ears 07/16/2023   Seasonal allergic rhinitis 07/16/2023   Class 1 obesity due to excess calories with body mass index (BMI) of 32.0 to 32.9 in adult 07/16/2023   ASCUS with positive high risk HPV cervical 08/24/2021   Encounter for general adult medical examination w/o abnormal findings 08/24/2021   Anxiety and depression 06/22/2021   OSA on CPAP 06/22/2021   Sleeps in sitting position due to orthopnea 03/14/2021   Loud snoring 03/14/2021   Insufficient sleep syndrome 03/14/2021   Nocturia more than twice per night 03/14/2021   Caregiver stress syndrome 03/14/2021   Other insomnia 03/14/2021   Sleep apnea with cognitive complaints 03/14/2021   Multinodular goiter (nontoxic) 09/24/2020    GAD (generalized anxiety disorder) 05/29/2019   HSV infection 05/29/2019   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   Vitamin D  deficiency 05/28/2019   Essential hypertension 01/21/2019   Goiter 01/21/2019   Postnasal drip 01/21/2019   Plantar fasciitis 10/10/2018   Carpal tunnel syndrome 10/22/2015   Carpal tunnel syndrome, bilateral 10/22/2015   Preterm labor 04/03/2012    Past Surgical History:  Procedure Laterality Date   CESAREAN SECTION     COLPOSCOPY  10/03/2021   LIPOMA EXCISION  2008   removed from forehead    OB History     Gravida  3   Para  3   Term  2   Preterm  1   AB  0   Living  3      SAB  0   IAB  0   Ectopic  0   Multiple  0   Live Births  1            Home Medications    Prior to Admission medications   Medication Sig Start Date End Date Taking? Authorizing Provider  fluticasone  (FLONASE ) 50 MCG/ACT nasal spray Place 1 spray into both nostrils daily. 09/17/22  Yes [provider]  HYDROcodone -acetaminophen  (NORCO) 10-325 MG tablet Take 1 tablet by mouth every 6 (six) hours as needed. 05/19/24  Yes [provider]  ketorolac  (TORADOL ) 10  MG tablet Take 1 tablet (10 mg total) by mouth every 6 (six) hours as needed (pain). 06/09/24  Yes Vonna Sharlet POUR, MD  acyclovir  (ZOVIRAX ) 400 MG tablet Take 1 tablet (400 mg total) by mouth 2 (two) times daily. 03/24/24   Jarold Medici, MD  escitalopram  (LEXAPRO ) 10 MG tablet Take 1 tablet (10 mg total) by mouth daily. 12/27/23 12/26/24  Jarold Medici, MD  lisinopril -hydrochlorothiazide  (ZESTORETIC ) 20-12.5 MG tablet Take 1 tablet by mouth daily. 09/04/23   Jarold Medici, MD    Family History Family History  Problem Relation Age of Onset   Hypertension Mother    Healthy Father    Thyroid  disease Brother    Anesthesia problems Neg Hx    Other Neg Hx     Social History Social History   Tobacco Use   Smoking status: Never   Smokeless tobacco: Never  Vaping Use   Vaping status: Never  Used  Substance Use Topics   Alcohol use: Yes    Comment: occasionally   Drug use: Never     Allergies   Codeine and Ultram [tramadol]   Review of Systems Review of Systems   Physical Exam Triage Vital Signs ED Triage Vitals  Encounter Vitals Group     BP 06/09/24 1138 (!) 130/93     Girls Systolic BP Percentile --      Girls Diastolic BP Percentile --      Boys Systolic BP Percentile --      Boys Diastolic BP Percentile --      Pulse Rate 06/09/24 1138 85     Resp 06/09/24 1138 16     Temp 06/09/24 1138 98.9 F (37.2 C)     Temp Source 06/09/24 1138 Oral     SpO2 06/09/24 1138 98 %     Weight --      Height --      Head Circumference --      Peak Flow --      Pain Score 06/09/24 1139 4     Pain Loc --      Pain Education --      Exclude from Growth Chart --    No data found.  Updated Vital Signs BP (!) 130/93 (BP Location: Right Arm)   Pulse 85   Temp 98.9 F (37.2 C) (Oral)   Resp 16   LMP 05/11/2024 (Approximate)   SpO2 98%   Visual Acuity Right Eye Distance:   Left Eye Distance:   Bilateral Distance:    Right Eye Near:   Left Eye Near:    Bilateral Near:     Physical Exam Vitals reviewed.  Constitutional:      General: She is not in acute distress.    Appearance: She is not toxic-appearing.  Musculoskeletal:     Comments: There is some tenderness of both anterior knees on the lower poles.  There is little to swelling of the inferior anterior right knee.  Range of motion is normal.  Pulses are normal distally.  Sensation is intact.  Skin:    Coloration: Skin is not jaundiced or pale.  Neurological:     General: No focal deficit present.     Mental Status: She is alert and oriented to person, place, and time.  Psychiatric:        Behavior: Behavior normal.      UC Treatments / Results  Labs (all labs ordered are listed, but only abnormal results are displayed) Labs Reviewed - No data to  display  EKG   Radiology DG Knee Complete  4 Views Right Result Date: 06/09/2024 CLINICAL DATA:  fall, bilateral knee pain, swelling EXAM: RIGHT KNEE - COMPLETE 4+ VIEW COMPARISON:  None Available. FINDINGS: Right knee No acute fracture or dislocation. No joint effusion. There is no evidence of arthropathy or other focal bone abnormality. Soft tissue swelling about the superior aspect of the right knee. Left knee No acute fracture or dislocation. No joint effusion. There is no evidence of arthropathy or other focal bone abnormality. Soft tissues are unremarkable. IMPRESSION: Soft tissue swelling about the superior aspect of the right knee. Otherwise, no acute fracture or dislocation in the right or left knee. Electronically Signed   By: Rogelia Myers M.D.   On: 06/09/2024 12:22   DG Knee Complete 4 Views Left Result Date: 06/09/2024 CLINICAL DATA:  fall, bilateral knee pain, swelling EXAM: LEFT KNEE - COMPLETE 4+ VIEW COMPARISON:  None Available. FINDINGS: Right knee No acute fracture or dislocation. No joint effusion. There is no evidence of arthropathy or other focal bone abnormality. Soft tissue swelling about the superior aspect of the right knee. Left knee No acute fracture or dislocation. No joint effusion. There is no evidence of arthropathy or other focal bone abnormality. Soft tissues are unremarkable. IMPRESSION: Soft tissue swelling about the superior aspect of the right knee. Otherwise, no acute fracture or dislocation in the right or left knee. Electronically Signed   By: Rogelia Myers M.D.   On: 06/09/2024 12:21    Procedures Procedures (including critical care time)  Medications Ordered in UC Medications  ketorolac  (TORADOL ) 30 MG/ML injection 30 mg (has no administration in time range)    Initial Impression / Assessment and Plan / UC Course  I have reviewed the triage vital signs and the nursing notes.  Pertinent labs & imaging results that were available during my care of the patient were reviewed by me and considered  in my medical decision making (see chart for details).     X-rays are negative for fracture.  Knee sleeve brace is applied to the right knee since it is worse.  Toradol  injection is given here and Toradol  tablets are sent to the pharmacy. Final Clinical Impressions(s) / UC Diagnoses   Final diagnoses:  Acute bilateral knee pain     Discharge Instructions      There were no broken bones on your x-rays.  You have been given a shot of Toradol  30 mg today.  Ketorolac  10 mg tablets--take 1 tablet every 6 hours as needed for pain.  This is the same medicine that is in the shot we just gave you  Do not take ibuprofen  or naproxen  with the ketorolac .     ED Prescriptions     Medication Sig Dispense Auth. Provider   ketorolac  (TORADOL ) 10 MG tablet Take 1 tablet (10 mg total) by mouth every 6 (six) hours as needed (pain). 20 tablet Rachna Schonberger K, MD      PDMP not reviewed this encounter.   Vonna Sharlet POUR, MD 06/09/24 1239

## 2024-06-09 NOTE — ED Triage Notes (Signed)
 Patient here today with c/o bilat knee pain from falling yesterday morning. Patient states that her knees hit a metal bar. Her right knee hurts more than her left and has some swelling. Patient has increased pain with weightbearing and bending. Patient has taken Ibuprofen  and Hydrocodone  from a dental procedure with some relief. Patient has been icing them.

## 2024-06-09 NOTE — Discharge Instructions (Signed)
 There were no broken bones on your x-rays.  You have been given a shot of Toradol  30 mg today.  Ketorolac  10 mg tablets--take 1 tablet every 6 hours as needed for pain.  This is the same medicine that is in the shot we just gave you  Do not take ibuprofen  or naproxen  with the ketorolac .

## 2024-07-04 ENCOUNTER — Encounter (HOSPITAL_COMMUNITY): Payer: Self-pay

## 2024-07-04 ENCOUNTER — Ambulatory Visit (HOSPITAL_COMMUNITY)
Admission: RE | Admit: 2024-07-04 | Discharge: 2024-07-04 | Disposition: A | Discharge status: Home / Self Care | Payer: Self-pay | Source: Ambulatory Visit

## 2024-07-04 VITALS — BP 136/87 | HR 81 | Temp 99.8°F | Resp 16

## 2024-07-04 DIAGNOSIS — B349 Viral infection, unspecified: Secondary | ICD-10-CM

## 2024-07-04 LAB — POC COVID19/FLU A&B COMBO
Covid Antigen, POC: NEGATIVE
Influenza A Antigen, POC: NEGATIVE
Influenza B Antigen, POC: NEGATIVE

## 2024-07-04 LAB — POCT RAPID STREP A (OFFICE): Rapid Strep A Screen: NEGATIVE

## 2024-07-04 MED ORDER — PROMETHAZINE-DM 6.25-15 MG/5ML PO SYRP: 5.0000 mL | ORAL_SOLUTION | Freq: Four times a day (QID) | ORAL | 0 refills | Status: DC | PRN

## 2024-07-04 MED ORDER — AZELASTINE HCL 0.1 % NA SOLN: 1.0000 | Freq: Two times a day (BID) | NASAL | 1 refills | Status: AC

## 2024-07-04 NOTE — ED Provider Notes (Signed)
 [facility]  Note:  This document was prepared using Dragon voice recognition software and may include unintentional dictation errors.  MRN: [id number] DOB: 1976/01/20  Subjective:   Deanna Sparks is a 48 y.o. female presenting for fever/chills, nasal congestion, sore throat, body aches, fatigue, cough, headache x 2 to 3 days.  Patient has been using Alka-Seltzer cough and cold and ibuprofen  with some relief.  Patient reports that her brother was sick approximately 2 weeks ago but does not know what viral illness he was diagnosed with.  States she does live with her brother.  Patient denies any other known sick exposures.  Denies shortness of breath, chest pain, weakness, dizziness.  No current facility-administered medications for this encounter.  Current Outpatient Medications:    azelastine  (ASTELIN ) 0.1 % nasal spray, Place 1 spray into both nostrils 2 (two) times daily. Use in each nostril as directed, Disp: 30 mL, Rfl: 1   promethazine -dextromethorphan (PROMETHAZINE -DM) 6.25-15 MG/5ML syrup, Take 5 mLs by mouth 4 (four) times daily as needed., Disp: 118 mL, Rfl: 0   acyclovir  (ZOVIRAX ) 400 MG tablet, Take 1 tablet (400 mg total) by mouth 2 (two) times daily., Disp: 90 tablet, Rfl: 2   escitalopram  (LEXAPRO ) 10 MG tablet, Take 1 tablet (10 mg total) by mouth daily., Disp: 90 tablet, Rfl: 2   fluticasone  (FLONASE ) 50 MCG/ACT nasal spray, Place 1 spray into both nostrils daily., Disp: , Rfl:    lisinopril -hydrochlorothiazide  (ZESTORETIC ) 20-12.5 MG tablet, Take 1 tablet by mouth daily., Disp: 90 tablet, Rfl: 2   Allergies  Allergen Reactions   Codeine Itching   Ultram [Tramadol] Itching    Past Medical History:  Diagnosis Date   Genital herpes    last outbreak 12/2011   Hypertension    Vaginal Pap smear, abnormal      Past Surgical History:  Procedure Laterality Date   CESAREAN SECTION     COLPOSCOPY  10/03/2021   LIPOMA EXCISION  2008    removed from forehead    Family History  Problem Relation Age of Onset   Hypertension Mother    Healthy Father    Thyroid  disease Brother    Anesthesia problems Neg Hx    Other Neg Hx     Social History   Tobacco Use   Smoking status: Never   Smokeless tobacco: Never  Vaping Use   Vaping status: Never Used  Substance Use Topics   Alcohol use: Yes    Comment: occasionally   Drug use: Never    ROS Refer to HPI for ROS details.  Objective:    Vitals: BP 136/87 (BP Location: Left Arm)   Pulse 81   Temp 99.8 F (37.7 C) (Oral)   Resp 16   LMP 06/16/2024 (Approximate)   SpO2 97%   Physical Exam Vitals and nursing note reviewed.  Constitutional:      General: She is not in acute distress.    Appearance: Normal appearance. She is well-developed. She is not ill-appearing or toxic-appearing.  HENT:     Head: Normocephalic and atraumatic.     Nose: Congestion present. No rhinorrhea.     Mouth/Throat:     Mouth: Mucous membranes are moist.     Pharynx: Oropharynx is clear. No oropharyngeal exudate or posterior oropharyngeal erythema.  Cardiovascular:     Rate and Rhythm: Normal rate and regular rhythm.     Heart sounds: Normal heart sounds. No murmur heard. Pulmonary:     Effort: Pulmonary effort  is normal. No respiratory distress.     Breath sounds: Normal breath sounds. No stridor. No wheezing, rhonchi or rales.  Chest:     Chest wall: No tenderness.  Skin:    General: Skin is warm and dry.  Neurological:     General: No focal deficit present.     Mental Status: She is alert and oriented to person, place, and time.  Psychiatric:        Mood and Affect: Mood normal.        Behavior: Behavior normal.     Procedures  Results for orders placed or performed during the hospital encounter of 07/04/24 (from the past 24 hours)  POC rapid strep A     Status: None   Collection Time: 07/04/24  5:13 PM  Result Value Ref Range   Rapid Strep A Screen Negative  Negative  POC Covid19/Flu A&B Antigen     Status: None   Collection Time: 07/04/24  5:13 PM  Result Value Ref Range   Influenza A Antigen, POC Negative Negative   Influenza B Antigen, POC Negative Negative   Covid Antigen, POC Negative Negative    Assessment and Plan :     Discharge Instructions       1. Acute viral syndrome (Primary) - POC rapid strep A complete and UC is negative for strep pharyngitis - POC Covid19/Flu A&B Antigen complete in UC is negative for COVID and influenza - azelastine  (ASTELIN ) 0.1 % nasal spray; Place 1 spray into both nostrils 2 (two) times daily. Use in each nostril as directed  Dispense: 30 mL; Refill: 1 - promethazine -dextromethorphan (PROMETHAZINE -DM) 6.25-15 MG/5ML syrup; Take 5 mLs by mouth 4 (four) times daily as needed.  Dispense: 118 mL; Refill: 0 - Continue taking ibuprofen  600 mg every 6-8 hours as needed for body aches and fever secondary to viral illness. - Continue taking Alka-Seltzer cold and flu for acute viral symptoms. -Continue to monitor symptoms for any change in severity if there is any escalation of current symptoms or development of new symptoms follow-up in ER for further evaluation and management.      Travus Oren B Henrine Hayter   Saydee Zolman, [place], [place] 07/04/24 1733

## 2024-07-04 NOTE — Discharge Instructions (Signed)
  1. Acute viral syndrome (Primary) - POC rapid strep A complete and UC is negative for strep pharyngitis - POC Covid19/Flu A&B Antigen complete in UC is negative for COVID and influenza - azelastine  (ASTELIN ) 0.1 % nasal spray; Place 1 spray into both nostrils 2 (two) times daily. Use in each nostril as directed  Dispense: 30 mL; Refill: 1 - promethazine -dextromethorphan (PROMETHAZINE -DM) 6.25-15 MG/5ML syrup; Take 5 mLs by mouth 4 (four) times daily as needed.  Dispense: 118 mL; Refill: 0 - Continue taking ibuprofen  600 mg every 6-8 hours as needed for body aches and fever secondary to viral illness. - Continue taking Alka-Seltzer cold and flu for acute viral symptoms. -Continue to monitor symptoms for any change in severity if there is any escalation of current symptoms or development of new symptoms follow-up in ER for further evaluation and management.

## 2024-07-04 NOTE — ED Triage Notes (Signed)
 Patient here today with c/o ST, chills, nasal congestion, body aches, weakness, cough, and headache X 3 days. Patient has been taking Alka Seltzer and Ibuprofen  with some relief. 2 weeks ago her brother was sick. They live together.

## 2024-08-27 ENCOUNTER — Encounter: Payer: Medicaid Other | Admitting: Internal Medicine

## 2024-09-20 ENCOUNTER — Encounter (HOSPITAL_COMMUNITY): Payer: Self-pay

## 2024-09-20 ENCOUNTER — Ambulatory Visit (HOSPITAL_COMMUNITY)
Admission: RE | Admit: 2024-09-20 | Discharge: 2024-09-20 | Disposition: A | Discharge status: Home / Self Care | Payer: Self-pay | Source: Ambulatory Visit | Attending: Family Medicine | Admitting: Family Medicine

## 2024-09-20 VITALS — BP 134/87 | HR 80 | Temp 99.0°F | Resp 16

## 2024-09-20 DIAGNOSIS — R300 Dysuria: Secondary | ICD-10-CM | POA: Insufficient documentation

## 2024-09-20 DIAGNOSIS — N760 Acute vaginitis: Secondary | ICD-10-CM | POA: Insufficient documentation

## 2024-09-20 LAB — POCT URINALYSIS DIP (MANUAL ENTRY)
Bilirubin, UA: NEGATIVE
Glucose, UA: NEGATIVE mg/dL
Ketones, POC UA: NEGATIVE mg/dL
Leukocytes, UA: NEGATIVE
Nitrite, UA: NEGATIVE
Protein Ur, POC: NEGATIVE mg/dL
Spec Grav, UA: 1.025 (ref 1.010–1.025)
Urobilinogen, UA: 0.2 U/dL
pH, UA: 5.5 (ref 5.0–8.0)

## 2024-09-20 LAB — POCT URINE PREGNANCY: Preg Test, Ur: NEGATIVE

## 2024-09-20 MED ORDER — CLOTRIMAZOLE 1 % EX CREA: TOPICAL_CREAM | CUTANEOUS | 0 refills | Status: AC

## 2024-09-20 NOTE — Discharge Instructions (Addendum)
 We are sending out a urine culture and vaginal swab to further evaluate the cause of your symptoms. If change in your treatment plan is needed, we will contact you.  Use clotrimazole cream to external genitalia (labia) twice daily    Vaginitis refers to inflammation of the vagina, commonly caused by infections such as bacterial vaginosis, yeast (Candida) infections, or trichomoniasis. Symptoms may include vaginal discharge, itching, burning, irritation, and sometimes odor.  General Symptom Relief Measures - Keep the area clean and dry: Gently wash the vulva with warm water; avoid harsh soaps, douching, or scented products. - Wear breathable clothing: Choose cotton underwear and avoid tight-fitting pants to reduce moisture and irritation. - Avoid irritants: Steer clear of bubble baths, scented pads/tampons, and perfumed toilet paper. - Cool compresses: Applying a cool, damp cloth to the vulva can help relieve itching and discomfort. - Pain relief: Over-the-counter pain relievers (such as acetaminophen  or ibuprofen ) may help with discomfort. - Avoid sexual intercourse during active symptoms to prevent further irritation and transmission.  Role of Probiotics - Probiotics are beneficial bacteria, most commonly Lactobacillus species, that help maintain a healthy vaginal flora. - Potential benefits: Some studies suggest that oral or vaginal probiotics may help restore the natural balance of bacteria in the vagina, especially after antibiotic use or in cases of recurrent bacterial vaginosis or yeast infections. - Forms: Probiotics are available as oral supplements (capsules, tablets) and, less commonly, as vaginal suppositories. - Limitations: While some evidence supports the use of probiotics for prevention and adjunctive treatment, they are not a substitute for standard medical therapy in acute infections. Probiotics may be most helpful in reducing recurrence rates rather than treating active  infections. - Safety: Probiotics are generally considered safe for most people, but product quality can vary.  When to Seek Further Care - If symptoms persist, worsen, or recur frequently. - If there is severe pain, fever, or abnormal bleeding. - If over-the-counter treatments or home measures do not provide relief.

## 2024-09-20 NOTE — ED Provider Notes (Signed)
 [facility]    CSN: [id number] Arrival date & time: 09/20/24  9073      History   Chief Complaint Chief Complaint  Patient presents with   Urinary Frequency    Sometimes just drops burning - Entered by patient   Dysuria   Vaginal Itching   Abdominal Pain    HPI Deanna Sparks is a 48 y.o. female.   This-year-old presents today with complaint of vaginal itching and burning.  She reports that this began 5 days ago after using a new soap.  She reports that symptoms began with internal and external vaginal itching and irritation.  It has since progressed to include dysuria, frequency, urgency, and some suprapubic discomfort.  She denies fevers, chills, headaches, nausea, vomiting, hematuria, and change in vaginal discharge.  Since symptoms started she has since been washing her genitalia only with water.  She also took fluconazole  daily for 3 days beginning 5 days ago.  She reports that this was not helpful.  However she reports a history of burning with Monistat and is reluctant to use this.  She has never had a UTI before.  She has been monogamous for 5 years and has had no known exposure to STI.  However she would like to be tested for this today  Urinary Frequency Associated symptoms include abdominal pain.  Dysuria Associated symptoms: abdominal pain   Associated symptoms: no flank pain, no nausea, no vaginal discharge and no vomiting   Vaginal Itching Associated symptoms include abdominal pain.  Abdominal Pain Associated symptoms: dysuria   Associated symptoms: no diarrhea, no hematuria, no nausea, no vaginal bleeding, no vaginal discharge and no vomiting     Past Medical History:  Diagnosis Date   Genital herpes    last outbreak 12/2011   Hypertension    Vaginal Pap smear, abnormal     Patient Active Problem List   Diagnosis Date Noted   Other dysphagia 03/29/2024   Prediabetes 12/27/2023   Migraine 09/02/2023   Otalgia of both ears 07/16/2023    Sensation of fullness in both ears 07/16/2023   Seasonal allergic rhinitis 07/16/2023   Class 1 obesity due to excess calories with body mass index (BMI) of 32.0 to 32.9 in adult 07/16/2023   ASCUS with positive high risk HPV cervical 08/24/2021   Encounter for general adult medical examination w/o abnormal findings 08/24/2021   Anxiety and depression 06/22/2021   OSA on CPAP 06/22/2021   Sleeps in sitting position due to orthopnea 03/14/2021   Loud snoring 03/14/2021   Insufficient sleep syndrome 03/14/2021   Nocturia more than twice per night 03/14/2021   Caregiver stress syndrome 03/14/2021   Other insomnia 03/14/2021   Sleep apnea with cognitive complaints 03/14/2021   Multinodular goiter (nontoxic) 09/24/2020   GAD (generalized anxiety disorder) 05/29/2019   HSV infection 05/29/2019   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   Vitamin D  deficiency 05/28/2019   Essential hypertension 01/21/2019   Goiter 01/21/2019   Postnasal drip 01/21/2019   Plantar fasciitis 10/10/2018   Carpal tunnel syndrome 10/22/2015   Carpal tunnel syndrome, bilateral 10/22/2015   Preterm labor 04/03/2012    Past Surgical History:  Procedure Laterality Date   CESAREAN SECTION     COLPOSCOPY  10/03/2021   LIPOMA EXCISION  2008   removed from forehead    OB History     Gravida  3   Para  3   Term  2   Preterm  1   AB  0  Living  3      SAB  0   IAB  0   Ectopic  0   Multiple  0   Live Births  1            Home Medications    Prior to Admission medications   Medication Sig Start Date End Date Taking? Authorizing Provider  clotrimazole (LOTRIMIN) 1 % cream Apply to affected area 2 times daily 09/20/24  Yes Leatrice Vernell HERO, NP  acyclovir  (ZOVIRAX ) 400 MG tablet Take 1 tablet (400 mg total) by mouth 2 (two) times daily. 03/24/24   Jarold Medici, MD  azelastine  (ASTELIN ) 0.1 % nasal spray Place 1 spray into both nostrils 2 (two) times daily. Use in each nostril as directed Patient not  taking: Reported on 09/20/2024 07/04/24   Reddick, Johnathan B, NP  escitalopram  (LEXAPRO ) 10 MG tablet Take 1 tablet (10 mg total) by mouth daily. Patient not taking: Reported on 09/20/2024 12/27/23 12/26/24  Jarold Medici, MD  fluticasone  (FLONASE ) 50 MCG/ACT nasal spray Place 1 spray into both nostrils daily. 09/17/22   [provider]  lisinopril -hydrochlorothiazide  (ZESTORETIC ) 20-12.5 MG tablet Take 1 tablet by mouth daily. 09/04/23   Jarold Medici, MD  promethazine -dextromethorphan (PROMETHAZINE -DM) 6.25-15 MG/5ML syrup Take 5 mLs by mouth 4 (four) times daily as needed. Patient not taking: Reported on 09/20/2024 07/04/24   Aurea Ethel NOVAK, NP    Family History Family History  Problem Relation Age of Onset   Hypertension Mother    Healthy Father    Thyroid  disease Brother    Anesthesia problems Neg Hx    Other Neg Hx     Social History Social History   Tobacco Use   Smoking status: Never   Smokeless tobacco: Never  Vaping Use   Vaping status: Never Used  Substance Use Topics   Alcohol use: Yes    Comment: occasionally   Drug use: Never     Allergies   Codeine and Ultram [tramadol]   Review of Systems Review of Systems  Constitutional: Negative.   Gastrointestinal:  Positive for abdominal pain. Negative for diarrhea, nausea and vomiting.  Genitourinary:  Positive for dysuria, frequency, urgency and vaginal pain. Negative for decreased urine volume, difficulty urinating, dyspareunia, flank pain, genital sores, hematuria, menstrual problem, pelvic pain, vaginal bleeding and vaginal discharge.     Physical Exam Triage Vital Signs ED Triage Vitals  Encounter Vitals Group     BP 09/20/24 0941 134/87     Girls Systolic BP Percentile --      Girls Diastolic BP Percentile --      Boys Systolic BP Percentile --      Boys Diastolic BP Percentile --      Pulse Rate 09/20/24 0941 80     Resp 09/20/24 0941 16     Temp 09/20/24 0941 99 F (37.2 C)     Temp  Source 09/20/24 0941 Oral     SpO2 09/20/24 0941 99 %     Weight --      Height --      Head Circumference --      Peak Flow --      Pain Score 09/20/24 0940 6     Pain Loc --      Pain Education --      Exclude from Growth Chart --    No data found.  Updated Vital Signs BP 134/87 (BP Location: Left Arm)   Pulse 80   Temp 99 F (37.2  C) (Oral)   Resp 16   LMP 08/27/2024 (Approximate)   SpO2 99%   Visual Acuity Right Eye Distance:   Left Eye Distance:   Bilateral Distance:    Right Eye Near:   Left Eye Near:    Bilateral Near:     Physical Exam Vitals and nursing note reviewed.  Constitutional:      General: She is not in acute distress.    Appearance: Normal appearance. She is normal weight. She is not toxic-appearing.  Eyes:     Conjunctiva/sclera: Conjunctivae normal.  Cardiovascular:     Rate and Rhythm: Normal rate and regular rhythm.     Heart sounds: Normal heart sounds.  Pulmonary:     Effort: Pulmonary effort is normal.     Breath sounds: Normal breath sounds and air entry.  Abdominal:     General: Abdomen is flat. Bowel sounds are normal.     Palpations: Abdomen is soft.     Tenderness: There is no abdominal tenderness. There is no right CVA tenderness or left CVA tenderness.  Lymphadenopathy:     Cervical:     Right cervical: No posterior cervical adenopathy.    Left cervical: No posterior cervical adenopathy.  Skin:    General: Skin is warm and dry.  Neurological:     Mental Status: She is alert and oriented to person, place, and time.  Psychiatric:        Mood and Affect: Mood normal.        Behavior: Behavior normal.      UC Treatments / Results  Labs (all labs ordered are listed, but only abnormal results are displayed) Labs Reviewed  POCT URINALYSIS DIP (MANUAL ENTRY) - Abnormal; Notable for the following components:      Result Value   Blood, UA small (*)    All other components within normal limits  URINE CULTURE  POCT URINE  PREGNANCY  CERVICOVAGINAL ANCILLARY ONLY    EKG   Radiology No results found.  Procedures Procedures (including critical care time)  Medications Ordered in UC Medications - No data to display  Initial Impression / Assessment and Plan / UC Course  I have reviewed the triage vital signs and the nursing notes.  Pertinent labs & imaging results that were available during my care of the patient were reviewed by me and considered in my medical decision making (see chart for details).    Microscopic hematuria present on UA.  UA otherwise normal.  Will send urine out for culture to confirm.  However based on symptoms, likely candidiasis versus atypical BV.  Sending out NuSwab plus for confirmation.  Patient declines vaginal miconazole at this time.  Will send over clotrimazole for labia.  Will allow lab testing to direct further medication/management  Final Clinical Impressions(s) / UC Diagnoses   Final diagnoses:  Vaginitis and vulvovaginitis  Dysuria     Discharge Instructions      We are sending out a urine culture and vaginal swab to further evaluate the cause of your symptoms. If change in your treatment plan is needed, we will contact you.  Use clotrimazole cream to external genitalia (labia) twice daily    Vaginitis refers to inflammation of the vagina, commonly caused by infections such as bacterial vaginosis, yeast (Candida) infections, or trichomoniasis. Symptoms may include vaginal discharge, itching, burning, irritation, and sometimes odor.  General Symptom Relief Measures - Keep the area clean and dry: Gently wash the vulva with warm water; avoid harsh soaps, douching,  or scented products. - Wear breathable clothing: Choose cotton underwear and avoid tight-fitting pants to reduce moisture and irritation. - Avoid irritants: Steer clear of bubble baths, scented pads/tampons, and perfumed toilet paper. - Cool compresses: Applying a cool, damp cloth to the vulva can  help relieve itching and discomfort. - Pain relief: Over-the-counter pain relievers (such as acetaminophen  or ibuprofen ) may help with discomfort. - Avoid sexual intercourse during active symptoms to prevent further irritation and transmission.  Role of Probiotics - Probiotics are beneficial bacteria, most commonly Lactobacillus species, that help maintain a healthy vaginal flora. - Potential benefits: Some studies suggest that oral or vaginal probiotics may help restore the natural balance of bacteria in the vagina, especially after antibiotic use or in cases of recurrent bacterial vaginosis or yeast infections. - Forms: Probiotics are available as oral supplements (capsules, tablets) and, less commonly, as vaginal suppositories. - Limitations: While some evidence supports the use of probiotics for prevention and adjunctive treatment, they are not a substitute for standard medical therapy in acute infections. Probiotics may be most helpful in reducing recurrence rates rather than treating active infections. - Safety: Probiotics are generally considered safe for most people, but product quality can vary.  When to Seek Further Care - If symptoms persist, worsen, or recur frequently. - If there is severe pain, fever, or abnormal bleeding. - If over-the-counter treatments or home measures do not provide relief.     ED Prescriptions     Medication Sig Dispense Auth. Provider   clotrimazole  (LOTRIMIN ) 1 % cream Apply to affected area 2 times daily 15 g Leatrice Vernell HERO, NP      PDMP not reviewed this encounter.   Leatrice Vernell HERO, NP 09/20/24 1021

## 2024-09-20 NOTE — ED Triage Notes (Signed)
 Patient reports that she began having urinary frequency, vaginal irritation and itching 5 days ago. Patient states dysuria and lower abdominal pain that began today. Patient denies any vaginal discharge.  Patient denies using or taking any medications for her symptoms.

## 2024-09-21 LAB — URINE CULTURE: Culture: NO GROWTH

## 2024-09-22 LAB — CERVICOVAGINAL ANCILLARY ONLY
Bacterial Vaginitis (gardnerella): NEGATIVE
Candida Glabrata: NEGATIVE
Candida Vaginitis: NEGATIVE
Chlamydia: NEGATIVE
Comment: NEGATIVE
Comment: NEGATIVE
Comment: NEGATIVE
Comment: NEGATIVE
Comment: NEGATIVE
Comment: NORMAL
Neisseria Gonorrhea: NEGATIVE
Trichomonas: NEGATIVE

## 2024-09-23 ENCOUNTER — Ambulatory Visit (HOSPITAL_COMMUNITY): Payer: Self-pay

## 2024-10-03 ENCOUNTER — Encounter: Payer: Self-pay | Admitting: Internal Medicine

## 2024-10-06 ENCOUNTER — Telehealth: Payer: Self-pay | Admitting: Internal Medicine

## 2024-10-06 NOTE — Telephone Encounter (Signed)
 Called pt to see if she could come in tomorrow for an appt no answer left VM

## 2024-10-07 ENCOUNTER — Ambulatory Visit: Payer: Self-pay | Admitting: Internal Medicine

## 2024-10-07 ENCOUNTER — Encounter: Payer: Self-pay | Admitting: Internal Medicine

## 2024-10-08 ENCOUNTER — Ambulatory Visit: Payer: Self-pay

## 2024-10-13 ENCOUNTER — Encounter: Admitting: Internal Medicine

## 2024-10-13 NOTE — Patient Instructions (Incomplete)

## 2024-10-13 NOTE — Progress Notes (Deleted)
 ]I,Rayfield Beem T Chelsie Burel, CMA,acting as a neurosurgeon for [name], MD.,have documented all relevant documentation on the behalf of [name], MD,as directed by  [name], MD while in the presence of [name], MD.  Subjective:    Patient ID: Deanna Sparks , female    DOB: 11-22-1975 , 48 y.o.   MRN: [id number]  No chief complaint on file.   HPI  Patient presents today for a physical. Patient reports compliance with her meds.Patient is followed by GYN for her pelvic exams. Patient complains of not being able to sleep well. Patient reported she was taking sertraline  but she stopped taking it because it wasn't working.   Hypertension This is a chronic problem. The current episode started more than 1 year ago. The problem has been gradually improving since onset. The problem is controlled. Pertinent negatives include no anxiety or blurred vision. Past treatments include lifestyle changes. The current treatment provides moderate improvement.     Past Medical History:  Diagnosis Date   Genital herpes    last outbreak 12/2011   Hypertension    Vaginal Pap smear, abnormal      Family History  Problem Relation Age of Onset   Hypertension Mother    Healthy Father    Thyroid  disease Brother    Anesthesia problems Neg Hx    Other Neg Hx     Current Medications[1]   Allergies[2]    The patient states she uses {contraceptive methods:5051} for birth control. Patient's last menstrual period was 08/27/2024 (approximate).. {Dysmenorrhea-menorrhagia:21918}. Negative for: breast discharge, breast lump(s), breast pain and breast self exam. Associated symptoms include abnormal vaginal bleeding. Pertinent negatives include abnormal bleeding (hematology), anxiety, decreased libido, depression, difficulty falling sleep, dyspareunia, history of infertility, nocturia, sexual dysfunction, sleep disturbances, urinary incontinence, urinary urgency, vaginal discharge and vaginal itching.  Diet regular.The patient states her exercise level is    . The patient's tobacco use is: Tobacco Use History[3]. She has been exposed to passive smoke. The patient's alcohol use is:  Social History   Substance and Sexual Activity  Alcohol Use Yes   Comment: occasionally  . Additional information: Last pap ***, next one scheduled for ***.    Review of Systems  Constitutional: Negative.   HENT: Negative.    Eyes: Negative.  Negative for blurred vision.  Respiratory: Negative.    Cardiovascular: Negative.   Gastrointestinal: Negative.   Endocrine: Negative.   Genitourinary: Negative.   Musculoskeletal: Negative.   Skin: Negative.   Allergic/Immunologic: Negative.   Neurological: Negative.   Hematological: Negative.   Psychiatric/Behavioral: Negative.       There were no vitals filed for this visit. There is no height or weight on file to calculate BMI.  Wt Readings from Last 3 Encounters:  03/26/24 212 lb 6.4 oz (96.3 kg)  12/27/23 215 lb 12.8 oz (97.9 kg)  09/27/23 215 lb (97.5 kg)     Objective:  Physical Exam      Assessment And Plan:     Encounter for general adult medical examination w/o abnormal findings  Essential hypertension  Prediabetes  Other dysphagia  Goiter  Anxiety and depression     No follow-ups on file. Patient was given opportunity to ask questions. Patient verbalized understanding of the plan and was able to repeat key elements of the plan. All questions were answered to their satisfaction.   [name], MD  I, [name], MD, have reviewed all documentation for this visit. The  documentation on 10/13/2024 for the exam, diagnosis, procedures, and orders are all accurate and complete.     [1]  Current Outpatient Medications:    acyclovir  (ZOVIRAX ) 400 MG tablet, Take 1 tablet (400 mg total) by mouth 2 (two) times daily., Disp: 90 tablet, Rfl: 2   azelastine  (ASTELIN ) 0.1 % nasal spray, Place 1 spray into both nostrils 2 (two)  times daily. Use in each nostril as directed (Patient not taking: Reported on 09/20/2024), Disp: 30 mL, Rfl: 1   clotrimazole  (LOTRIMIN ) 1 % cream, Apply to affected area 2 times daily, Disp: 15 g, Rfl: 0   escitalopram  (LEXAPRO ) 10 MG tablet, Take 1 tablet (10 mg total) by mouth daily. (Patient not taking: Reported on 09/20/2024), Disp: 90 tablet, Rfl: 2   fluticasone  (FLONASE ) 50 MCG/ACT nasal spray, Place 1 spray into both nostrils daily., Disp: , Rfl:    lisinopril -hydrochlorothiazide  (ZESTORETIC ) 20-12.5 MG tablet, Take 1 tablet by mouth daily., Disp: 90 tablet, Rfl: 2   promethazine -dextromethorphan (PROMETHAZINE -DM) 6.25-15 MG/5ML syrup, Take 5 mLs by mouth 4 (four) times daily as needed. (Patient not taking: Reported on 09/20/2024), Disp: 118 mL, Rfl: 0 [2]  Allergies Allergen Reactions   Codeine Itching   Ultram [Tramadol] Itching  [3]  Social History Tobacco Use  Smoking Status Never  Smokeless Tobacco Never

## 2024-10-15 ENCOUNTER — Other Ambulatory Visit: Payer: Self-pay | Admitting: Family Medicine

## 2024-10-15 ENCOUNTER — Encounter: Payer: Self-pay | Admitting: Internal Medicine

## 2024-11-03 ENCOUNTER — Ambulatory Visit: Admitting: Orthopedic Surgery

## 2024-11-03 ENCOUNTER — Ambulatory Visit (INDEPENDENT_AMBULATORY_CARE_PROVIDER_SITE_OTHER): Admitting: Internal Medicine

## 2024-11-03 ENCOUNTER — Encounter: Payer: Self-pay | Admitting: Internal Medicine

## 2024-11-03 VITALS — BP 122/80 | HR 98 | Temp 98.3°F | Ht 68.0 in | Wt 215.8 lb

## 2024-11-03 DIAGNOSIS — R490 Dysphonia: Secondary | ICD-10-CM

## 2024-11-03 DIAGNOSIS — E66811 Obesity, class 1: Secondary | ICD-10-CM

## 2024-11-03 DIAGNOSIS — F419 Anxiety disorder, unspecified: Secondary | ICD-10-CM

## 2024-11-03 DIAGNOSIS — Z0000 Encounter for general adult medical examination without abnormal findings: Secondary | ICD-10-CM

## 2024-11-03 DIAGNOSIS — F32A Depression, unspecified: Secondary | ICD-10-CM | POA: Diagnosis not present

## 2024-11-03 DIAGNOSIS — E049 Nontoxic goiter, unspecified: Secondary | ICD-10-CM

## 2024-11-03 DIAGNOSIS — Z800 Family history of malignant neoplasm of digestive organs: Secondary | ICD-10-CM

## 2024-11-03 DIAGNOSIS — J029 Acute pharyngitis, unspecified: Secondary | ICD-10-CM

## 2024-11-03 DIAGNOSIS — I10 Essential (primary) hypertension: Secondary | ICD-10-CM | POA: Diagnosis not present

## 2024-11-03 DIAGNOSIS — E6609 Other obesity due to excess calories: Secondary | ICD-10-CM

## 2024-11-03 DIAGNOSIS — E559 Vitamin D deficiency, unspecified: Secondary | ICD-10-CM | POA: Diagnosis not present

## 2024-11-03 DIAGNOSIS — Z6832 Body mass index (BMI) 32.0-32.9, adult: Secondary | ICD-10-CM | POA: Diagnosis not present

## 2024-11-03 DIAGNOSIS — G4733 Obstructive sleep apnea (adult) (pediatric): Secondary | ICD-10-CM | POA: Diagnosis not present

## 2024-11-03 MED ORDER — ESCITALOPRAM OXALATE 10 MG PO TABS: ORAL_TABLET | ORAL | 2 refills | Status: AC

## 2024-11-03 NOTE — Assessment & Plan Note (Signed)

## 2024-11-03 NOTE — Patient Instructions (Signed)

## 2024-11-03 NOTE — Progress Notes (Signed)
 I,[name], CMA,acting as a neurosurgeon for [name], MD.,have documented all relevant documentation on the behalf of [name], MD,as directed by  [name], MD while in the presence of [name], MD.  Subjective:    Patient ID: Deanna Sparks , female    DOB: Mar 01, 1976 , 49 y.o.   MRN: [id number]  Chief Complaint  Patient presents with   Annual Exam    Patient presents today for annual exam. She reports compliance with medications. Denies headache, chest pain & sob. She reports noticing a irregular period. Very light flow, mild headaches, tender breasts. She does not know if this could be related to perimenopausal.  She also reports noticing not being able to breathe while asleep unless her mouth is open. She does have a sleep apnea machine but she has noticed her throat becoming very dry when she uses machine.  Sch for pap on Jan 22nd.   Hypertension   Prediabetes    HPI Discussed the use of AI scribe software for clinical note transcription with the patient, who gave verbal consent to proceed.  History of Present Illness Deanna Sparks is a 49 year old female who presents for a physical exam and concerns about CPAP use.  She has difficulty keeping her mouth closed while using her CPAP machine, leading to a very dry mouth and nasal passages upon waking. Despite using a chin strap, she feels unable to breathe with her mouth closed. She uses Flonase  and saline nasal sprays, but the dryness persists and has worsened over time. She has not informed her sleep specialist about these issues.  She is concerned about potential enlarged adenoids affecting her sleep, although she has no history of tonsil or adenoid issues or related surgeries. She continues to use Flonase  and saline nasal sprays to manage her symptoms.  She is currently taking lisinopril  and hydrochlorothiazide  for hypertension and escitalopram  for anxiety. She experiences significant drowsiness  from escitalopram , which she takes in the afternoon, leading her to occasionally stop the medication.  She reports snoring, which was a primary reason for obtaining the CPAP machine, despite being told her sleep study results did not indicate a strong need for it. Using the CPAP helps her feel refreshed and allows for unbroken sleep, although she wakes up to urinate twice a night. She has been skipping CPAP use due to the discomfort caused by dryness.  She mentions experiencing sore and tender breasts, which she attributes to nearing menopause. Her breasts used to get sore before her period, but now they are tender during her period. She has recently increased her caffeine intake.  She feels tired and has reduced her exercise routine, although she has started walking her dog more. She drinks about four bottles of water a day and experiences frequent heartburn, but rarely gets hoarse. She is up to date with her mammogram and has an appointment with her GYN next week.  No issues with swallowing, frequent sore throats, or issues with smell. She reports waking up to urinate twice a night and experiencing heartburn frequently.   Hypertension This is a chronic problem. The current episode started more than 1 year ago. The problem has been gradually improving since onset. The problem is controlled. Pertinent negatives include no anxiety or blurred vision. Past treatments include lifestyle changes. The current treatment provides moderate improvement.     Past Medical History:  Diagnosis Date   Genital herpes    last outbreak 12/2011  Hypertension    Vaginal Pap smear, abnormal      Family History  Problem Relation Age of Onset   Hypertension Mother    Healthy Father    Thyroid  disease Brother    Anesthesia problems Neg Hx    Other Neg Hx      Current Outpatient Medications:    acyclovir  (ZOVIRAX ) 400 MG tablet, Take 1 tablet (400 mg total) by mouth 2 (two) times daily., Disp: 90 tablet,  Rfl: 2   clotrimazole  (LOTRIMIN ) 1 % cream, Apply to affected area 2 times daily, Disp: 15 g, Rfl: 0   fluticasone  (FLONASE ) 50 MCG/ACT nasal spray, Place 1 spray into both nostrils daily., Disp: , Rfl:    lisinopril -hydrochlorothiazide  (ZESTORETIC ) 20-12.5 MG tablet, TAKE 1 TABLET BY MOUTH DAILY, Disp: 90 tablet, Rfl: 2   azelastine  (ASTELIN ) 0.1 % nasal spray, Place 1 spray into both nostrils 2 (two) times daily. Use in each nostril as directed (Patient not taking: Reported on 11/03/2024), Disp: 30 mL, Rfl: 1   escitalopram  (LEXAPRO ) 10 MG tablet, Take one-half tablet po qdaily, Disp: 45 tablet, Rfl: 2   Allergies  Allergen Reactions   Codeine Itching   Ultram [Tramadol] Itching      The patient states she uses none for birth control. No LMP recorded (exact date). (Menstrual status: Irregular Periods).. Negative for Dysmenorrhea. Negative for: breast discharge, breast lump(s), breast pain and breast self exam. Associated symptoms include abnormal vaginal bleeding. Pertinent negatives include abnormal bleeding (hematology), anxiety, decreased libido, depression, difficulty falling sleep, dyspareunia, history of infertility, nocturia, sexual dysfunction, sleep disturbances, urinary incontinence, urinary urgency, vaginal discharge and vaginal itching. Diet regular.The patient states her exercise level is    . The patient's tobacco use is: Tobacco Use History[1]. She has been exposed to passive smoke. The patient's alcohol use is:  Social History   Substance and Sexual Activity  Alcohol Use Yes   Comment: occasionally    Review of Systems  Constitutional: Negative.   HENT: Negative.    Eyes: Negative.  Negative for blurred vision.  Respiratory: Negative.    Cardiovascular: Negative.   Gastrointestinal: Negative.   Endocrine: Negative.   Genitourinary: Negative.   Musculoskeletal: Negative.   Skin: Negative.   Allergic/Immunologic: Negative.   Neurological: Negative.   Hematological:  Negative.   Psychiatric/Behavioral: Negative.       Today's Vitals   11/03/24 1504  BP: 122/80  Pulse: 98  Temp: 98.3 F (36.8 C)  SpO2: 98%  Weight: 215 lb 12.8 oz (97.9 kg)  Height: 5' 8 (1.727 m)   Body mass index is 32.81 kg/m.  Wt Readings from Last 3 Encounters:  11/03/24 215 lb 12.8 oz (97.9 kg)  03/26/24 212 lb 6.4 oz (96.3 kg)  12/27/23 215 lb 12.8 oz (97.9 kg)     Objective:  Physical Exam Vitals and nursing note reviewed.  Constitutional:      Appearance: Normal appearance. She is obese.  HENT:     Head: Normocephalic and atraumatic.     Right Ear: Tympanic membrane, ear canal and external ear normal.     Left Ear: Tympanic membrane, ear canal and external ear normal.     Nose: Nose normal.     Mouth/Throat:     Mouth: Mucous membranes are moist.     Pharynx: Oropharynx is clear.  Eyes:     Extraocular Movements: Extraocular movements intact.     Conjunctiva/sclera: Conjunctivae normal.     Pupils: Pupils are equal, round, and reactive  to light.  Cardiovascular:     Rate and Rhythm: Normal rate and regular rhythm.     Pulses: Normal pulses.     Heart sounds: Normal heart sounds.  Pulmonary:     Effort: Pulmonary effort is normal.     Breath sounds: Normal breath sounds.  Chest:  Breasts:    Tanner Score is 5.     Right: Normal.     Left: Normal.  Abdominal:     General: Bowel sounds are normal.     Palpations: Abdomen is soft.     Comments: Obese, soft  Genitourinary:    Comments: deferred Musculoskeletal:        General: Normal range of motion.     Cervical back: Normal range of motion and neck supple.  Skin:    General: Skin is warm and dry.  Neurological:     General: No focal deficit present.     Mental Status: She is alert and oriented to person, place, and time.  Psychiatric:        Mood and Affect: Mood normal.        Behavior: Behavior normal.       Assessment And Plan:     Encounter for general adult medical examination w/o  abnormal findings Assessment & Plan: A full exam was performed.  Importance of monthly self breast exams was discussed with the patient.  She is advised to get 30-45 minutes of regular exercise, no less than four to five days per week. Both weight-bearing and aerobic exercises are recommended.  She is advised to follow a healthy diet with at least six fruits/veggies per day, decrease intake of red meat and other saturated fats and to increase fish intake to twice weekly.  Meats/fish should not be fried -- baked, boiled or broiled is preferable. It is also important to cut back on your sugar intake.  Be sure to read labels - try to avoid anything with added sugar, high fructose corn syrup or other sweeteners.  If you must use a sweetener, you can try stevia or monkfruit.  It is also important to avoid artificially sweetened foods/beverages and diet drinks. Lastly, wear SPF 50 sunscreen on exposed skin and when in direct sunlight for an extended period of time.  Be sure to avoid fast food restaurants and aim for at least 60 ounces of water daily.      Orders: -     CBC -     CMP14+EGFR -     Lipid panel -     Hemoglobin A1c -     TSH  Essential hypertension Assessment & Plan: Chronic, fair control.  Goal BP <130/80.  EKG performed, NSR w/o acute changes.  Hypertension controlled with lisinopril  and hydrochlorothiazide . Reports fluid retention without observed swelling. Blood pressure readings low. - Continue lisinopril  and hydrochlorothiazide . - Follow low sodium diet.  - Encouraged to incorporate more exercise into her daily routine.  - Follow up in six months for re-evaluation.   Orders: -     Microalbumin / creatinine urine ratio -     EKG 12-Lead -     Urinalysis, Complete  OSA on CPAP Assessment & Plan: Chronic obstructive sleep apnea with CPAP intolerance due to mouth breathing and dryness. Persistent snoring despite CPAP. No tonsil or adenoid issues. Surgical options considered for  nasal obstruction. - Use Flonase  nasal spray. - Consider ENT referral for nasal obstruction evaluation and potential surgery.  Orders: -     Ambulatory referral  to ENT  Anxiety and depression Assessment & Plan: Managed with escitalopram . Significant drowsiness led to discontinuation and intermittent use. Prefers escitalopram  despite side effects. - Prescribed half a pill of 10mg  escitalopram . - Advised taking escitalopram  at dinner   Vitamin D  deficiency disease Assessment & Plan: Potential vitamin D  deficiency contributing to fatigue and decreased exercise tolerance. - Ordered vitamin D  level.  Orders: -     VITAMIN D  25 Hydroxy (Vit-D Deficiency, Fractures)  Hoarseness of voice -     Ambulatory referral to ENT  Family history of colon cancer Assessment & Plan: Maternal grandmother had colon cancer.  She declines colonoscopy at this time.  - Prefers Cologuard, due May 2026.    Class 1 obesity due to excess calories with serious comorbidity and body mass index (BMI) of 32.0 to 32.9 in adult Assessment & Plan: She is encouraged to strive for BMI less than 30 to decrease cardiac risk. Advised to aim for at least 150 minutes of exercise per week.    Other orders -     Escitalopram  Oxalate; Take one-half tablet po qdaily  Dispense: 45 tablet; Refill: 2 -     Microscopic Examination  General Health Maintenance Up to date with mammogram. Reports breast tenderness, possibly menopause-related. Increased caffeine intake noted. - Recommended vitamin E for breast tenderness. - Encouraged strength training exercises. - Advised on hydration and caffeine monitoring.  Return for 1 year physical, 6 month bp. Patient was given opportunity to ask questions. Patient verbalized understanding of the plan and was able to repeat key elements of the plan. All questions were answered to their satisfaction.   I, [name], MD, have reviewed all documentation for this visit. The  documentation on 11/03/2024 for the exam, diagnosis, procedures, and orders are all accurate and complete.      [1]  Social History Tobacco Use  Smoking Status Never  Smokeless Tobacco Never

## 2024-11-04 LAB — VITAMIN D 25 HYDROXY (VIT D DEFICIENCY, FRACTURES): Vit D, 25-Hydroxy: 26.6 ng/mL — ABNORMAL LOW (ref 30.0–100.0)

## 2024-11-04 LAB — TSH: TSH: 1.19 u[IU]/mL (ref 0.450–4.500)

## 2024-11-04 LAB — CMP14+EGFR
ALT: 24 IU/L (ref 0–32)
AST: 16 IU/L (ref 0–40)
Albumin: 4.1 g/dL (ref 3.9–4.9)
Alkaline Phosphatase: 66 IU/L (ref 41–116)
BUN/Creatinine Ratio: 16 (ref 9–23)
BUN: 13 mg/dL (ref 6–24)
Bilirubin Total: 0.5 mg/dL (ref 0.0–1.2)
CO2: 25 mmol/L (ref 20–29)
Calcium: 9.7 mg/dL (ref 8.7–10.2)
Chloride: 103 mmol/L (ref 96–106)
Creatinine, Ser: 0.83 mg/dL (ref 0.57–1.00)
Globulin, Total: 2.7 g/dL (ref 1.5–4.5)
Glucose: 80 mg/dL (ref 70–99)
Potassium: 4.5 mmol/L (ref 3.5–5.2)
Sodium: 141 mmol/L (ref 134–144)
Total Protein: 6.8 g/dL (ref 6.0–8.5)
eGFR: 86 mL/min/1.73

## 2024-11-04 LAB — URINALYSIS, COMPLETE
Bilirubin, UA: NEGATIVE
Glucose, UA: NEGATIVE
Nitrite, UA: NEGATIVE
Specific Gravity, UA: 1.028 (ref 1.005–1.030)
Urobilinogen, Ur: 1 mg/dL (ref 0.2–1.0)
pH, UA: 7.5 (ref 5.0–7.5)

## 2024-11-04 LAB — MICROALBUMIN / CREATININE URINE RATIO
Creatinine, Urine: 244 mg/dL
Microalb/Creat Ratio: 9 mg/g{creat} (ref 0–29)
Microalbumin, Urine: 21.1 ug/mL

## 2024-11-04 LAB — CBC
Hematocrit: 43.7 % (ref 34.0–46.6)
Hemoglobin: 14.4 g/dL (ref 11.1–15.9)
MCH: 30.3 pg (ref 26.6–33.0)
MCHC: 33 g/dL (ref 31.5–35.7)
MCV: 92 fL (ref 79–97)
Platelets: 324 x10E3/uL (ref 150–450)
RBC: 4.76 x10E6/uL (ref 3.77–5.28)
RDW: 12.6 % (ref 11.7–15.4)
WBC: 12.2 x10E3/uL — ABNORMAL HIGH (ref 3.4–10.8)

## 2024-11-04 LAB — HEMOGLOBIN A1C
Est. average glucose Bld gHb Est-mCnc: 108 mg/dL
Hgb A1c MFr Bld: 5.4 % (ref 4.8–5.6)

## 2024-11-04 LAB — LIPID PANEL
Chol/HDL Ratio: 3.2 ratio (ref 0.0–4.4)
Cholesterol, Total: 192 mg/dL (ref 100–199)
HDL: 60 mg/dL
LDL Chol Calc (NIH): 107 mg/dL — ABNORMAL HIGH (ref 0–99)
Triglycerides: 142 mg/dL (ref 0–149)
VLDL Cholesterol Cal: 25 mg/dL (ref 5–40)

## 2024-11-04 LAB — MICROSCOPIC EXAMINATION
Bacteria, UA: NONE SEEN
Casts: NONE SEEN /LPF
RBC, Urine: >30 /HPF — AB (ref 0–2)

## 2024-11-06 ENCOUNTER — Ambulatory Visit: Payer: Self-pay | Admitting: Internal Medicine

## 2024-11-08 ENCOUNTER — Encounter: Payer: Self-pay | Admitting: Internal Medicine

## 2024-11-08 DIAGNOSIS — Z800 Family history of malignant neoplasm of digestive organs: Secondary | ICD-10-CM | POA: Insufficient documentation

## 2024-11-08 NOTE — Assessment & Plan Note (Signed)
 Chronic obstructive sleep apnea with CPAP intolerance due to mouth breathing and dryness. Persistent snoring despite CPAP. No tonsil or adenoid issues. Surgical options considered for nasal obstruction. - Use Flonase  nasal spray. - Consider ENT referral for nasal obstruction evaluation and potential surgery.

## 2024-11-08 NOTE — Assessment & Plan Note (Signed)
 Maternal grandmother had colon cancer.  She declines colonoscopy at this time.  - Prefers Cologuard, due May 2026.

## 2024-11-08 NOTE — Assessment & Plan Note (Signed)
 Potential vitamin D  deficiency contributing to fatigue and decreased exercise tolerance. - Ordered vitamin D  level.

## 2024-11-08 NOTE — Assessment & Plan Note (Addendum)
 Chronic, fair control.  Goal BP <130/80.  EKG performed, NSR w/o acute changes.  Hypertension controlled with lisinopril  and hydrochlorothiazide . Reports fluid retention without observed swelling. Blood pressure readings low. - Continue lisinopril  and hydrochlorothiazide . - Follow low sodium diet.  - Encouraged to incorporate more exercise into her daily routine.  - Follow up in six months for re-evaluation.

## 2024-11-08 NOTE — Assessment & Plan Note (Signed)
 Managed with escitalopram . Significant drowsiness led to discontinuation and intermittent use. Prefers escitalopram  despite side effects. - Prescribed half a pill of 10mg  escitalopram . - Advised taking escitalopram  at dinner

## 2024-11-08 NOTE — Assessment & Plan Note (Signed)
 She is encouraged to strive for BMI less than 30 to decrease cardiac risk. Advised to aim for at least 150 minutes of exercise per week.

## 2024-11-10 ENCOUNTER — Encounter: Payer: Self-pay | Admitting: Internal Medicine

## 2024-11-10 ENCOUNTER — Other Ambulatory Visit: Payer: Self-pay | Admitting: Internal Medicine

## 2024-11-10 MED ORDER — NITROFURANTOIN MONOHYD MACRO 100 MG PO CAPS: 100.0000 mg | ORAL_CAPSULE | Freq: Two times a day (BID) | ORAL | 0 refills | Status: AC

## 2024-11-11 ENCOUNTER — Ambulatory Visit (HOSPITAL_COMMUNITY): Payer: Self-pay

## 2024-11-13 ENCOUNTER — Other Ambulatory Visit: Payer: Self-pay

## 2024-11-13 ENCOUNTER — Other Ambulatory Visit (HOSPITAL_COMMUNITY)
Admission: RE | Admit: 2024-11-13 | Discharge: 2024-11-13 | Disposition: A | Source: Ambulatory Visit | Attending: Student | Admitting: Student

## 2024-11-13 ENCOUNTER — Encounter: Payer: Self-pay | Admitting: Student

## 2024-11-13 ENCOUNTER — Ambulatory Visit: Payer: Self-pay | Admitting: Student

## 2024-11-13 VITALS — BP 160/111 | HR 73 | Wt 213.0 lb

## 2024-11-13 DIAGNOSIS — R87810 Cervical high risk human papillomavirus (HPV) DNA test positive: Secondary | ICD-10-CM | POA: Diagnosis not present

## 2024-11-13 DIAGNOSIS — R87610 Atypical squamous cells of undetermined significance on cytologic smear of cervix (ASC-US): Secondary | ICD-10-CM

## 2024-11-13 DIAGNOSIS — R102 Pelvic and perineal pain unspecified side: Secondary | ICD-10-CM

## 2024-11-13 DIAGNOSIS — I10 Essential (primary) hypertension: Secondary | ICD-10-CM | POA: Diagnosis not present

## 2024-11-13 DIAGNOSIS — Z01419 Encounter for gynecological examination (general) (routine) without abnormal findings: Secondary | ICD-10-CM

## 2024-11-13 DIAGNOSIS — Z87898 Personal history of other specified conditions: Secondary | ICD-10-CM

## 2024-11-13 LAB — POCT URINALYSIS DIP (DEVICE)
Glucose, UA: NEGATIVE mg/dL
Ketones, ur: NEGATIVE mg/dL
Leukocytes,Ua: NEGATIVE
Nitrite: NEGATIVE
Protein, ur: NEGATIVE mg/dL
Specific Gravity, Urine: 1.02 (ref 1.005–1.030)
Urobilinogen, UA: 0.2 mg/dL (ref 0.0–1.0)
pH: 5.5 (ref 5.0–8.0)

## 2024-11-13 NOTE — Progress Notes (Signed)
 "  Subjective:     Deanna Sparks is a 49 y.o. female here at [facility] for a routine annual exam.  Current complaints: urinary frequency, urgency, low back pain, suprapubic pain.  Patient reports that she started having urgency and frequency in 08/2024. She was seen in the ED at that time and UA was negative. Patient states that she took leftover amoxicillin  from a dental procedure and her urinary symptoms somewhat improved after that but did not resolve. Her symptoms have worsened over the past one month, and she is now having low back pain, suprapubic pain, some intermittent incontinence, and low volume voids. She has been taking ibuprofen  without relief. She was seen by her PCP on 11/03/24 and UA showed blood but no infection. Patient was still having symptoms and was started on Macrobid  on 11/10/24. She has been taking it as prescribed and states that her symptoms have not improved. She has had irregular menstrual cycles and breast tenderness for the past two years but does have monthly cycles. No vasomotor symptoms. She reports some vaginal spotting vs hematuria over the past few days. No changes in bowel habits. Denies breast changes. Personal and family health history reviewed: yes.  Do you have a primary care provider? Yes  Flowsheet Row Office Visit from [facility]/13/20[facility] in [facility]  PHQ-2 Total Score 0    Health Maintenance Due  Topic Date Due   Hepatitis B Vaccines 19-59 Average Risk (1 of 3 - 19+ 3-dose series) Never done   Cervical Cancer Screening (Pap smear)  08/25/2023   COVID-19 Vaccine (3 - 2025-26 season) 06/23/2024    Risk factors for chronic health problems: Smoking: Alchohol/how much: Pt BMI: Body mass index is 32.39 kg/m.   Gynecologic History Patient's last menstrual period was 10/28/2024. Contraception: none, not interested in contraception Last Pap: 08/24/2022. Results were: normal. History of ASCUS with positive HRHPV in  08/2021 s/p colpo in 09/2021.  Last mammogram: 03/20/2024. Results were: normal  Obstetric History OB History  Gravida Para Term Preterm AB Living  3 3 2 1  0 3  SAB IAB Ectopic Multiple Live Births  0 0 0 0 1    # Outcome Date GA Lbr Len/2nd Weight Sex Type Anes PTL Lv  3 Preterm 04/29/12 [redacted]w[redacted]d 27:56 / 00:04 4 lb 1.5 oz (1.857 kg) M VBAC EPI  LIV     Birth Comments: preterm  2 Term 39     CS-LTranv     1 Term 21     Vag-Spont        Birth Comments: c/section b/c of infected uterus    The following portions of the patient's history were reviewed and updated as appropriate: allergies, current medications, past family history, past medical history, past social history, past surgical history, and problem list.  Review of Systems Pertinent items are noted in HPI.    Objective:   Today's Vitals   11/13/24 0836 11/13/24 0905  BP: (!) 148/98 (!) 160/111  Pulse: 77 73  Weight: 213 lb (96.6 kg)    Body mass index is 32.39 kg/m.  VS reviewed, nursing note reviewed,  Constitutional: well developed, well nourished, no distress HEENT: normocephalic HEART: Regular rate RESP: Normal respiratory effort, no respiratory distress Breast Exam:  Deferred with low risks and shared decision making, discussed recommendation to start mammogram between 76-50 yo Abdomen: soft, no CVA tenderness Neuro: alert and oriented x 3 Skin: warm, dry Psych: affect normal Pelvic exam:  Performed: Cervix pink, visually closed, without lesion, scant white creamy discharge, vaginal walls and external genitalia normal Bimanual exam: Cervix 0/long/high, firm, anterior, neg CMT, uterus nontender, nonenlarged, adnexa without tenderness, enlargement, or mass      Assessment/Plan:   Encounter for annual routine gynecological examination -Pap obtained today.  -If results are normal, patient can follow-up in 3 years for repeat Pap per ASCCP guidelines.  -Mammogram ordered for 02/2025. Encouraged self examination.    History of dysuria -Urine culture and STI testing obtained today.  -Pending results, consider UroGyn referral vs PCP follow-up.  Chronic hypertension -BP elevated in office today. Patient has not taken her lisinopril -hydrochlorothiazide  yet today. Encouraged medication adherence and PCP follow-up.  -Recommend Tylenol  for pain control rather than ibuprofen .   No follow-ups on file.   Franchot Budge, Student-PA 9:46 AM   "

## 2024-11-13 NOTE — Addendum Note (Signed)
 Addended by: JERILYNN LONGS B on: 11/13/2024 12:04 PM   Modules accepted: Orders

## 2024-11-14 ENCOUNTER — Encounter: Payer: Self-pay | Admitting: Family Medicine

## 2024-11-14 LAB — CYTOLOGY - PAP
Chlamydia: NEGATIVE
Comment: NEGATIVE
Comment: NEGATIVE
Comment: NEGATIVE
Comment: NORMAL
Diagnosis: NEGATIVE
Diagnosis: REACTIVE
High risk HPV: NEGATIVE
Neisseria Gonorrhea: NEGATIVE
Trichomonas: NEGATIVE

## 2024-11-14 LAB — RPR+HBSAG+HCVAB+...
HIV Screen 4th Generation wRfx: NONREACTIVE
Hep C Virus Ab: NONREACTIVE
Hepatitis B Surface Ag: NEGATIVE
RPR Ser Ql: NONREACTIVE

## 2024-11-15 LAB — URINE CULTURE

## 2024-11-18 ENCOUNTER — Ambulatory Visit (HOSPITAL_COMMUNITY): Payer: Self-pay | Admitting: Student

## 2024-11-20 ENCOUNTER — Encounter: Payer: Self-pay | Admitting: Internal Medicine

## 2024-11-24 ENCOUNTER — Ambulatory Visit (HOSPITAL_COMMUNITY)

## 2024-11-26 ENCOUNTER — Other Ambulatory Visit: Payer: Self-pay

## 2024-11-26 ENCOUNTER — Encounter: Payer: Self-pay | Admitting: Internal Medicine

## 2024-11-26 MED ORDER — ACYCLOVIR 400 MG PO TABS: 400.0000 mg | ORAL_TABLET | Freq: Two times a day (BID) | ORAL | 1 refills | Status: AC

## 2024-12-10 ENCOUNTER — Institutional Professional Consult (permissible substitution) (INDEPENDENT_AMBULATORY_CARE_PROVIDER_SITE_OTHER)

## 2024-12-17 ENCOUNTER — Encounter: Payer: Self-pay | Admitting: Internal Medicine

## 2025-01-05 ENCOUNTER — Institutional Professional Consult (permissible substitution) (INDEPENDENT_AMBULATORY_CARE_PROVIDER_SITE_OTHER)

## 2025-02-02 ENCOUNTER — Encounter: Payer: Self-pay | Admitting: Internal Medicine

## 2025-02-25 ENCOUNTER — Encounter: Admitting: Internal Medicine

## 2025-05-04 ENCOUNTER — Ambulatory Visit: Payer: Self-pay | Admitting: Internal Medicine

## 2025-11-04 ENCOUNTER — Encounter: Payer: Self-pay | Admitting: Internal Medicine
